# Patient Record
Sex: Female | Born: 1960 | Race: White | Hispanic: No | Marital: Married | Smoking: Former smoker
Health system: Southern US, Community
[De-identification: ages and names within clinical notes are randomized; demographics above are authoritative.]

## PROBLEM LIST (undated history)

## (undated) DIAGNOSIS — C801 Malignant (primary) neoplasm, unspecified: Secondary | ICD-10-CM

## (undated) DIAGNOSIS — R1030 Lower abdominal pain, unspecified: Secondary | ICD-10-CM

## (undated) DIAGNOSIS — Z87442 Personal history of urinary calculi: Secondary | ICD-10-CM

## (undated) DIAGNOSIS — K219 Gastro-esophageal reflux disease without esophagitis: Secondary | ICD-10-CM

## (undated) DIAGNOSIS — F419 Anxiety disorder, unspecified: Secondary | ICD-10-CM

## (undated) DIAGNOSIS — M069 Rheumatoid arthritis, unspecified: Secondary | ICD-10-CM

## (undated) DIAGNOSIS — R399 Unspecified symptoms and signs involving the genitourinary system: Secondary | ICD-10-CM

## (undated) HISTORY — PX: CHOLECYSTECTOMY: SHX55

## (undated) HISTORY — PX: ABDOMINAL HYSTERECTOMY: SHX81

## (undated) HISTORY — DX: Rheumatoid arthritis, unspecified: M06.9

## (undated) HISTORY — PX: REVISION UROSTOMY CUTANEOUS: SUR1282

---

## 2014-05-13 ENCOUNTER — Emergency Department
Admit: 2014-05-13 | Disposition: A | Discharge status: Home / Self Care | Payer: Self-pay | Admitting: Emergency Medicine

## 2014-05-13 LAB — COMPREHENSIVE METABOLIC PANEL
ANION GAP: 9 (ref 7–16)
Albumin: 4.7 g/dL
Alkaline Phosphatase: 100 U/L
BILIRUBIN TOTAL: 0.4 mg/dL
BUN: 13 mg/dL
CALCIUM: 9.7 mg/dL
CHLORIDE: 101 mmol/L
CREATININE: 0.74 mg/dL
Co2: 32 mmol/L
EGFR (Non-African Amer.): >60
Glucose: 129 mg/dL — ABNORMAL HIGH
POTASSIUM: 3.8 mmol/L
SGOT(AST): 17 U/L
SGPT (ALT): 14 U/L
Sodium: 142 mmol/L
Total Protein: 7.8 g/dL

## 2014-05-13 LAB — URINALYSIS, COMPLETE
BILIRUBIN, UR: NEGATIVE
Blood: NEGATIVE
Glucose,UR: NEGATIVE mg/dL (ref 0–75)
Leukocyte Esterase: NEGATIVE
Nitrite: NEGATIVE
Ph: 9 (ref 4.5–8.0)
Protein: 30
Specific Gravity: 1.018 (ref 1.003–1.030)

## 2014-05-13 LAB — CBC
HCT: 46.3 % (ref 35.0–47.0)
HGB: 15.8 g/dL (ref 12.0–16.0)
MCH: 29.9 pg (ref 26.0–34.0)
MCHC: 34 g/dL (ref 32.0–36.0)
MCV: 88 fL (ref 80–100)
PLATELETS: 207 10*3/uL (ref 150–440)
RBC: 5.26 10*6/uL — AB (ref 3.80–5.20)
RDW: 13.8 % (ref 11.5–14.5)
WBC: 6.9 10*3/uL (ref 3.6–11.0)

## 2014-05-13 LAB — LIPASE, BLOOD: Lipase: 32 U/L

## 2015-11-11 DIAGNOSIS — M059 Rheumatoid arthritis with rheumatoid factor, unspecified: Secondary | ICD-10-CM | POA: Insufficient documentation

## 2015-11-11 DIAGNOSIS — Z79899 Other long term (current) drug therapy: Secondary | ICD-10-CM | POA: Insufficient documentation

## 2016-02-17 DIAGNOSIS — M059 Rheumatoid arthritis with rheumatoid factor, unspecified: Secondary | ICD-10-CM | POA: Diagnosis not present

## 2016-02-17 DIAGNOSIS — Z79899 Other long term (current) drug therapy: Secondary | ICD-10-CM | POA: Diagnosis not present

## 2016-03-12 DIAGNOSIS — Z79899 Other long term (current) drug therapy: Secondary | ICD-10-CM | POA: Diagnosis not present

## 2016-03-12 DIAGNOSIS — M059 Rheumatoid arthritis with rheumatoid factor, unspecified: Secondary | ICD-10-CM | POA: Diagnosis not present

## 2016-04-29 DIAGNOSIS — N390 Urinary tract infection, site not specified: Secondary | ICD-10-CM | POA: Diagnosis not present

## 2016-04-29 DIAGNOSIS — N202 Calculus of kidney with calculus of ureter: Secondary | ICD-10-CM | POA: Diagnosis not present

## 2016-06-14 DIAGNOSIS — Z79899 Other long term (current) drug therapy: Secondary | ICD-10-CM | POA: Diagnosis not present

## 2016-06-14 DIAGNOSIS — M059 Rheumatoid arthritis with rheumatoid factor, unspecified: Secondary | ICD-10-CM | POA: Diagnosis not present

## 2016-09-13 DIAGNOSIS — Z79899 Other long term (current) drug therapy: Secondary | ICD-10-CM | POA: Diagnosis not present

## 2016-09-13 DIAGNOSIS — M059 Rheumatoid arthritis with rheumatoid factor, unspecified: Secondary | ICD-10-CM | POA: Diagnosis not present

## 2016-09-27 DIAGNOSIS — R748 Abnormal levels of other serum enzymes: Secondary | ICD-10-CM | POA: Diagnosis not present

## 2016-10-15 DIAGNOSIS — Z79899 Other long term (current) drug therapy: Secondary | ICD-10-CM | POA: Diagnosis not present

## 2016-10-15 DIAGNOSIS — M059 Rheumatoid arthritis with rheumatoid factor, unspecified: Secondary | ICD-10-CM | POA: Diagnosis not present

## 2016-12-28 DIAGNOSIS — M059 Rheumatoid arthritis with rheumatoid factor, unspecified: Secondary | ICD-10-CM | POA: Diagnosis not present

## 2016-12-28 DIAGNOSIS — Z79899 Other long term (current) drug therapy: Secondary | ICD-10-CM | POA: Diagnosis not present

## 2017-01-14 DIAGNOSIS — M059 Rheumatoid arthritis with rheumatoid factor, unspecified: Secondary | ICD-10-CM | POA: Diagnosis not present

## 2017-01-14 DIAGNOSIS — Z79899 Other long term (current) drug therapy: Secondary | ICD-10-CM | POA: Diagnosis not present

## 2017-03-18 DIAGNOSIS — M059 Rheumatoid arthritis with rheumatoid factor, unspecified: Secondary | ICD-10-CM | POA: Diagnosis not present

## 2017-03-18 DIAGNOSIS — Z79899 Other long term (current) drug therapy: Secondary | ICD-10-CM | POA: Diagnosis not present

## 2017-05-15 DIAGNOSIS — Z79899 Other long term (current) drug therapy: Secondary | ICD-10-CM | POA: Diagnosis not present

## 2017-05-15 DIAGNOSIS — M059 Rheumatoid arthritis with rheumatoid factor, unspecified: Secondary | ICD-10-CM | POA: Diagnosis not present

## 2017-06-17 DIAGNOSIS — Z79899 Other long term (current) drug therapy: Secondary | ICD-10-CM | POA: Diagnosis not present

## 2017-06-17 DIAGNOSIS — H04123 Dry eye syndrome of bilateral lacrimal glands: Secondary | ICD-10-CM | POA: Diagnosis not present

## 2017-08-05 DIAGNOSIS — M059 Rheumatoid arthritis with rheumatoid factor, unspecified: Secondary | ICD-10-CM | POA: Diagnosis not present

## 2017-08-05 DIAGNOSIS — Z79899 Other long term (current) drug therapy: Secondary | ICD-10-CM | POA: Diagnosis not present

## 2017-11-05 DIAGNOSIS — M059 Rheumatoid arthritis with rheumatoid factor, unspecified: Secondary | ICD-10-CM | POA: Diagnosis not present

## 2017-11-05 DIAGNOSIS — Z79899 Other long term (current) drug therapy: Secondary | ICD-10-CM | POA: Diagnosis not present

## 2018-11-18 ENCOUNTER — Other Ambulatory Visit: Payer: Self-pay

## 2018-11-18 ENCOUNTER — Ambulatory Visit: Payer: Commercial Managed Care - PPO | Admitting: Urology

## 2018-11-18 ENCOUNTER — Encounter: Payer: Self-pay | Admitting: Urology

## 2018-11-18 VITALS — BP 161/86 | HR 89 | Ht 63.5 in | Wt 158.0 lb

## 2018-11-18 DIAGNOSIS — R3915 Urgency of urination: Secondary | ICD-10-CM | POA: Diagnosis not present

## 2018-11-18 DIAGNOSIS — Z87891 Personal history of nicotine dependence: Secondary | ICD-10-CM

## 2018-11-18 DIAGNOSIS — R3129 Other microscopic hematuria: Secondary | ICD-10-CM

## 2018-11-18 DIAGNOSIS — N390 Urinary tract infection, site not specified: Secondary | ICD-10-CM

## 2018-11-18 LAB — MICROSCOPIC EXAMINATION: WBC, UA: >30 /hpf — AB (ref 0–5)

## 2018-11-18 LAB — URINALYSIS, COMPLETE
Bilirubin, UA: NEGATIVE
Glucose, UA: NEGATIVE
Ketones, UA: NEGATIVE
Nitrite, UA: POSITIVE — AB
Specific Gravity, UA: 1.025 (ref 1.005–1.030)
Urobilinogen, Ur: 0.2 mg/dL (ref 0.2–1.0)
pH, UA: 6 (ref 5.0–7.5)

## 2018-11-18 NOTE — Progress Notes (Signed)
11/18/2018 9:16 AM   Sarah Rush Jun 28, 1960 PM:[id number]  Referring provider: Francetta Found, [address] [place],  [place] [address]  Chief Complaint  Patient presents with  . Hematuria    HPI: 58 year old female referred for further evaluation of microscopic hematuria.  She was seen and evaluated in July (walk in clinic, [place]) at which time she was noted to have microscopic hematuria which greater than 50 red blood cells per high-powered field.  Urine culture was negative.  On this occasion, she was having dysuria urgency and frequency.  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  Her symptoms improved with abx.  She was again seen and evaluated by OB/GYN in 10/09/2018 at which time her urine was highly suspicious for urinary tract infection with positive nitrites, 10-50 white blood cells per high-power field moderate bacteria and red blood cells.  Her urine culture again was negative and she was treated with Macrobid for presumed infection.  At the time, she is having severe burning, urgency, incontinence, pelvic pressure.  She does report that she has similar episode about 3 years ago.  She was told that she may have a kidney stone but no imaging was performed.  At that time, she was having some right flank pain.  It resolved spontaneously.  She was given a strainer and saw some crystals past but no overt stones.  Today, she is symptomatic.  She reports that she improved with her symptoms after taking the Macrobid for about 4 days but then her symptoms returned.  Today she is feeling pressure, bladder spasms, urinary urgency and some terminal dysuria.   No alleviating or exacerbating symptoms.  She has seen intermittent episodes of gross hematuria associated with each of these episodes.  She denies any associated fevers or chills.  No associated flank pain.  She is postmenopausal.  She is not sexually active.  She does have an extensive  smoking history.  She stopped 7 years ago but smoked a pack a day from adolescence until she quit.  At baseline, she has minimal to no urinary symptoms.   PMH: Past Medical History:  Diagnosis Date  . Rheumatoid arthritis [facility])     Surgical History: Past Surgical History:  Procedure Laterality Date  . CESAREAN SECTION    . CHOLECYSTECTOMY      Home Medications:  Allergies as of 11/18/2018   No Known Allergies     Medication List       Accurate as of November 18, 2018  9:16 AM. If you have any questions, ask your nurse or doctor.        folic acid 1 MG tablet Commonly known as: FOLVITE   hydroxychloroquine 200 MG tablet Commonly known as: PLAQUENIL TK 1 T PO  BID   methotrexate 2.5 MG tablet Commonly known as: RHEUMATREX TK 4 TS PO 1 TIME A WK FOR 12 WKS       Allergies: No Known Allergies  Family History: History reviewed. No pertinent family history.  Social History:  reports that she has never smoked. She has never used smokeless tobacco. She reports previous alcohol use. No history on file for drug.  ROS: UROLOGY Frequent Urination?: Yes Hard to postpone urination?: Yes Burning/pain with urination?: Yes Get up at night to urinate?: Yes Leakage of urine?: Yes Urine stream starts and stops?: No Trouble starting stream?: No Do you have to strain to urinate?: No Blood in urine?: Yes Urinary tract infection?: Yes Sexually transmitted disease?: No  Injury to kidneys or bladder?: No Painful intercourse?: No Weak stream?: No Currently pregnant?: No Vaginal bleeding?: No Last menstrual period?: n  Gastrointestinal Nausea?: No Vomiting?: No Indigestion/heartburn?: No Diarrhea?: No Constipation?: No  Constitutional Fever: No Night sweats?: No Weight loss?: No Fatigue?: No  Skin Skin rash/lesions?: No Itching?: No  Eyes Blurred vision?: No Double vision?: No  Ears/Nose/Throat Sore throat?: No Sinus problems?: No   Hematologic/Lymphatic Swollen glands?: No Easy bruising?: No  Cardiovascular Leg swelling?: No Chest pain?: No  Respiratory Cough?: No Shortness of breath?: No  Endocrine Excessive thirst?: No  Musculoskeletal Back pain?: No Joint pain?: No  Neurological Headaches?: No Dizziness?: No  Psychologic Depression?: No Anxiety?: Yes  Physical Exam: BP (!) 161/86   Pulse 89   Ht 5' 3.5" (1.613 m)   Wt 158 lb (71.7 kg)   BMI 27.55 kg/m   Constitutional:  Alert and oriented, No acute distress. HEENT: [place] AT, moist mucus membranes.  Trachea midline, no masses. Cardiovascular: No clubbing, cyanosis, or edema. Respiratory: Normal respiratory effort, no increased work of breathing. Skin: No rashes, bruises or suspicious lesions. Neurologic: Grossly intact, no focal deficits, moving all 4 extremities. Psychiatric: Normal mood and affect.  Laboratory Data: Creatinine 0.8 on 09/2018  Urinalysis Multiple urinalysis/urine cultures from care everywhere reviewed today.  In addition to above, urinalysis today is grossly positive for infection including nitrates, leukocyte esterase, blood, and many bacteria.  See epic for details.  Pertinent Imaging: n/a  Assessment & Plan:    1. Microscopic hematuria Documented gross and microscopic hematuria in the absence of infection back in June as well as an extensive smoking history.  As such, strongly recommended to complete hematuria evaluation including CT urogram and cystoscopy, possibly urine cytology.  Differential diagnosis was discussed.  All questions were answered.  She is agreeable this plan. - Urinalysis, Complete - CT HEMATURIA WORKUP; Future  2. Recurrent urinary tract infection Multiple episodes of symptomatic presumed urinary tract infections although cultures have not resulted-we will also send off Ureaplasma mycoplasma given failure to grow bacteria in the past Based on her symptoms and urinalysis today, she is frankly  infected Given that her symptoms are not currently severe, will hold off on treating until we have urine culture data available She will call us and let us know if her symptoms worsen in the meantime Warning symptoms reviewed In addition to the above, which recommend cranberry tablets as well as probiotics for UTI prevention We will perform pelvic exam at the time of cystoscopy, consider possible topical estrogen cream for recurrent infections  - CULTURE, URINE COMPREHENSIVE  3. Urinary urgency Likely secondary to infection, assess whether symptoms resolved with treatment  4. 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  Hollice Espy, MD  [facility] [address] [place], [place] [address] [phone]

## 2018-11-21 LAB — CULTURE, URINE COMPREHENSIVE

## 2018-11-24 ENCOUNTER — Telehealth: Payer: Self-pay | Admitting: *Deleted

## 2018-11-24 DIAGNOSIS — N390 Urinary tract infection, site not specified: Secondary | ICD-10-CM

## 2018-11-24 LAB — MYCOPLASMA / UREAPLASMA CULTURE
Mycoplasma hominis Culture: NEGATIVE
Ureaplasma urealyticum: NEGATIVE

## 2018-11-24 NOTE — Telephone Encounter (Addendum)
Informed patient-verbalized understanding. Made appointment for cath UA.   ----- Message from Hollice Espy, MD sent at 11/24/2018  7:52 AM EDT ----- Your urine culture grew only mixed flora which is likely contaminant.  I think it is really important that we figure out these are infections versus some other process that is going on.  I would like you to come in sometime this week for a catheterized urine specimen which will eliminate the risk of contamination.  If this culture is also negative, I think we should pursue additional work-up with imaging and cystoscopy.  Hollice Espy, MD

## 2018-11-27 ENCOUNTER — Ambulatory Visit (INDEPENDENT_AMBULATORY_CARE_PROVIDER_SITE_OTHER): Payer: Commercial Managed Care - PPO

## 2018-11-27 ENCOUNTER — Other Ambulatory Visit: Payer: Self-pay

## 2018-11-27 DIAGNOSIS — N390 Urinary tract infection, site not specified: Secondary | ICD-10-CM

## 2018-11-27 LAB — URINALYSIS, COMPLETE
Bilirubin, UA: NEGATIVE
Glucose, UA: NEGATIVE
Ketones, UA: NEGATIVE
Nitrite, UA: POSITIVE — AB
Protein,UA: NEGATIVE
Specific Gravity, UA: 1.02 (ref 1.005–1.030)
Urobilinogen, Ur: 0.2 mg/dL (ref 0.2–1.0)
pH, UA: 5.5 (ref 5.0–7.5)

## 2018-11-27 LAB — MICROSCOPIC EXAMINATION: RBC: >30 /hpf — AB (ref 0–2)

## 2018-11-27 NOTE — Progress Notes (Signed)
In and Out Catheterization  Patient is present today for a I & O catheterization due to follow up after infection. Patient was cleaned and prepped in a sterile fashion with betadine and Lidocaine 2% jelly was instilled into the urethra.  A 14FR cath was inserted no complications were noted , 29ml of urine return was noted, urine was yellow in color. A clean urine sample was collected for UA. Bladder was drained  And catheter was removed with out difficulty.    Preformed by: Fonnie Jarvis, CMA  Follow up/ Additional notes: UA today is grossly positive for infection will culture

## 2018-12-02 LAB — CULTURE, URINE COMPREHENSIVE

## 2018-12-04 ENCOUNTER — Other Ambulatory Visit: Payer: Self-pay

## 2018-12-04 ENCOUNTER — Ambulatory Visit
Admission: RE | Admit: 2018-12-04 | Discharge: 2018-12-04 | Disposition: A | Discharge status: Home / Self Care | Payer: Commercial Managed Care - PPO | Source: Ambulatory Visit | Attending: Urology | Admitting: Urology

## 2018-12-04 DIAGNOSIS — R3129 Other microscopic hematuria: Secondary | ICD-10-CM | POA: Insufficient documentation

## 2018-12-04 LAB — POCT I-STAT CREATININE: Creatinine, Ser: 0.9 mg/dL (ref 0.44–1.00)

## 2018-12-04 MED ORDER — IOHEXOL 300 MG/ML  SOLN
125.0000 mL | Freq: Once | INTRAMUSCULAR | Status: AC | PRN
  Administered 2018-12-04: 125 mL via INTRAVENOUS

## 2018-12-05 ENCOUNTER — Telehealth: Payer: Self-pay | Admitting: Urology

## 2018-12-05 NOTE — Telephone Encounter (Signed)
Call patient discuss CT report.  She is a large bladder tumor ~3 cm, partially calcified.  Rather than wait for her to come to the office for cystoscopy, and like to go ahead and get her booked for the operating room for TURBT.  We discussed the CT scan findings at length.  All of her questions were answered.  She is agreeable to proceed with TURBT, possible left retrograde pyelogram and possible left ureteral stent given the proximity to the left UO.  This may or may not be needed once the tumor is visualized intraoperatively.  In addition to this, I recommended a dose of postoperative gemcitabine to reduce the risk of recurrence.  Risk including bleeding, infection, damage surrounding structures, bladder perforation amongst others were all discussed.  All questions answered.  She is willing to proceed.

## 2018-12-08 ENCOUNTER — Other Ambulatory Visit: Payer: Self-pay | Admitting: Radiology

## 2018-12-08 DIAGNOSIS — D494 Neoplasm of unspecified behavior of bladder: Secondary | ICD-10-CM

## 2018-12-08 MED ORDER — SODIUM CHLORIDE 0.9 % IR SOLN: 2000.0000 mg | Freq: Once | Status: DC

## 2018-12-12 ENCOUNTER — Other Ambulatory Visit: Payer: Self-pay

## 2018-12-12 ENCOUNTER — Other Ambulatory Visit: Payer: Commercial Managed Care - PPO

## 2018-12-12 DIAGNOSIS — N390 Urinary tract infection, site not specified: Secondary | ICD-10-CM

## 2018-12-12 LAB — URINALYSIS, COMPLETE
Bilirubin, UA: NEGATIVE
Glucose, UA: NEGATIVE
Ketones, UA: NEGATIVE
Nitrite, UA: POSITIVE — AB
Specific Gravity, UA: >1.03 — ABNORMAL HIGH (ref 1.005–1.030)
Urobilinogen, Ur: 1 mg/dL (ref 0.2–1.0)
pH, UA: 5.5 (ref 5.0–7.5)

## 2018-12-12 LAB — MICROSCOPIC EXAMINATION: RBC: >30 /hpf — AB (ref 0–2)

## 2018-12-15 ENCOUNTER — Encounter
Admission: RE | Admit: 2018-12-15 | Discharge: 2018-12-15 | Disposition: A | Discharge status: Home / Self Care | Payer: Commercial Managed Care - PPO | Source: Ambulatory Visit | Attending: Urology | Admitting: Urology

## 2018-12-15 ENCOUNTER — Other Ambulatory Visit: Payer: Self-pay

## 2018-12-15 DIAGNOSIS — Z01810 Encounter for preprocedural cardiovascular examination: Secondary | ICD-10-CM | POA: Insufficient documentation

## 2018-12-15 HISTORY — DX: Gastro-esophageal reflux disease without esophagitis: K21.9

## 2018-12-15 HISTORY — DX: Unspecified symptoms and signs involving the genitourinary system: R39.9

## 2018-12-15 HISTORY — DX: Lower abdominal pain, unspecified: R10.30

## 2018-12-15 NOTE — Patient Instructions (Addendum)
Your procedure is scheduled on: 12/22/2018 Mon Report to Same Day Surgery 2nd floor medical mall [facility] Entrance-take elevator on left to 2nd floor.  Check in with surgery information desk.) To find out your arrival time please call [phone] between 1PM - 3PM on 12/19/2018 Fri  Remember: Instructions that are not followed completely may result in serious medical risk, up to and including death, or upon the discretion of your surgeon and anesthesiologist your surgery may need to be rescheduled.    _x___ 1. Do not eat food after midnight the night before your procedure. You may drink clear liquids up to 2 hours before you are scheduled to arrive at the hospital for your procedure.  Do not drink clear liquids within 2 hours of your scheduled arrival to the hospital.  Clear liquids include  --Water or Apple juice without pulp  --Clear carbohydrate beverage such as ClearFast or Gatorade  --Black Coffee or Clear Tea (No milk, no creamers, do not add anything to                  the coffee or Tea Type 1 and type 2 diabetics should only drink water.   ____Ensure clear carbohydrate drink on the way to the hospital for bariatric patients  ____Ensure clear carbohydrate drink 3 hours before surgery.   No gum chewing or hard candies.     __x__ 2. No Alcohol for 24 hours before or after surgery.   __x__3. No Smoking or e-cigarettes for 24 prior to surgery.  Do not use any chewable tobacco products for at least 6 hour prior to surgery   ____  4. Bring all medications with you on the day of surgery if instructed.    __x__ 5. Notify your doctor if there is any change in your medical condition     (cold, fever, infections).    x___6. On the morning of surgery brush your teeth with toothpaste and water.  You may rinse your mouth with mouth wash if you wish.  Do not swallow any toothpaste or mouthwash.   Do not wear jewelry, make-up, hairpins, clips or nail polish.  Do not wear lotions,  powders, or perfumes. You may wear deodorant.  Do not shave 48 hours prior to surgery. Men may shave face and neck.  Do not bring valuables to the hospital.    [facility] is not responsible for any belongings or valuables.               Contacts, dentures or bridgework may not be worn into surgery.  Leave your suitcase in the car. After surgery it may be brought to your room.  For patients admitted to the hospital, discharge time is determined by your                       treatment team.  _  Patients discharged the day of surgery will not be allowed to drive home.  You will need someone to drive you home and stay with you the night of your procedure.    Please read over the following fact sheets that you were given:   [facility] Preparing for Surgery and or MRSA Information   _x___ Take anti-hypertensive listed below, cardiac, seizure, asthma,     anti-reflux and psychiatric medicines. These include:  1. omeprazole (PRILOSEC) 20 MG capsule  2.Follow surgeons instructions regarding plaquenil  3.  4.  5.  6.  ____Fleets enema or Magnesium Citrate as  directed.   ____ Use CHG Soap or sage wipes as directed on instruction sheet   ____ Use inhalers on the day of surgery and bring to hospital day of surgery  ____ Stop Metformin and Janumet 2 days prior to surgery.    ____ Take 1/2 of usual insulin dose the night before surgery and none on the morning     surgery.   _x___ Follow recommendations from Cardiologist, Pulmonologist or PCP regarding          stopping Aspirin, Coumadin, Plavix ,Eliquis, Effient, or Pradaxa, and Pletal.  X____Stop Anti-inflammatories such as Advil, Aleve, Ibuprofen, Motrin, Naproxen, Naprosyn, Goodies powders or aspirin products. OK to take Tylenol and                          Celebrex.   _x___ Stop supplements until after surgery.  But may continue Vitamin D, Vitamin B,       and multivitamin.   ____ Bring C-Pap to the hospital.

## 2018-12-16 ENCOUNTER — Other Ambulatory Visit: Payer: Commercial Managed Care - PPO | Admitting: Urology

## 2018-12-17 LAB — CULTURE, URINE COMPREHENSIVE

## 2018-12-18 ENCOUNTER — Other Ambulatory Visit: Payer: Self-pay

## 2018-12-18 ENCOUNTER — Other Ambulatory Visit
Admission: RE | Admit: 2018-12-18 | Discharge: 2018-12-18 | Disposition: A | Discharge status: Home / Self Care | Payer: Commercial Managed Care - PPO | Source: Ambulatory Visit | Attending: Urology | Admitting: Urology

## 2018-12-18 DIAGNOSIS — Z20828 Contact with and (suspected) exposure to other viral communicable diseases: Secondary | ICD-10-CM | POA: Diagnosis not present

## 2018-12-18 DIAGNOSIS — Z01812 Encounter for preprocedural laboratory examination: Secondary | ICD-10-CM | POA: Insufficient documentation

## 2018-12-18 LAB — SARS CORONAVIRUS 2 (TAT 6-24 HRS): SARS Coronavirus 2: NEGATIVE

## 2018-12-22 ENCOUNTER — Ambulatory Visit: Payer: Commercial Managed Care - PPO | Admitting: Anesthesiology

## 2018-12-22 ENCOUNTER — Ambulatory Visit
Admission: RE | Admit: 2018-12-22 | Discharge: 2018-12-22 | Disposition: A | Discharge status: Home / Self Care | Payer: Commercial Managed Care - PPO | Attending: Urology | Admitting: Urology

## 2018-12-22 ENCOUNTER — Encounter
Admission: RE | Disposition: A | Discharge status: Home / Self Care | Payer: Self-pay | Source: Home / Self Care | Attending: Urology

## 2018-12-22 ENCOUNTER — Encounter: Payer: Self-pay | Admitting: *Deleted

## 2018-12-22 ENCOUNTER — Other Ambulatory Visit: Payer: Self-pay

## 2018-12-22 ENCOUNTER — Ambulatory Visit: Payer: Commercial Managed Care - PPO

## 2018-12-22 DIAGNOSIS — Z87440 Personal history of urinary (tract) infections: Secondary | ICD-10-CM | POA: Insufficient documentation

## 2018-12-22 DIAGNOSIS — R3915 Urgency of urination: Secondary | ICD-10-CM | POA: Diagnosis not present

## 2018-12-22 DIAGNOSIS — M069 Rheumatoid arthritis, unspecified: Secondary | ICD-10-CM | POA: Diagnosis not present

## 2018-12-22 DIAGNOSIS — N3031 Trigonitis with hematuria: Secondary | ICD-10-CM | POA: Diagnosis not present

## 2018-12-22 DIAGNOSIS — D494 Neoplasm of unspecified behavior of bladder: Secondary | ICD-10-CM

## 2018-12-22 DIAGNOSIS — Z87891 Personal history of nicotine dependence: Secondary | ICD-10-CM | POA: Diagnosis not present

## 2018-12-22 DIAGNOSIS — N3289 Other specified disorders of bladder: Secondary | ICD-10-CM | POA: Diagnosis not present

## 2018-12-22 DIAGNOSIS — C679 Malignant neoplasm of bladder, unspecified: Secondary | ICD-10-CM | POA: Insufficient documentation

## 2018-12-22 DIAGNOSIS — D090 Carcinoma in situ of bladder: Secondary | ICD-10-CM | POA: Insufficient documentation

## 2018-12-22 DIAGNOSIS — Z79899 Other long term (current) drug therapy: Secondary | ICD-10-CM | POA: Diagnosis not present

## 2018-12-22 DIAGNOSIS — D490 Neoplasm of unspecified behavior of digestive system: Secondary | ICD-10-CM | POA: Diagnosis not present

## 2018-12-22 HISTORY — PX: CYSTOSCOPY WITH BIOPSY: SHX5122

## 2018-12-22 HISTORY — PX: TRANSURETHRAL RESECTION OF BLADDER TUMOR WITH MITOMYCIN-C: SHX6459

## 2018-12-22 SURGERY — TRANSURETHRAL RESECTION OF BLADDER TUMOR WITH MITOMYCIN-C
Anesthesia: General | Site: Bladder

## 2018-12-22 MED ORDER — ONDANSETRON HCL 4 MG/2ML IJ SOLN
INTRAMUSCULAR | Status: DC | PRN
  Administered 2018-12-22: 4 mg via INTRAVENOUS

## 2018-12-22 MED ORDER — DEXAMETHASONE SODIUM PHOSPHATE 10 MG/ML IJ SOLN
INTRAMUSCULAR | Status: DC | PRN
  Administered 2018-12-22: 10 mg via INTRAVENOUS

## 2018-12-22 MED ORDER — FAMOTIDINE 20 MG PO TABS
20.0000 mg | ORAL_TABLET | Freq: Once | ORAL | Status: AC
  Administered 2018-12-22: 12:00:00 20 mg via ORAL

## 2018-12-22 MED ORDER — CEFAZOLIN SODIUM-DEXTROSE 2-4 GM/100ML-% IV SOLN
INTRAVENOUS | Status: AC
  Filled 2018-12-22: qty 100

## 2018-12-22 MED ORDER — FENTANYL CITRATE (PF) 100 MCG/2ML IJ SOLN
INTRAMUSCULAR | Status: AC
  Filled 2018-12-22: qty 2

## 2018-12-22 MED ORDER — KETOROLAC TROMETHAMINE 30 MG/ML IJ SOLN
INTRAMUSCULAR | Status: DC | PRN
  Administered 2018-12-22: 30 mg via INTRAVENOUS

## 2018-12-22 MED ORDER — DOCUSATE SODIUM 100 MG PO CAPS: 100.0000 mg | ORAL_CAPSULE | Freq: Two times a day (BID) | ORAL | 0 refills | Status: DC

## 2018-12-22 MED ORDER — FENTANYL CITRATE (PF) 100 MCG/2ML IJ SOLN
INTRAMUSCULAR | Status: DC | PRN
  Administered 2018-12-22 (×2): 50 ug via INTRAVENOUS
  Administered 2018-12-22: 25 ug via INTRAVENOUS
  Administered 2018-12-22: 50 ug via INTRAVENOUS
  Administered 2018-12-22: 25 ug via INTRAVENOUS

## 2018-12-22 MED ORDER — SUCCINYLCHOLINE CHLORIDE 20 MG/ML IJ SOLN
INTRAMUSCULAR | Status: DC | PRN
  Administered 2018-12-22: 100 mg via INTRAVENOUS

## 2018-12-22 MED ORDER — PROMETHAZINE HCL 25 MG/ML IJ SOLN: 6.2500 mg | INTRAMUSCULAR | Status: DC | PRN

## 2018-12-22 MED ORDER — FAMOTIDINE 20 MG PO TABS
ORAL_TABLET | ORAL | Status: AC
  Administered 2018-12-22: 12:00:00 20 mg via ORAL
  Filled 2018-12-22: qty 1

## 2018-12-22 MED ORDER — OXYCODONE HCL 5 MG/5ML PO SOLN: 5.0000 mg | Freq: Once | ORAL | Status: DC | PRN

## 2018-12-22 MED ORDER — SUGAMMADEX SODIUM 200 MG/2ML IV SOLN
INTRAVENOUS | Status: AC
  Filled 2018-12-22: qty 2

## 2018-12-22 MED ORDER — OXYCODONE HCL 5 MG PO TABS: 5.0000 mg | ORAL_TABLET | Freq: Once | ORAL | Status: DC | PRN

## 2018-12-22 MED ORDER — FENTANYL CITRATE (PF) 100 MCG/2ML IJ SOLN
25.0000 ug | INTRAMUSCULAR | Status: DC | PRN
  Administered 2018-12-22 (×4): 25 ug via INTRAVENOUS

## 2018-12-22 MED ORDER — PHENYLEPHRINE HCL (PRESSORS) 10 MG/ML IV SOLN
INTRAVENOUS | Status: DC | PRN
  Administered 2018-12-22: 150 ug via INTRAVENOUS
  Administered 2018-12-22: 100 ug via INTRAVENOUS
  Administered 2018-12-22: 150 ug via INTRAVENOUS
  Administered 2018-12-22: 100 ug via INTRAVENOUS

## 2018-12-22 MED ORDER — MIDAZOLAM HCL 2 MG/2ML IJ SOLN
INTRAMUSCULAR | Status: AC
  Filled 2018-12-22: qty 2

## 2018-12-22 MED ORDER — OXYBUTYNIN CHLORIDE 5 MG PO TABS: 5.0000 mg | ORAL_TABLET | Freq: Three times a day (TID) | ORAL | 0 refills | Status: DC | PRN

## 2018-12-22 MED ORDER — SUGAMMADEX SODIUM 200 MG/2ML IV SOLN
INTRAVENOUS | Status: DC | PRN
  Administered 2018-12-22: 142 mg via INTRAVENOUS

## 2018-12-22 MED ORDER — LACTATED RINGERS IV SOLN
INTRAVENOUS | Status: DC
  Administered 2018-12-22: 12:00:00 via INTRAVENOUS

## 2018-12-22 MED ORDER — PROPOFOL 10 MG/ML IV BOLUS
INTRAVENOUS | Status: DC | PRN
  Administered 2018-12-22: 140 mg via INTRAVENOUS

## 2018-12-22 MED ORDER — GLYCOPYRROLATE 0.2 MG/ML IJ SOLN
INTRAMUSCULAR | Status: AC
  Filled 2018-12-22: qty 1

## 2018-12-22 MED ORDER — GEMCITABINE CHEMO FOR BLADDER INSTILLATION 2000 MG
INTRAVENOUS | Status: DC | PRN
  Administered 2018-12-22: 2000 mg via INTRAVESICAL

## 2018-12-22 MED ORDER — ROCURONIUM BROMIDE 100 MG/10ML IV SOLN
INTRAVENOUS | Status: DC | PRN
  Administered 2018-12-22: 15 mg via INTRAVENOUS
  Administered 2018-12-22: 5 mg via INTRAVENOUS

## 2018-12-22 MED ORDER — PROPOFOL 10 MG/ML IV BOLUS
INTRAVENOUS | Status: AC
  Filled 2018-12-22: qty 20

## 2018-12-22 MED ORDER — GLYCOPYRROLATE 0.2 MG/ML IJ SOLN
INTRAMUSCULAR | Status: DC | PRN
  Administered 2018-12-22: .15 mg via INTRAVENOUS

## 2018-12-22 MED ORDER — CEFAZOLIN SODIUM-DEXTROSE 2-4 GM/100ML-% IV SOLN
2.0000 g | INTRAVENOUS | Status: AC
  Administered 2018-12-22: 2 g via INTRAVENOUS

## 2018-12-22 MED ORDER — MIDAZOLAM HCL 2 MG/2ML IJ SOLN
INTRAMUSCULAR | Status: DC | PRN
  Administered 2018-12-22: 2 mg via INTRAVENOUS

## 2018-12-22 MED ORDER — LIDOCAINE HCL (PF) 2 % IJ SOLN
INTRAMUSCULAR | Status: AC
  Filled 2018-12-22: qty 10

## 2018-12-22 MED ORDER — LIDOCAINE HCL (CARDIAC) PF 100 MG/5ML IV SOSY
PREFILLED_SYRINGE | INTRAVENOUS | Status: DC | PRN
  Administered 2018-12-22: 100 mg via INTRAVENOUS

## 2018-12-22 SURGICAL SUPPLY — 37 items
BAG DRAIN CYSTO-URO LG1000N (MISCELLANEOUS) ×3 IMPLANT
BAG URINE DRAIN 2000ML AR STRL (UROLOGICAL SUPPLIES) ×3 IMPLANT
BRUSH SCRUB EZ  4% CHG (MISCELLANEOUS) ×1
BRUSH SCRUB EZ 4% CHG (MISCELLANEOUS) ×2 IMPLANT
CATH FOLEY 2WAY  5CC 16FR (CATHETERS) ×1
CATH URETL 5X70 OPEN END (CATHETERS) ×3 IMPLANT
CATH URTH 16FR FL 2W BLN LF (CATHETERS) ×2 IMPLANT
CONRAY 43 FOR UROLOGY 50M (MISCELLANEOUS) ×2 IMPLANT
COVER WAND RF STERILE (DRAPES) ×2 IMPLANT
CRADLE LAMINECT ARM (MISCELLANEOUS) ×3 IMPLANT
DRAPE UTILITY 15X26 TOWEL STRL (DRAPES) ×3 IMPLANT
DRSG TELFA 4X3 1S NADH ST (GAUZE/BANDAGES/DRESSINGS) ×3 IMPLANT
ELECT LOOP 22F BIPOLAR SML (ELECTROSURGICAL) ×3
ELECT REM PT RETURN 9FT ADLT (ELECTROSURGICAL)
ELECTRODE LOOP 22F BIPOLAR SML (ELECTROSURGICAL) IMPLANT
ELECTRODE REM PT RTRN 9FT ADLT (ELECTROSURGICAL) IMPLANT
GLOVE BIO SURGEON STRL SZ 6.5 (GLOVE) ×3 IMPLANT
GLOVE BIOGEL M 8.0 STRL (GLOVE) ×3 IMPLANT
GOWN STRL REUS W/ TWL LRG LVL3 (GOWN DISPOSABLE) ×4 IMPLANT
GOWN STRL REUS W/ TWL XL LVL3 (GOWN DISPOSABLE) ×2 IMPLANT
GOWN STRL REUS W/TWL LRG LVL3 (GOWN DISPOSABLE) ×2
GOWN STRL REUS W/TWL XL LVL3 (GOWN DISPOSABLE) ×1
GUIDEWIRE STR DUAL SENSOR (WIRE) ×3 IMPLANT
KIT TURNOVER CYSTO (KITS) ×3 IMPLANT
LOOP CUT BIPOLAR 24F LRG (ELECTROSURGICAL) IMPLANT
NDL SAFETY ECLIPSE 18X1.5 (NEEDLE) ×2 IMPLANT
NEEDLE HYPO 18GX1.5 SHARP (NEEDLE)
PACK CYSTO AR (MISCELLANEOUS) ×3 IMPLANT
SET CYSTO W/LG BORE CLAMP LF (SET/KITS/TRAYS/PACK) ×3 IMPLANT
SET IRRIG Y TYPE TUR BLADDER L (SET/KITS/TRAYS/PACK) ×3 IMPLANT
SOL .9 NS 3000ML IRR  AL (IV SOLUTION) ×5
SOL .9 NS 3000ML IRR UROMATIC (IV SOLUTION) ×2 IMPLANT
STENT URET 6FRX24 CONTOUR (STENTS) ×2 IMPLANT
STENT URET 6FRX26 CONTOUR (STENTS) ×2 IMPLANT
SURGILUBE 2OZ TUBE FLIPTOP (MISCELLANEOUS) ×3 IMPLANT
SYRINGE IRR TOOMEY STRL 70CC (SYRINGE) ×3 IMPLANT
WATER STERILE IRR 1000ML POUR (IV SOLUTION) ×3 IMPLANT

## 2018-12-22 NOTE — Discharge Instructions (Signed)
Transurethral Resection of Bladder Tumor (TURBT) or Bladder Biopsy   Definition:  Transurethral Resection of the Bladder Tumor is a surgical procedure used to diagnose and remove tumors within the bladder. TURBT is the most common treatment for early stage bladder cancer.  General instructions:     Your recent bladder surgery requires very little post hospital care but some definite precautions.  Despite the fact that no skin incisions were used, the area around the bladder incisions are raw and covered with scabs to promote healing and prevent bleeding. Certain precautions are needed to insure that the scabs are not disturbed over the next 2-4 weeks while the healing proceeds.  Because the raw surface inside your bladder and the irritating effects of urine you may expect frequency of urination and/or urgency (a stronger desire to urinate) and perhaps even getting up at night more often. This will usually resolve or improve slowly over the healing period. You may see some blood in your urine over the first 6 weeks. Do not be alarmed, even if the urine was clear for a while. Get off your feet and drink lots of fluids until clearing occurs. If you start to pass clots or don't improve call us.  Diet:  You may return to your normal diet immediately. Because of the raw surface of your bladder, alcohol, spicy foods, foods high in acid and drinks with caffeine may cause irritation or frequency and should be used in moderation. To keep your urine flowing freely and avoid constipation, drink plenty of fluids during the day (8-10 glasses). Tip: Avoid cranberry juice because it is very acidic.  Activity:  Your physical activity doesn't need to be restricted. However, if you are very active, you may see some blood in the urine. We suggest that you reduce your activity under the circumstances until the bleeding has stopped.  Bowels:  It is important to keep your bowels regular during the postoperative  period. Straining with bowel movements can cause bleeding. A bowel movement every other day is reasonable. Use a mild laxative if needed, such as milk of magnesia 2-3 tablespoons, or 2 Dulcolax tablets. Call if you continue to have problems. If you had been taking narcotics for pain, before, during or after your surgery, you may be constipated. Take a laxative if necessary.    Medication:  You should resume your pre-surgery medications unless told not to. In addition you may be given an antibiotic to prevent or treat infection. Antibiotics are not always necessary. All medication should be taken as prescribed until the bottles are finished unless you are having an unusual reaction to one of the drugs.   [place], [place] [address] [phone]   AMBULATORY SURGERY  DISCHARGE INSTRUCTIONS   1) The drugs that you were given will stay in your system until tomorrow so for the next 24 hours you should not:  A) Drive an automobile B) Make any legal decisions C) Drink any alcoholic beverage   2) You may resume regular meals tomorrow.  Today it is better to start with liquids and gradually work up to solid foods.  You may eat anything you prefer, but it is better to start with liquids, then soup and crackers, and gradually work up to solid foods.   3) Please notify your doctor immediately if you have any unusual bleeding, trouble breathing, redness and pain at the surgery site, drainage, fever, or pain not relieved by medication.    Please contact your physician with any  problems or Same Day Surgery at [phone], Monday through Friday 6 am to 4 pm, or [facility] number at [phone].

## 2018-12-22 NOTE — Anesthesia Preprocedure Evaluation (Addendum)
Anesthesia Evaluation  Patient identified by MRN, date of birth, ID band Patient awake    Reviewed: Allergy & Precautions, H&P , NPO status , Patient's Chart, lab work & pertinent test results  Airway Mallampati: II  TM Distance: >3 FB Neck ROM: full    Dental  (+) Edentulous Lower, Edentulous Upper   Pulmonary neg COPD, former smoker,           Cardiovascular (-) angina(-) Past MI negative cardio ROS  (-) dysrhythmias      Neuro/Psych negative neurological ROS  negative psych ROS   GI/Hepatic Neg liver ROS, GERD  Controlled,  Endo/Other  negative endocrine ROS  Renal/GU      Musculoskeletal  (+) Arthritis , Rheumatoid disorders,    Abdominal   Peds  Hematology negative hematology ROS (+)   Anesthesia Other Findings Past Medical History: No date: GERD (gastroesophageal reflux disease) No date: Lower abdominal pain No date: Rheumatoid arthritis (HCC) No date: Urinary tract infection symptoms  Past Surgical History: No date: CESAREAN SECTION No date: CHOLECYSTECTOMY     Reproductive/Obstetrics negative OB ROS                            Anesthesia Physical Anesthesia Plan  ASA: II  Anesthesia Plan: General ETT   Post-op Pain Management:    Induction:   PONV Risk Score and Plan: Ondansetron, Dexamethasone, Midazolam and Treatment may vary due to age or medical condition  Airway Management Planned:   Additional Equipment:   Intra-op Plan:   Post-operative Plan:   Informed Consent: I have reviewed the patients History and Physical, chart, labs and discussed the procedure including the risks, benefits and alternatives for the proposed anesthesia with the patient or authorized representative who has indicated his/her understanding and acceptance.     Dental Advisory Given  Plan Discussed with: Anesthesiologist  Anesthesia Plan Comments:        Anesthesia Quick  Evaluation

## 2018-12-22 NOTE — Anesthesia Postprocedure Evaluation (Signed)
Anesthesia Post Note  Patient: LASHENA INCLAN  Procedure(s) Performed: TRANSURETHRAL RESECTION OF BLADDER TUMOR WITH Gemcitabine (N/A Bladder) CYSTOSCOPY WITH BIOPSY (N/A Bladder)  Patient location during evaluation: PACU Anesthesia Type: General Level of consciousness: awake and alert Pain management: pain level controlled Vital Signs Assessment: post-procedure vital signs reviewed and stable Respiratory status: spontaneous breathing, nonlabored ventilation and respiratory function stable Cardiovascular status: blood pressure returned to baseline and stable Postop Assessment: no apparent nausea or vomiting Anesthetic complications: no     Last Vitals:  Vitals:   12/22/18 1500 12/22/18 1505  BP:  (!) 146/55  Pulse: 78 78  Resp: 17 13  Temp:    SpO2: 96% 99%    Last Pain:  Vitals:   12/22/18 1505  TempSrc:   PainSc: [place]

## 2018-12-22 NOTE — Transfer of Care (Signed)
Immediate Anesthesia Transfer of Care Note  Patient: Sarah Rush  Procedure(s) Performed: TRANSURETHRAL RESECTION OF BLADDER TUMOR WITH Gemcitabine (N/A Bladder) CYSTOSCOPY WITH BIOPSY (N/A Bladder)  Patient Location: PACU  Anesthesia Type:General  Level of Consciousness: drowsy  Airway & Oxygen Therapy: Patient Spontanous Breathing and Patient connected to face mask oxygen  Post-op Assessment: Report given to RN and Post -op Vital signs reviewed and stable  Post vital signs: Reviewed and stable  Last Vitals:  Vitals Value Taken Time  BP 115/65 12/22/18 1424  Temp    Pulse 74 12/22/18 1426  Resp 13 12/22/18 1426  SpO2 100 % 12/22/18 1426  Vitals shown include unvalidated device data.  Last Pain:  Vitals:   12/22/18 1149  TempSrc: Temporal  PainSc: 0-No pain         Complications: None

## 2018-12-22 NOTE — Op Note (Addendum)
Date of procedure: 12/22/18  Preoperative diagnosis:  1. Bladder tumor, medium 3 cm  Postoperative diagnosis:  1. Same as above  Procedure: 1. TURBT, medium 2. Bladder biopsy 3. Instillation of intravesical gemcitabine  Surgeon: Hollice Espy, MD  Anesthesia: General  Complications: None  Intraoperative findings: 3 cm broad-based spherical papillary bladder tumor on the right lateral bladder wall.  No involvement of the ureteral orifice.  Papillary carpeting on on posterior bladder wall of the erythematous base concerning for an early tumor versus CIS.  EBL: Minimal  Specimens: Bladder tumor, deep bladder base, posterior bladder wall erythema  Drains: 16 French Foley catheter  Indication: Sarah Rush is a 58 y.o. patient with hematuria with CT urogram concerning for a right lateral bladder wall tumor without obvious involvement of the ureter but in close proximity.   After reviewing the management options for treatment, she elected to proceed with the above surgical procedure(s). We have discussed the potential benefits and risks of the procedure, side effects of the proposed treatment, the likelihood of the patient achieving the goals of the procedure, and any potential problems that might occur during the procedure or recuperation. Informed consent has been obtained.  Description of procedure:  The patient was taken to the operating room and general anesthesia was induced.  The patient was placed in the dorsal lithotomy position, prepped and draped in the usual sterile fashion, and preoperative antibiotics were administered. A preoperative time-out was performed.   A 26 French resectoscope using the blunt obturator was introduced per urethra into the bladder.  The bladder is carefully inspected and noted to have a large approximately 3 cm spherical bladder tumor with a rugated/papillary surface.  It was on a relatively broad base.  Both UOs were able to be visualized and  although the bladder tumor is in close proximity to the right UO, did not seem to involve it and there was clear reflux of urine from both.  Next, using a small bipolar loop, the tumor was resected.  It was relatively vascular with a solid core.  Ultimately, I was able to resect down to the base of the tumor which was actually a large tumor and a smaller satellite tumor in close proximity.  I was able to resect down to the the level which appeared to be consistent with muscularis propria.  I then used cold cup biopsy forceps to take deeper biopsies of the base however due to the vascular nature of the tumor, there was a good amount of cautery used at the base and thus I suspect there will be a lot of cautery artifact of these deep base samples.  The right UO was inspected and noted to have clear yellow urine draining in a bolus-like fashion.  Next, the remainder of the bladder was inspected.  There was an area of erythema on the posterior bladder wall slightly on the right side which had a text raised type appearance.  This is highly concerning for CIS versus early recurrence.  Cold cup biopsy forceps were used to take to biopsies of this area and the remainder of the surface was fulgurated using the bipolar loop.  There is no active residual or viable tumor remaining and hemostasis at this point was excellent.  Finally, the scope was removed and a 16 French Foley catheter was placed.  The balloon was filled with 10 cc of sterile water.    The patient was taken to the PACU in stable condition.  2000 mg of intravesical  mitomycin were instilled to the bladder and allowed to dwell for an hour.  This was well-tolerated.  After an hour, the bladder was drained and the catheter was removed.  Plan: She will follow-up with me in a few weeks.  I will call her with her pathology soon as I receive it to assess for need for further treatment/intervention.  She is agreeable this plan.  Findings were discussed with her  husband as well.  Hollice Espy, M.D.

## 2018-12-22 NOTE — Anesthesia Procedure Notes (Signed)
Procedure Name: Intubation Performed by: Demetrius Charity, CRNA Pre-anesthesia Checklist: Patient identified, Patient being monitored, Timeout performed, Emergency Drugs available and Suction available Patient Re-evaluated:Patient Re-evaluated prior to induction Oxygen Delivery Method: Circle system utilized Preoxygenation: Pre-oxygenation with 100% oxygen Induction Type: IV induction Ventilation: Mask ventilation without difficulty Laryngoscope Size: Mac and 3 Grade View: Grade I Tube type: Oral Tube size: 7.0 mm Number of attempts: 1 Airway Equipment and Method: Stylet Placement Confirmation: ETT inserted through vocal cords under direct vision,  positive ETCO2 and breath sounds checked- equal and bilateral Secured at: 21 cm Tube secured with: Tape Dental Injury: Teeth and Oropharynx as per pre-operative assessment

## 2018-12-22 NOTE — H&P (Signed)
H&P updated today on 12/22/2018 Regular rate and rhythm Clear to auscultation bilaterally  Since the below visit/note, the patient underwent CT urogram indicating a left lateral bladder wall tumor, approximately 3 cm strip partially calcified.  The ureters appear to be unobstructed.  CT was discussed at length by telephone.  She is agreeable to proceed with TURBT, possible left retrograde pyelogram and possible left ureteral stent given the proximity to the left UO.  This may or may not be needed once the tumor is visualized intraoperatively.  In addition to this, I recommended a dose of postoperative gemcitabine to reduce the risk of recurrence.  Risk including bleeding, infection, damage surrounding structures, bladder perforation amongst others were all discussed.  All questions answered.  She is willing to proceed  No other changes  Sarah Rush 13-Aug-1960 PM:[id number]  Referring provider: Francetta Found, [address] [place],  [place] [address]     Chief Complaint  Patient presents with   Hematuria    HPI: 58 year old female referred for further evaluation of microscopic hematuria.  She was seen and evaluated in July (walk in clinic, [place]) at which time she was noted to have microscopic hematuria which greater than 50 red blood cells per high-powered field.  Urine culture was negative.  On this occasion, she was having dysuria urgency and frequency.  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  Her symptoms improved with abx.  She was again seen and evaluated by OB/GYN in 10/09/2018 at which time her urine was highly suspicious for urinary tract infection with positive nitrites, 10-50 white blood cells per high-power field moderate bacteria and red blood cells.  Her urine culture again was negative and she was treated with Macrobid for presumed infection.  At the time, she is having severe burning, urgency, incontinence, pelvic  pressure.  She does report that she has similar episode about 3 years ago.  She was told that she may have a kidney stone but no imaging was performed.  At that time, she was having some right flank pain.  It resolved spontaneously.  She was given a strainer and saw some crystals past but no overt stones.  Today, she is symptomatic.  She reports that she improved with her symptoms after taking the Macrobid for about 4 days but then her symptoms returned.  Today she is feeling pressure, bladder spasms, urinary urgency and some terminal dysuria.   No alleviating or exacerbating symptoms.  She has seen intermittent episodes of gross hematuria associated with each of these episodes.  She denies any associated fevers or chills.  No associated flank pain.  She is postmenopausal.  She is not sexually active.  She does have an extensive smoking history.  She stopped 7 years ago but smoked a pack a day from adolescence until she quit.  At baseline, she has minimal to no urinary symptoms.   PMH:     Past Medical History:  Diagnosis Date   Rheumatoid arthritis ([place])     Surgical History:      Past Surgical History:  Procedure Laterality Date   CESAREAN SECTION     CHOLECYSTECTOMY      Home Medications:  Allergies as of 11/18/2018   No Known Allergies        Medication List       Accurate as of November 18, 2018  9:16 AM. If you have any questions, ask your nurse or doctor.  folic acid 1 MG tablet Commonly known as: FOLVITE   hydroxychloroquine 200 MG tablet Commonly known as: PLAQUENIL TK 1 T PO  BID   methotrexate 2.5 MG tablet Commonly known as: RHEUMATREX TK 4 TS PO 1 TIME A WK FOR 12 WKS       Allergies: No Known Allergies  Family History: History reviewed. No pertinent family history.  Social History:  reports that she has never smoked. She has never used smokeless tobacco. She reports previous alcohol use. No history on  file for drug.  ROS: UROLOGY Frequent Urination?: Yes Hard to postpone urination?: Yes Burning/pain with urination?: Yes Get up at night to urinate?: Yes Leakage of urine?: Yes Urine stream starts and stops?: No Trouble starting stream?: No Do you have to strain to urinate?: No Blood in urine?: Yes Urinary tract infection?: Yes Sexually transmitted disease?: No Injury to kidneys or bladder?: No Painful intercourse?: No Weak stream?: No Currently pregnant?: No Vaginal bleeding?: No Last menstrual period?: n  Gastrointestinal Nausea?: No Vomiting?: No Indigestion/heartburn?: No Diarrhea?: No Constipation?: No  Constitutional Fever: No Night sweats?: No Weight loss?: No Fatigue?: No  Skin Skin rash/lesions?: No Itching?: No  Eyes Blurred vision?: No Double vision?: No  Ears/Nose/Throat Sore throat?: No Sinus problems?: No  Hematologic/Lymphatic Swollen glands?: No Easy bruising?: No  Cardiovascular Leg swelling?: No Chest pain?: No  Respiratory Cough?: No Shortness of breath?: No  Endocrine Excessive thirst?: No  Musculoskeletal Back pain?: No Joint pain?: No  Neurological Headaches?: No Dizziness?: No  Psychologic Depression?: No Anxiety?: Yes  Physical Exam: BP (!) 161/86    Pulse 89    Ht 5' 3.5" (1.613 m)    Wt 158 lb (71.7 kg)    BMI 27.55 kg/m   Constitutional:  Alert and oriented, No acute distress. HEENT: [place] AT, moist mucus membranes.  Trachea midline, no masses. Cardiovascular: No clubbing, cyanosis, or edema. Respiratory: Normal respiratory effort, no increased work of breathing. Skin: No rashes, bruises or suspicious lesions. Neurologic: Grossly intact, no focal deficits, moving all 4 extremities. Psychiatric: Normal mood and affect.  Laboratory Data: Creatinine 0.8 on 09/2018  Urinalysis Multiple urinalysis/urine cultures from care everywhere reviewed today.  In addition to above, urinalysis today is  grossly positive for infection including nitrates, leukocyte esterase, blood, and many bacteria.  See epic for details.  Pertinent Imaging: n/a  Assessment & Plan:    1. Microscopic hematuria Documented gross and microscopic hematuria in the absence of infection back in June as well as an extensive smoking history.  As such, strongly recommended to complete hematuria evaluation including CT urogram and cystoscopy, possibly urine cytology.  Differential diagnosis was discussed.  All questions were answered.  She is agreeable this plan. - Urinalysis, Complete - CT HEMATURIA WORKUP; Future  2. Recurrent urinary tract infection Multiple episodes of symptomatic presumed urinary tract infections although cultures have not resulted-we will also send off Ureaplasma mycoplasma given failure to grow bacteria in the past Based on her symptoms and urinalysis today, she is frankly infected Given that her symptoms are not currently severe, will hold off on treating until we have urine culture data available She will call us and let us know if her symptoms worsen in the meantime Warning symptoms reviewed In addition to the above, which recommend cranberry tablets as well as probiotics for UTI prevention We will perform pelvic exam at the time of cystoscopy, consider possible topical estrogen cream for recurrent infections  - CULTURE, URINE COMPREHENSIVE  3. Urinary  urgency Likely secondary to infection, assess whether symptoms resolved with treatment  4. 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

## 2018-12-22 NOTE — Anesthesia Post-op Follow-up Note (Signed)
Anesthesia QCDR form completed.        

## 2018-12-23 ENCOUNTER — Encounter: Payer: Self-pay | Admitting: Urology

## 2018-12-24 LAB — SURGICAL PATHOLOGY

## 2018-12-30 ENCOUNTER — Telehealth: Payer: Self-pay | Admitting: Urology

## 2018-12-30 NOTE — Telephone Encounter (Signed)
Pt saw results on mychart and wants to speak with someone about them.

## 2018-12-30 NOTE — Telephone Encounter (Signed)
Follow up is scheduled to discuss results and treatment options.    Hollice Espy, MD

## 2018-12-30 NOTE — Telephone Encounter (Signed)
Patient was notified that her appointment next week is to discuss results

## 2019-01-02 ENCOUNTER — Encounter: Payer: Self-pay | Admitting: Urology

## 2019-01-02 ENCOUNTER — Other Ambulatory Visit: Payer: Self-pay

## 2019-01-02 ENCOUNTER — Ambulatory Visit: Payer: Commercial Managed Care - PPO | Admitting: Urology

## 2019-01-02 VITALS — BP 153/76 | HR 91 | Ht 64.0 in | Wt 155.0 lb

## 2019-01-02 DIAGNOSIS — C672 Malignant neoplasm of lateral wall of bladder: Secondary | ICD-10-CM

## 2019-01-02 NOTE — Patient Instructions (Signed)
Patient Education: (BCG) Into the Bladder (Intravesical Chemotherapy)  BCG is a vaccine which is used to prevent tuberculosis (TB).  But it's also a helpful treatment for some early bladder cancers.  When BCG goes directly into the bladder the treatment is described as intravesical.  BCG is a type of immunotherapy.  Immunotherapy stimulates the body's immune system to destroy cancer cells.  How it's given BCG treatment is given to you in an outpatient setting.  It takes a few minutes to administer and you can go home as soon as it's finished.  It might be a good idea to ask someone to bring you, particularly the fist time.  Unlike chemotherapy into the bladder, BCG treatment is never given immediately after surgery to remove bladder tumors.  There needs to be a delay usually of at least two weeks after surgery, before you can have it.  You won't be given treatment with BCG if you are unwell or have an infection in your urine.  You're usually asked to limit the amount you drink before your treatment.  This will help to increase the concentration of BCG in your bladder.  Drinking too much before your treatment may make your bladder feel uncomfortably full.  If you normally take water tablets (diuretics) take them later in the day after your treatment.  Your nurse or doctor will give you more advise about preparing for your treatment.  You will have a small tube (catheter) placed into your bladder.  Your doctor will then put the liquid vaccine directly into your bladder through the catheter and remove the catheter.  You will need to hold your urine for two hours afterwards.  This can be difficult but it's to give the treatment time to work.  You can walk around during this time.  When the treatment is over you can go to the toilet.  After your treatment there are some precautions you'll need to take.  This is because BCG is a live vaccine and other people shouldn't be exposed to it.  For the next six  hours, you'll need to avoid your urine splashing on the toilet seat and getting any urine on your hands.  It might be easer for men to sit down when they're using an ordinary toilet although using a stand up urinal should be alright.  The main this is to avoid splashing urine and spreading the vaccine.  You will also be asked to put 1/2 cup undiluted bleach into the toilet to destroy any live vaccine and leave it for 15 minutes until you flush.  Side Effects Because BCG goes directly into the bladder most of the side effects are linked with the bladder.  They usually go away within one to two days after your treatment.  The most common ones are: -needing to pass urine often -pain when you pass urine -blood in urine -flu-like symptoms (tiredness, general aching and raised temperature)  Theses side effects should settle down within a day or two.  If they don't get better contact your doctor.  Drinking lots of fluids can help flush the drug out of your bladder and reduce some of these effects.  Taking Ibuprofen or Aleve is encouraged unless you have a condition that would make these medications unsafe to take (renal failure, diabetes, gerd)  Rare side effects can include a continuing high temperature (fever), pain in your joints and a cough.  If you have any of these symptoms, or if you feel generally unwell, contact your  doctor.  These symptoms could be a sign of a more serious infection (due to BCG) that needs to be treated immediately.  If this happens you'll be treated with the same drugs (antibiotics) that are used to treat TB.  Contraception Men should use a condom during sex for the first 48 hours after their treatment.  If you are a women who has had BCG treatment then your partner should use a condom.  Using a condom will protect your partner from any vaccine present in your semen or vaginal fluid.  We don't know how BCG may affect a developing fetus so it's not advisable to become pregnant or  father a child while having it.  It is important to use effective contraception during your treatment and for six weeks afterwards.  You can discuss this with your doctor or specialist nurse.         Bacillus Calmette-Guerin Live, BCG intravesical solution What is this medicine? BACILLUS CALMETTE-GUERIN LIVE, BCG (ba SIL us KAL met gay RAYN) is a bacteria solution. This medicine stimulates the immune system to ward off cancer cells. It is used to treat bladder cancer. This medicine may be used for other purposes; ask your health care provider or pharmacist if you have questions. COMMON BRAND NAME(S): Theracys, TICE BCG What should I tell my health care provider before I take this medicine? They need to know if you have any of these conditions:  aneurysm  blood in the urine  bladder biopsy within 2 weeks  fever or infection  immune system problems  leukemia  lymphoma  myasthenia gravis  need organ transplant  prosthetic device like arterial graft, artificial joint, prosthetic heart valve  recent or ongoing radiation therapy  tuberculosis  an unusual or allergic reaction to Bacillus Calmette-Guerin Live, BCG, latex, other medicines, foods, dyes, or preservatives  pregnant or trying to get pregnant  breast-feeding How should I use this medicine? This drug is given as a catheter infusion into the bladder. It is administered in a hospital or clinic by a specially trained health care provider. You will be given directions to follow before the treatment. Follow your health care provider's directions carefully. This medicine contains live bacteria. It is very important to follow these directions closely after treatment to prevent others from coming in contact with your urine. Your health care provider may give you additional directions to follow. Try to hold this medicine in your bladder for 1 to 2 hours. Follow these directions the first time you go to the bathroom and for 6  hours after the first void.  Wash your hands before using the restroom. After voiding, wash your hands and genital area.  Use a toilet and sit when going to the bathroom. This helps to prevent the urine from splashing. Do not use public toilets or void outside.  After each void, add 2 cups of undiluted bleach to the toilet. Close the lid. Wait 15 to 20 minutes and then flush the toilet.  After the first void, drink more fluids to help dilute your urine.  If you have urinary incontinence, wash the clothes you were wearing in the washer immediately. Do not wash other clothes at the same time.  If you are wearing an incontinence pad, pour bleach on the pad and allow it to soak into the pad before throwing it away. Put the pad in a plastic bag and put it in the trash. Talk to your pediatrician regarding the use of this medicine in children.   Special care may be needed. Overdosage: If you think you have taken too much of this medicine contact a poison control center or emergency room at once. NOTE: This medicine is only for you. Do not share this medicine with others. What if I miss a dose? It is important not to miss your dose. Call your doctor or health care professional if you are unable to keep an appointment. What may interact with this medicine?  antibiotics  medicines to suppress your immune system like chemotherapy agents or corticosteroids  medicine to treat tuberculosis This list may not describe all possible interactions. Give your health care provider a list of all the medicines, herbs, non-prescription drugs, or dietary supplements you use. Also tell them if you smoke, drink alcohol, or use illegal drugs. Some items may interact with your medicine. What should I watch for while using this medicine? Visit your health care provider for checks on your progress. This medicine may make you feel generally unwell. Contact your health care provider if your symptoms last more than 2 days or  if they get worse. Call your health care provider right away if you have a severe or unusual symptom. Infection can be spread to others through contact with this medicine. To prevent the spread of infection, follow your health care provider's directions carefully after treatment. Do not become pregnant while taking this medicine. There is a potential for serious side effects to an unborn child. Talk to your health care provider for more information. Do not breast-feed an infant while taking this medicine. If you have sex while on this medicine, use a condom. Ask your health care provider how long you should use a condom. What side effects may I notice from receiving this medicine? Side effects that you should report to your doctor or health care professional as soon as possible:  allergic reactions like skin rash, itching or hives, swelling of the face, lips, or tongue  signs of infection - fever or chills, cough, sore throat, pain or difficulty passing urine  signs of decreased red blood cells - unusually weak or tired, fainting spells, lightheadedness  blood in urine  breathing problems  cough  eye pain, redness  flu-like symptoms  joint pain  bladder-area pain for more than 2 days after treatment  trouble passing urine or change in the amount of urine  vomiting  yellowing of the eyes or skin Side effects that usually do not require medical attention (report to your doctor or health care professional if they continue or are bothersome):  bladder spasm  burning when passing urine within 2 days of treatment  feel need to pass urine often or wake up at night to pass urine  loss of appetite This list may not describe all possible side effects. Call your doctor for medical advice about side effects. You may report side effects to FDA at 1-800-FDA-1088. Where should I keep my medicine? This drug is given in a hospital or clinic and will not be stored at home. NOTE: This sheet  is a summary. It may not cover all possible information. If you have questions about this medicine, talk to your doctor, pharmacist, or health care provider.  2020 Elsevier/Gold Standard (2018-03-21 12:05:29)  

## 2019-01-02 NOTE — Progress Notes (Signed)
01/02/2019 11:14 AM   Sarah Rush 12-20-1960 PM:[id number]  Referring provider: No referring provider defined for this encounter.  Chief Complaint  Patient presents with  . Results    post op    HPI: 58 year old female with right lateral bladder wall tumor status post TURBT returns today to discuss findings.  Postoperatively, improving.  Irritative urinary symptoms.  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  Muscle is present not involved.    PMH: Past Medical History:  Diagnosis Date  . GERD (gastroesophageal reflux disease)   . Lower abdominal pain   . Rheumatoid arthritis ([place])   . Urinary tract infection symptoms     Surgical History: Past Surgical History:  Procedure Laterality Date  . CESAREAN SECTION    . CHOLECYSTECTOMY    . CYSTOSCOPY WITH BIOPSY N/A 12/22/2018   Procedure: CYSTOSCOPY WITH BIOPSY;  Surgeon: Hollice Espy, MD;  Location: [facility] ORS;  Service: Urology;  Laterality: N/A;  . TRANSURETHRAL RESECTION OF BLADDER TUMOR WITH MITOMYCIN-C N/A 12/22/2018   Procedure: TRANSURETHRAL RESECTION OF BLADDER TUMOR WITH Gemcitabine;  Surgeon: Hollice Espy, MD;  Location: [facility] ORS;  Service: Urology;  Laterality: N/A;    Home Medications:  Allergies as of 01/02/2019   No Known Allergies     Medication List       Accurate as of January 02, 2019 11:14 AM. If you have any questions, ask your nurse or doctor.        acetaminophen 325 MG tablet Commonly known as: TYLENOL Take 650 mg by mouth every 6 (six) hours as needed for moderate pain.   acidophilus Caps capsule Take 1 capsule by mouth daily.   docusate sodium 100 MG capsule Commonly known as: COLACE Take 1 capsule (100 mg total) by mouth 2 (two) times daily.   folic acid 1 MG tablet Commonly known as: FOLVITE Take 1 mg by mouth daily.   hydroxychloroquine 200 MG tablet Commonly known as: PLAQUENIL Take 200 mg by mouth 2 (two)  times daily.   methotrexate 2.5 MG tablet Commonly known as: RHEUMATREX Take 10 mg by mouth once a week.   omeprazole 20 MG capsule Commonly known as: PRILOSEC Take 20 mg by mouth daily.   oxybutynin 5 MG tablet Commonly known as: DITROPAN Take 1 tablet (5 mg total) by mouth every 8 (eight) hours as needed for bladder spasms.       Allergies: No Known Allergies  Family History: No family history on file.  Social History:  reports that she quit smoking about 5 years ago. She has never used smokeless tobacco. She reports previous alcohol use. She reports that she does not use drugs.  ROS: UROLOGY Frequent Urination?: No Hard to postpone urination?: No Burning/pain with urination?: Yes Get up at night to urinate?: Yes Leakage of urine?: No Urine stream starts and stops?: No Trouble starting stream?: No Do you have to strain to urinate?: No Blood in urine?: Yes Urinary tract infection?: No Sexually transmitted disease?: No Injury to kidneys or bladder?: No Painful intercourse?: No Weak stream?: No Currently pregnant?: No Vaginal bleeding?: No Last menstrual period?: n  Gastrointestinal Nausea?: No Vomiting?: No Indigestion/heartburn?: No Diarrhea?: No Constipation?: No  Constitutional Fever: No Night sweats?: No Weight loss?: No Fatigue?: No  Skin Skin rash/lesions?: No Itching?: No  Eyes Blurred vision?: No Double vision?: No  Ears/Nose/Throat Sore throat?: No Sinus problems?: No  Hematologic/Lymphatic Swollen glands?: No Easy bruising?: No  Cardiovascular Leg swelling?: No Chest  pain?: No  Respiratory Cough?: No Shortness of breath?: No  Endocrine Excessive thirst?: No  Musculoskeletal Back pain?: No Joint pain?: No  Neurological Headaches?: No Dizziness?: No  Psychologic Depression?: No Anxiety?: No  Physical Exam: BP (!) 153/76   Pulse 91   Ht 5\' 4"  (1.626 m)   Wt 155 lb (70.3 kg)   BMI 26.61 kg/m   Constitutional:   Alert and oriented, No acute distress. HEENT: [place] AT, moist mucus membranes.  Trachea midline, no masses. Cardiovascular: No clubbing, cyanosis, or edema. Respiratory: Normal respiratory effort, no increased work of breathing.  Skin: No rashes, bruises or suspicious lesions. Neurologic: Grossly intact, no focal deficits, moving all 4 extremities. Psychiatric: Normal mood and affect.  Laboratory Data: Lab Results  Component Value Date   WBC 6.9 05/13/2014   HGB 15.8 05/13/2014   HCT 46.3 05/13/2014   MCV 88 05/13/2014   PLT 207 05/13/2014    Lab Results  Component Value Date   CREATININE 0.90 12/04/2018    Assessment & Plan:    1. 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  Risk and benefits discussed.  She is agreeable this plan.  Literature was provided.   Hollice Espy, MD  [facility] [address] [place], [place] [address] [phone]

## 2019-01-05 ENCOUNTER — Telehealth: Payer: Self-pay | Admitting: Urology

## 2019-01-05 NOTE — Telephone Encounter (Signed)
Please let this patient know that I was able to get in touch with her rheumatologist, Dr. Meda Coffee.  She would like her to go ahead and hold her methotrexate and Plaquenil starting now appear to take several weeks for him to get out of her system.    Starting BCG in early January makes sense for this timeline.  She will treat any flares as needed if she does have issues being off of these medications.  Hollice Espy, MD

## 2019-01-05 NOTE — Telephone Encounter (Signed)
Patient informed-verbalized understanding will stop her plaquenil and methotrexate.

## 2019-01-06 ENCOUNTER — Ambulatory Visit: Payer: Commercial Managed Care - PPO | Admitting: Urology

## 2019-02-03 ENCOUNTER — Ambulatory Visit (INDEPENDENT_AMBULATORY_CARE_PROVIDER_SITE_OTHER): Payer: Commercial Managed Care - PPO | Admitting: Physician Assistant

## 2019-02-03 ENCOUNTER — Encounter: Payer: Self-pay | Admitting: Physician Assistant

## 2019-02-03 ENCOUNTER — Other Ambulatory Visit: Payer: Self-pay

## 2019-02-03 VITALS — BP 138/76 | HR 96 | Ht 63.0 in | Wt 155.0 lb

## 2019-02-03 DIAGNOSIS — D494 Neoplasm of unspecified behavior of bladder: Secondary | ICD-10-CM

## 2019-02-03 LAB — URINALYSIS, COMPLETE
Bilirubin, UA: NEGATIVE
Glucose, UA: NEGATIVE
Ketones, UA: NEGATIVE
Nitrite, UA: NEGATIVE
Protein,UA: NEGATIVE
Specific Gravity, UA: >1.03 — ABNORMAL HIGH (ref 1.005–1.030)
Urobilinogen, Ur: 0.2 mg/dL (ref 0.2–1.0)
pH, UA: 5.5 (ref 5.0–7.5)

## 2019-02-03 LAB — MICROSCOPIC EXAMINATION

## 2019-02-03 MED ORDER — BCG LIVE 50 MG IS SUSR
3.2400 mL | Freq: Once | INTRAVESICAL | Status: AC
  Administered 2019-02-03: 81 mg via INTRAVESICAL

## 2019-02-03 NOTE — Patient Instructions (Signed)

## 2019-02-03 NOTE — Progress Notes (Signed)
BCG Bladder Instillation  BCG # 1 of 6  Due to Bladder Cancer patient is present today for a BCG treatment. Patient was cleaned and prepped in a sterile fashion with betadine.  A 14FR catheter was inserted, urine return was noted 62ml, urine was yellow in color.  58ml of reconstituted BCG was instilled into the bladder. The catheter was then removed. Patient tolerated well, no complications were noted  Performed by: Debroah Loop, PA-C and Gaspar Cola, CMA  Follow up/ Additional notes: Kept patient in clinic for 30 minutes to evaluate for hypersensitivity reaction. None noted. Written and verbal instructions for post-instillation aftercare provided. Return in 1 week for BCG #2 of 6.

## 2019-02-10 ENCOUNTER — Ambulatory Visit: Payer: Commercial Managed Care - PPO | Admitting: Physician Assistant

## 2019-02-11 ENCOUNTER — Ambulatory Visit (INDEPENDENT_AMBULATORY_CARE_PROVIDER_SITE_OTHER): Payer: Commercial Managed Care - PPO | Admitting: Urology

## 2019-02-11 ENCOUNTER — Encounter: Payer: Self-pay | Admitting: Urology

## 2019-02-11 ENCOUNTER — Other Ambulatory Visit: Payer: Self-pay

## 2019-02-11 DIAGNOSIS — D494 Neoplasm of unspecified behavior of bladder: Secondary | ICD-10-CM | POA: Diagnosis not present

## 2019-02-11 MED ORDER — BCG LIVE 50 MG IS SUSR
3.2400 mL | Freq: Once | INTRAVESICAL | Status: AC
  Administered 2019-02-11: 81 mg via INTRAVESICAL

## 2019-02-11 NOTE — Progress Notes (Signed)
BCG Bladder Instillation  BCG # 1 of 6  Due to Bladder Cancer patient is present today for a BCG treatment. Patient was cleaned and prepped in a sterile fashion with betadine and lidocaine 2% jelly was instilled into the urethra.  A 14 FR catheter was inserted, urine return was noted 30 ml, urine was yellow in color.  3ml of reconstituted BCG was instilled into the bladder. The catheter was then removed. Patient tolerated well, no complications were noted  Performed by: Zara Council, PA-C and Elberta Leatherwood, CMA  Follow up/ Additional notes: RTC in one week for # 3 of 6

## 2019-02-12 LAB — URINALYSIS, COMPLETE
Bilirubin, UA: NEGATIVE
Glucose, UA: NEGATIVE
Nitrite, UA: NEGATIVE
Protein,UA: NEGATIVE
RBC, UA: NEGATIVE
Specific Gravity, UA: >1.03 — ABNORMAL HIGH (ref 1.005–1.030)
Urobilinogen, Ur: 0.2 mg/dL (ref 0.2–1.0)
pH, UA: 5.5 (ref 5.0–7.5)

## 2019-02-12 LAB — MICROSCOPIC EXAMINATION: RBC, Urine: NONE SEEN /HPF (ref 0–2)

## 2019-02-17 ENCOUNTER — Ambulatory Visit: Payer: Commercial Managed Care - PPO | Admitting: Physician Assistant

## 2019-02-17 ENCOUNTER — Other Ambulatory Visit: Payer: Self-pay

## 2019-02-17 ENCOUNTER — Encounter: Payer: Self-pay | Admitting: Physician Assistant

## 2019-02-17 DIAGNOSIS — D494 Neoplasm of unspecified behavior of bladder: Secondary | ICD-10-CM

## 2019-02-17 LAB — URINALYSIS, COMPLETE
Bilirubin, UA: NEGATIVE
Glucose, UA: NEGATIVE
Ketones, UA: NEGATIVE
Leukocytes,UA: NEGATIVE
Nitrite, UA: NEGATIVE
Protein,UA: NEGATIVE
RBC, UA: NEGATIVE
Specific Gravity, UA: >1.03 — ABNORMAL HIGH (ref 1.005–1.030)
Urobilinogen, Ur: 0.2 mg/dL (ref 0.2–1.0)
pH, UA: 5.5 (ref 5.0–7.5)

## 2019-02-17 LAB — MICROSCOPIC EXAMINATION
Bacteria, UA: NONE SEEN
RBC, Urine: NONE SEEN /hpf (ref 0–2)

## 2019-02-17 MED ORDER — BCG LIVE 50 MG IS SUSR
3.2400 mL | Freq: Once | INTRAVESICAL | Status: AC
  Administered 2019-02-17: 81 mg via INTRAVESICAL

## 2019-02-17 NOTE — Progress Notes (Signed)
BCG Bladder Instillation  BCG # 3 of 6  Due to Bladder Cancer patient is present today for a BCG treatment. Patient was cleaned and prepped in a sterile fashion with betadine. A 14FR catheter was inserted, urine return was noted 23ml, urine was yellow in color.  69ml of reconstituted BCG was instilled into the bladder. The catheter was then removed. Patient tolerated well, no complications were noted  Performed by: Debroah Loop, PA-C and Gaspar Cola, CMA  Follow up/ Additional notes: 1 week for BCG #4 of 6

## 2019-02-17 NOTE — Patient Instructions (Signed)

## 2019-02-24 ENCOUNTER — Encounter: Payer: Self-pay | Admitting: Physician Assistant

## 2019-02-24 ENCOUNTER — Other Ambulatory Visit: Payer: Self-pay

## 2019-02-24 ENCOUNTER — Ambulatory Visit: Payer: Commercial Managed Care - PPO | Admitting: Physician Assistant

## 2019-02-24 VITALS — BP 148/86 | Ht 65.0 in | Wt 155.0 lb

## 2019-02-24 DIAGNOSIS — N3001 Acute cystitis with hematuria: Secondary | ICD-10-CM

## 2019-02-24 DIAGNOSIS — D494 Neoplasm of unspecified behavior of bladder: Secondary | ICD-10-CM

## 2019-02-24 MED ORDER — BCG LIVE 50 MG IS SUSR
3.2400 mL | Freq: Once | INTRAVESICAL | Status: AC
  Administered 2019-02-24: 81 mg via INTRAVESICAL

## 2019-02-24 MED ORDER — SULFAMETHOXAZOLE-TRIMETHOPRIM 800-160 MG PO TABS: 1.0000 | ORAL_TABLET | Freq: Two times a day (BID) | ORAL | 0 refills | Status: AC

## 2019-02-24 NOTE — Progress Notes (Addendum)
Patient presented to clinic today for planned instillation of BCG #4 of 6.  Unfortunately, UA is grossly infected today.  She denies irritative voiding symptoms including dysuria, urgency, and frequency.  We will treat with empiric Bactrim twice daily x5 days and send for culture.  I counseled her that I would contact her if her urine culture indicated a necessary change in therapy.  She expressed understanding.  Microscopic hematuria noted today, uncertain if this is secondary to bladder cancer, BCG treatments, or acute cystitis.  She is due for repeat UA next week at her next scheduled BCG treatment and we can reevaluate this at this time.  Debroah Loop, PA-C  02/24/19 3:21 PM

## 2019-02-25 LAB — URINALYSIS, COMPLETE
Bilirubin, UA: NEGATIVE
Glucose, UA: NEGATIVE
Ketones, UA: NEGATIVE
Nitrite, UA: POSITIVE — AB
Specific Gravity, UA: >1.03 — ABNORMAL HIGH (ref 1.005–1.030)
Urobilinogen, Ur: 0.2 mg/dL (ref 0.2–1.0)
pH, UA: 5 (ref 5.0–7.5)

## 2019-02-25 LAB — MICROSCOPIC EXAMINATION: WBC, UA: >30 /hpf — AB (ref 0–5)

## 2019-02-26 LAB — CULTURE, URINE COMPREHENSIVE

## 2019-03-03 ENCOUNTER — Other Ambulatory Visit: Payer: Self-pay

## 2019-03-03 ENCOUNTER — Ambulatory Visit: Payer: Commercial Managed Care - PPO | Admitting: Physician Assistant

## 2019-03-03 DIAGNOSIS — D494 Neoplasm of unspecified behavior of bladder: Secondary | ICD-10-CM | POA: Diagnosis not present

## 2019-03-03 MED ORDER — BCG LIVE 50 MG IS SUSR
3.2400 mL | Freq: Once | INTRAVESICAL | Status: AC
  Administered 2019-03-03: 81 mg via INTRAVESICAL

## 2019-03-03 NOTE — Progress Notes (Signed)
BCG Bladder Instillation  BCG # 4 of 6  Due to Bladder Cancer patient is present today for a BCG treatment. Patient was cleaned and prepped in a sterile fashion with betadine. A 14FR catheter was inserted, urine return was noted 1ml, urine was yellow in color.  64ml of reconstituted BCG was instilled into the bladder. The catheter was then removed. Patient tolerated well, no complications were noted  Performed by: Debroah Loop, PA-C and Gaspar Cola, CMA  Follow up/ Additional notes: 1 week for BCG #5 of 6

## 2019-03-05 LAB — MICROSCOPIC EXAMINATION
Bacteria, UA: NONE SEEN
RBC, Urine: NONE SEEN /hpf (ref 0–2)

## 2019-03-05 LAB — URINALYSIS, COMPLETE
Bilirubin, UA: NEGATIVE
Glucose, UA: NEGATIVE
Ketones, UA: NEGATIVE
Leukocytes,UA: NEGATIVE
Nitrite, UA: NEGATIVE
Protein,UA: NEGATIVE
RBC, UA: NEGATIVE
Specific Gravity, UA: >1.03 — ABNORMAL HIGH (ref 1.005–1.030)
Urobilinogen, Ur: 0.2 mg/dL (ref 0.2–1.0)
pH, UA: 5 (ref 5.0–7.5)

## 2019-03-10 ENCOUNTER — Ambulatory Visit: Payer: Commercial Managed Care - PPO | Admitting: Physician Assistant

## 2019-03-10 ENCOUNTER — Other Ambulatory Visit: Payer: Self-pay

## 2019-03-10 ENCOUNTER — Encounter: Payer: Self-pay | Admitting: Physician Assistant

## 2019-03-10 VITALS — BP 130/74 | HR 78 | Ht 68.0 in | Wt 155.0 lb

## 2019-03-10 DIAGNOSIS — D494 Neoplasm of unspecified behavior of bladder: Secondary | ICD-10-CM | POA: Diagnosis not present

## 2019-03-10 LAB — URINALYSIS, COMPLETE
Bilirubin, UA: NEGATIVE
Glucose, UA: NEGATIVE
Leukocytes,UA: NEGATIVE
Nitrite, UA: NEGATIVE
Protein,UA: NEGATIVE
RBC, UA: NEGATIVE
Specific Gravity, UA: >1.03 — ABNORMAL HIGH (ref 1.005–1.030)
Urobilinogen, Ur: 0.2 mg/dL (ref 0.2–1.0)
pH, UA: 5 (ref 5.0–7.5)

## 2019-03-10 LAB — MICROSCOPIC EXAMINATION

## 2019-03-10 MED ORDER — BCG LIVE 50 MG IS SUSR
3.2400 mL | Freq: Once | INTRAVESICAL | Status: AC
  Administered 2019-03-10: 81 mg via INTRAVESICAL

## 2019-03-10 NOTE — Progress Notes (Signed)
BCG Bladder Instillation  BCG # 5 of 6  Due to Bladder Cancer patient is present today for a BCG treatment. Patient was cleaned and prepped in a sterile fashion with betadine. A 14FR catheter was inserted, urine return was noted 109ml, urine was yellow in color.  30ml of reconstituted BCG was instilled into the bladder. The catheter was then removed. Patient tolerated well, no complications were noted  Performed by: Debroah Loop, PA-C and Gaspar Cola, CMA  Follow up/ Additional notes: 1 week for BCG #5 of 6

## 2019-03-17 ENCOUNTER — Other Ambulatory Visit: Payer: Self-pay

## 2019-03-17 ENCOUNTER — Ambulatory Visit (INDEPENDENT_AMBULATORY_CARE_PROVIDER_SITE_OTHER): Payer: Commercial Managed Care - PPO | Admitting: Physician Assistant

## 2019-03-17 DIAGNOSIS — D494 Neoplasm of unspecified behavior of bladder: Secondary | ICD-10-CM | POA: Diagnosis not present

## 2019-03-17 MED ORDER — BCG LIVE 50 MG IS SUSR
3.2400 mL | Freq: Once | INTRAVESICAL | Status: AC
  Administered 2019-03-17: 81 mg via INTRAVESICAL

## 2019-03-17 NOTE — Patient Instructions (Signed)

## 2019-03-17 NOTE — Progress Notes (Signed)
BCG Bladder Instillation  BCG # 6 of 6  Due to Bladder Cancer patient is present today for a BCG treatment. Patient was cleaned and prepped in a sterile fashion with betadine. A 14FR catheter was inserted, urine return was noted 4ml, urine was yellow in color.  72ml of reconstituted BCG was instilled into the bladder. The catheter was then removed. Patient tolerated well, no complications were noted  Performed by: Fonnie Jarvis, CMA and Debroah Loop, PA-C   Follow up/ Additional notes: 3 months for cysto with Dr. Erlene Quan.

## 2019-03-18 LAB — URINALYSIS, COMPLETE
Bilirubin, UA: NEGATIVE
Glucose, UA: NEGATIVE
Nitrite, UA: NEGATIVE
Protein,UA: NEGATIVE
RBC, UA: NEGATIVE
Specific Gravity, UA: >1.03 — ABNORMAL HIGH (ref 1.005–1.030)
Urobilinogen, Ur: 0.2 mg/dL (ref 0.2–1.0)
pH, UA: 5 (ref 5.0–7.5)

## 2019-03-18 LAB — MICROSCOPIC EXAMINATION: RBC, Urine: NONE SEEN /hpf (ref 0–2)

## 2019-06-15 NOTE — Progress Notes (Signed)
06/16/19   CC:  Chief Complaint  Patient presents with  . Cysto    HPI: Sarah Rush is a 59 y.o. F w/ hx of bladder cancer s/p TURBT w/ gemitabine on 12/22/18 presents today for cysto s/p BCG.   BCG completed on 03/17/19. Tolerated well.   Her last upper tract imaging was in 11/2018.   Today's Vitals   06/16/19 1352  BP: 138/83  Pulse: 91   There is no height or weight on file to calculate BMI. NED. A&Ox3.   No respiratory distress   Abd soft, NT, ND Normal external genitalia with patent urethral meatus  Cystoscopy Procedure Note  Patient identification was confirmed, informed consent was obtained, and patient was prepped using Betadine solution.  Lidocaine jelly was administered per urethral meatus.    Procedure: - Flexible cystoscope introduced, without any difficulty.   - Thorough search of the bladder revealed:    normal urethral meatus    normal urothelium    no stones    no ulcers     5 tumors < 1 cm each: 2 at dome and 3 at anterior bladder neck (appreciated on retroflexion)    no urethral polyps    no trabeculation  - Ureteral orifices were normal in position and appearance.  Post-Procedure: - Patient tolerated the procedure well  Laboratory data:  SURGICAL PATHOLOGY  CASE: [id number]  PATIENT: Sarah Rush  Surgical Pathology Report      Specimen Submitted:  A. Erythema, posterior wall  B. Deep base  C. Bladder tumor   Clinical History: Bladder tumor     DIAGNOSIS:  A. URINARY BLADDER, POSTERIOR WALL ERYTHEMA; TRANSURETHRAL BIOPSY:  - UROTHELIAL CARCINOMA IN SITU, AND BACKGROUND FOLLICULAR CYSTITIS.  - NO EVIDENCE OF INVASIVE CARCINOMA   B. URINARY BLADDER, DEEP BASE; TRANSURETHRAL BIOPSY:  - CAUTERIZED FRAGMENT OF UROTHELIAL TISSUE, SUSPICIOUS FOR HIGH GRADE  UROTHELIAL CARCINOMA.  - MUSCULARIS PROPRIA PRESENT AND UNINVOLVED.   C. 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  - MUSCULARIS PROPRIA PRESENT AND UNINVOLVED.   Comment:  Definitive lamina propria invasion is not identified. However, it should  be noted that evaluation is limited by cautery artifact, and focal  invasion cannot be entirely excluded.   The findings were communicated to Dr. Erlene Quan at 12:05 pm on 12/24/2018  via [facility] secure chat.   GROSS DESCRIPTION:  A. Labeled: Posterior wall erythema  Received: Formalin  Tissue fragment(s): 2  Size: 0.3 cm (each)  Description: Received are irregular fragments of tan soft tissue.  Filtered into a mesh bag.  Entirely submitted in cassette 1.   B. Labeled: Deep base  Received: Formalin  Tissue fragment(s): 3  Size: 0.3-0.4 cm  Description: Received are irregular fragments of tan soft tissue.  Filtered into a mesh bag.  Entirely submitted in cassette 1.   C. Labeled: Bladder tumor  Received: Formalin  Tissue fragment(s): Multiple  Size: Aggregate, 14.5 x 2.0 x 0.5 cm  Description: Received are irregular fragments of tan-pink soft tissue  admixed with very minimal, hemorrhagic material. Filtered into mesh  bags.  Entirely submitted in cassettes 1-7.     Final Diagnosis performed by Allena Napoleon, MD.  Electronically signed  12/24/2018 12:17:25PM  The electronic signature indicates that the named Attending Pathologist  has evaluated the specimen  Technical component performed at [place], [address], [place],  [place] [address] Lab: [phone] Dir: Rush Farmer, MD, MMM  Professional component performed at [facility], [facility]  [facility], [address], [place], [place] [address] Lab: [phone]  Dir: Dellia Nims. Reuel Derby, MD   Assessment/ Plan:  1. Hx of bladder cancer  S/p TURBT w/ gemitabine on 12/22/18 BCG completed on 03/17/19.  5 tumors noted on cysto Discussed TURBT, B RTG with intravesical chemo w/ risk and benefits  Will need repeat BCG x 6 given failure Pt understood and will proceed  w/ plan  Pre-op urine/culture sent    I, Lucas Mallow, am acting as a scribe for Dr. Hollice Espy,  I have reviewed the above documentation for accuracy and completeness, and I agree with the above.   Hollice Espy, MD  c

## 2019-06-15 NOTE — H&P (View-Only) (Signed)
06/16/19   CC:  Chief Complaint  Patient presents with  . Cysto    HPI: Sarah Rush is a 59 y.o. F w/ hx of bladder cancer s/p TURBT w/ gemitabine on 12/22/18 presents today for cysto s/p BCG.   BCG completed on 03/17/19. Tolerated well.   Her last upper tract imaging was in 11/2018.   Today's Vitals   06/16/19 1352  BP: 138/83  Pulse: 91   There is no height or weight on file to calculate BMI. NED. A&Ox3.   No respiratory distress   Abd soft, NT, ND Normal external genitalia with patent urethral meatus  Cystoscopy Procedure Note  Patient identification was confirmed, informed consent was obtained, and patient was prepped using Betadine solution.  Lidocaine jelly was administered per urethral meatus.    Procedure: - Flexible cystoscope introduced, without any difficulty.   - Thorough search of the bladder revealed:    normal urethral meatus    normal urothelium    no stones    no ulcers     5 tumors < 1 cm each: 2 at dome and 3 at anterior bladder neck (appreciated on retroflexion)    no urethral polyps    no trabeculation  - Ureteral orifices were normal in position and appearance.  Post-Procedure: - Patient tolerated the procedure well  Laboratory data:  SURGICAL PATHOLOGY  CASE: [id number]  PATIENT: Sarah Rush  Surgical Pathology Report      Specimen Submitted:  A. Erythema, posterior wall  B. Deep base  C. Bladder tumor   Clinical History: Bladder tumor     DIAGNOSIS:  A. URINARY BLADDER, POSTERIOR WALL ERYTHEMA; TRANSURETHRAL BIOPSY:  - UROTHELIAL CARCINOMA IN SITU, AND BACKGROUND FOLLICULAR CYSTITIS.  - NO EVIDENCE OF INVASIVE CARCINOMA   B. URINARY BLADDER, DEEP BASE; TRANSURETHRAL BIOPSY:  - CAUTERIZED FRAGMENT OF UROTHELIAL TISSUE, SUSPICIOUS FOR HIGH GRADE  UROTHELIAL CARCINOMA.  - MUSCULARIS PROPRIA PRESENT AND UNINVOLVED.   C. 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  - MUSCULARIS PROPRIA PRESENT AND UNINVOLVED.   Comment:  Definitive lamina propria invasion is not identified. However, it should  be noted that evaluation is limited by cautery artifact, and focal  invasion cannot be entirely excluded.   The findings were communicated to Dr. Erlene Quan at 12:05 pm on 12/24/2018  via [facility] secure chat.   GROSS DESCRIPTION:  A. Labeled: Posterior wall erythema  Received: Formalin  Tissue fragment(s): 2  Size: 0.3 cm (each)  Description: Received are irregular fragments of tan soft tissue.  Filtered into a mesh bag.  Entirely submitted in cassette 1.   B. Labeled: Deep base  Received: Formalin  Tissue fragment(s): 3  Size: 0.3-0.4 cm  Description: Received are irregular fragments of tan soft tissue.  Filtered into a mesh bag.  Entirely submitted in cassette 1.   C. Labeled: Bladder tumor  Received: Formalin  Tissue fragment(s): Multiple  Size: Aggregate, 14.5 x 2.0 x 0.5 cm  Description: Received are irregular fragments of tan-pink soft tissue  admixed with very minimal, hemorrhagic material. Filtered into mesh  bags.  Entirely submitted in cassettes 1-7.     Final Diagnosis performed by Allena Napoleon, MD.  Electronically signed  12/24/2018 12:17:25PM  The electronic signature indicates that the named Attending Pathologist  has evaluated the specimen  Technical component performed at [place], [address] [place],  [place] [address] Lab: [phone] Dir: Rush Farmer, MD, MMM  Professional component performed at [facility], [facility]  [facility], [place], [place], [place] [address] Lab: [phone]  Dir: Dellia Nims. Reuel Derby, MD   Assessment/ Plan:  1. Hx of bladder cancer  S/p TURBT w/ gemitabine on 12/22/18 BCG completed on 03/17/19.  5 tumors noted on cysto Discussed TURBT, B RTG with intravesical chemo w/ risk and benefits  Will need repeat BCG x 6 given failure Pt understood and will proceed  w/ plan  Pre-op urine/culture sent    I, Lucas Mallow, am acting as a scribe for Dr. Hollice Espy,  I have reviewed the above documentation for accuracy and completeness, and I agree with the above.   Hollice Espy, MD  c

## 2019-06-16 ENCOUNTER — Encounter: Payer: Self-pay | Admitting: Urology

## 2019-06-16 ENCOUNTER — Other Ambulatory Visit: Payer: Self-pay

## 2019-06-16 ENCOUNTER — Ambulatory Visit: Payer: Commercial Managed Care - PPO | Admitting: Urology

## 2019-06-16 VITALS — BP 138/83 | HR 91

## 2019-06-16 DIAGNOSIS — D494 Neoplasm of unspecified behavior of bladder: Secondary | ICD-10-CM | POA: Diagnosis not present

## 2019-06-17 ENCOUNTER — Telehealth: Payer: Self-pay

## 2019-06-17 ENCOUNTER — Other Ambulatory Visit: Payer: Self-pay | Admitting: Radiology

## 2019-06-17 DIAGNOSIS — D494 Neoplasm of unspecified behavior of bladder: Secondary | ICD-10-CM

## 2019-06-17 LAB — URINALYSIS, COMPLETE
Bilirubin, UA: NEGATIVE
Glucose, UA: NEGATIVE
Ketones, UA: NEGATIVE
Nitrite, UA: NEGATIVE
Specific Gravity, UA: >1.03 — ABNORMAL HIGH (ref 1.005–1.030)
Urobilinogen, Ur: 0.2 mg/dL (ref 0.2–1.0)
pH, UA: 6.5 (ref 5.0–7.5)

## 2019-06-17 LAB — MICROSCOPIC EXAMINATION

## 2019-06-17 MED ORDER — GEMCITABINE CHEMO FOR BLADDER INSTILLATION 2000 MG: 2000.0000 mg | Freq: Once | INTRAVENOUS | Status: DC

## 2019-06-17 NOTE — Telephone Encounter (Signed)
-----   Message from [place], RN sent at 06/17/2019 12:12 PM EDT ----- Regarding: BCG This patient is scheduled for TURBT on 6/3 and Dr Erlene Quan wants her to get BCG x 6 beginning 6 weeks post op. Can you schedule that for her? Thanks!

## 2019-06-19 LAB — CULTURE, URINE COMPREHENSIVE

## 2019-06-22 ENCOUNTER — Other Ambulatory Visit: Payer: Commercial Managed Care - PPO

## 2019-06-22 ENCOUNTER — Other Ambulatory Visit: Payer: Self-pay | Admitting: Family Medicine

## 2019-06-22 ENCOUNTER — Telehealth: Payer: Self-pay | Admitting: Family Medicine

## 2019-06-22 ENCOUNTER — Other Ambulatory Visit: Payer: Self-pay

## 2019-06-22 DIAGNOSIS — D494 Neoplasm of unspecified behavior of bladder: Secondary | ICD-10-CM

## 2019-06-22 LAB — URINALYSIS, COMPLETE
Bilirubin, UA: NEGATIVE
Glucose, UA: NEGATIVE
Ketones, UA: NEGATIVE
Nitrite, UA: NEGATIVE
RBC, UA: NEGATIVE
Specific Gravity, UA: >1.03 — ABNORMAL HIGH (ref 1.005–1.030)
Urobilinogen, Ur: 0.2 mg/dL (ref 0.2–1.0)
pH, UA: 5.5 (ref 5.0–7.5)

## 2019-06-22 LAB — MICROSCOPIC EXAMINATION: RBC, Urine: NONE SEEN /hpf (ref 0–2)

## 2019-06-22 NOTE — Telephone Encounter (Signed)
Patient called stating she is having right sided flank pain. She just had a negative UCX on 06/16/19. She is scheduled for surgery on 07/02/19 and wants to be sure she does not have and infection since she now has symptoms. A lab appointment has been scheduled.

## 2019-06-25 ENCOUNTER — Other Ambulatory Visit: Payer: Self-pay

## 2019-06-25 ENCOUNTER — Encounter
Admission: RE | Admit: 2019-06-25 | Discharge: 2019-06-25 | Disposition: A | Discharge status: Home / Self Care | Payer: Commercial Managed Care - PPO | Source: Ambulatory Visit | Attending: Urology | Admitting: Urology

## 2019-06-25 LAB — CULTURE, URINE COMPREHENSIVE

## 2019-06-25 NOTE — Patient Instructions (Addendum)
COVID TESTING Date: Tuesday, June 1 Testing site:  [facility] [address] Thru Hours:  [id number] am - 1:00 pm Once you are tested, you are asked to stay quarantined (avoiding public places) until after your surgery.  Your procedure is scheduled on: Thursday, June 3 Report to Day Surgery on the 2nd floor of the [facility]. To find out your arrival time, please call [phone] between 1PM - 3PM on: Wednesday, June 2  REMEMBER: Instructions that are not followed completely may result in serious medical risk, up to and including death; or upon the discretion of your surgeon and anesthesiologist your surgery may need to be rescheduled.  Do not eat food after midnight the night before surgery.  No gum chewing, lozengers or hard candies.  You may however, drink CLEAR liquids up to 2 hours before you are scheduled to arrive for your surgery. Do not drink anything within 2 hours of your scheduled arrival time.  Clear liquids include: - water  - apple juice without pulp - gatorade (not RED) - black coffee or tea (Do NOT add milk or creamers to the coffee or tea) Do NOT drink anything that is not on this list.  TAKE THESE MEDICATIONS THE MORNING OF SURGERY WITH A SIP OF WATER:  1. Omeprazole - (take one the night before and one on the morning of surgery - helps to prevent nausea after surgery.)  1 week prior to surgery: Stop Anti-inflammatories (NSAIDS) such as Advil, Aleve, Ibuprofen, Motrin, Naproxen, Naprosyn and Aspirin based products such as Excedrin, Goodys Powder, BC Powder. (May take Tylenol or Acetaminophen if needed.)  Stop ANY OVER THE COUNTER supplements until after surgery.  No Alcohol for 24 hours before or after surgery.  On the morning of surgery brush your teeth with toothpaste and water, you may rinse your mouth with mouthwash if you wish. Do not swallow any toothpaste or mouthwash.  Do not wear jewelry, make-up, hairpins, clips or nail  polish.  Do not wear lotions, powders, or perfumes.   Do not shave 48 hours prior to surgery.   Contact lenses, hearing aids and dentures may not be worn into surgery.  Do not bring valuables to the hospital. [facility] is not responsible for any missing/lost belongings or valuables.   Notify your doctor if there is any change in your medical condition (cold, fever, infection).  Wear comfortable clothing (specific to your surgery type) to the hospital.  Plan for stool softeners for home use; pain medications have a tendency to cause constipation. You can also help prevent constipation by eating foods high in fiber such as fruits and vegetables and drinking plenty of fluids as your diet allows.  After surgery, you can help prevent lung complications by doing breathing exercises.  Take deep breaths and cough every 1-2 hours. Your doctor may order a device called an Incentive Spirometer to help you take deep breaths.  If you are being discharged the day of surgery, you will not be allowed to drive home. You will need a responsible adult (18 years or older) to drive you home and stay with you that night.   If you are taking public transportation, you will need to have a responsible adult (18 years or older) with you. Please confirm with your physician that it is acceptable to use public transportation.   Please call the [place] [facility]. at [phone] if you have any questions about these instructions.  Visitation Policy:  Patients undergoing  a surgery or procedure may have one family member or support person with them as long as that person is not COVID-19 positive or experiencing its symptoms.  That person may remain in the waiting area during the procedure.

## 2019-06-30 ENCOUNTER — Other Ambulatory Visit
Admission: RE | Admit: 2019-06-30 | Discharge: 2019-06-30 | Disposition: A | Discharge status: Home / Self Care | Payer: Commercial Managed Care - PPO | Source: Ambulatory Visit | Attending: Urology | Admitting: Urology

## 2019-06-30 ENCOUNTER — Other Ambulatory Visit: Payer: Self-pay

## 2019-06-30 DIAGNOSIS — Z20822 Contact with and (suspected) exposure to covid-19: Secondary | ICD-10-CM | POA: Insufficient documentation

## 2019-06-30 DIAGNOSIS — Z01812 Encounter for preprocedural laboratory examination: Secondary | ICD-10-CM | POA: Insufficient documentation

## 2019-06-30 LAB — SARS CORONAVIRUS 2 (TAT 6-24 HRS): SARS Coronavirus 2: NEGATIVE

## 2019-07-02 ENCOUNTER — Ambulatory Visit
Admission: RE | Admit: 2019-07-02 | Discharge: 2019-07-02 | Disposition: A | Discharge status: Home / Self Care | Payer: Commercial Managed Care - PPO | Attending: Urology | Admitting: Urology

## 2019-07-02 ENCOUNTER — Other Ambulatory Visit: Payer: Self-pay

## 2019-07-02 ENCOUNTER — Encounter
Admission: RE | Disposition: A | Discharge status: Home / Self Care | Payer: Self-pay | Source: Home / Self Care | Attending: Urology

## 2019-07-02 ENCOUNTER — Ambulatory Visit: Payer: Commercial Managed Care - PPO | Admitting: Anesthesiology

## 2019-07-02 ENCOUNTER — Ambulatory Visit: Payer: Commercial Managed Care - PPO

## 2019-07-02 ENCOUNTER — Encounter: Payer: Self-pay | Admitting: Urology

## 2019-07-02 DIAGNOSIS — C673 Malignant neoplasm of anterior wall of bladder: Secondary | ICD-10-CM | POA: Insufficient documentation

## 2019-07-02 DIAGNOSIS — K219 Gastro-esophageal reflux disease without esophagitis: Secondary | ICD-10-CM | POA: Insufficient documentation

## 2019-07-02 DIAGNOSIS — D494 Neoplasm of unspecified behavior of bladder: Secondary | ICD-10-CM

## 2019-07-02 DIAGNOSIS — M069 Rheumatoid arthritis, unspecified: Secondary | ICD-10-CM | POA: Diagnosis not present

## 2019-07-02 DIAGNOSIS — Z8551 Personal history of malignant neoplasm of bladder: Secondary | ICD-10-CM

## 2019-07-02 DIAGNOSIS — C675 Malignant neoplasm of bladder neck: Secondary | ICD-10-CM | POA: Diagnosis not present

## 2019-07-02 DIAGNOSIS — Z87891 Personal history of nicotine dependence: Secondary | ICD-10-CM | POA: Diagnosis not present

## 2019-07-02 HISTORY — PX: CYSTOSCOPY W/ RETROGRADES: SHX1426

## 2019-07-02 HISTORY — PX: TRANSURETHRAL RESECTION OF BLADDER TUMOR WITH MITOMYCIN-C: SHX6459

## 2019-07-02 SURGERY — TRANSURETHRAL RESECTION OF BLADDER TUMOR WITH MITOMYCIN-C
Anesthesia: General | Site: Bladder

## 2019-07-02 MED ORDER — PHENYLEPHRINE HCL (PRESSORS) 10 MG/ML IV SOLN
INTRAVENOUS | Status: AC
  Filled 2019-07-02: qty 1

## 2019-07-02 MED ORDER — CEFAZOLIN SODIUM-DEXTROSE 2-4 GM/100ML-% IV SOLN
INTRAVENOUS | Status: AC
  Filled 2019-07-02: qty 100

## 2019-07-02 MED ORDER — DEXAMETHASONE SODIUM PHOSPHATE 10 MG/ML IJ SOLN
INTRAMUSCULAR | Status: DC | PRN
  Administered 2019-07-02: 5 mg via INTRAVENOUS

## 2019-07-02 MED ORDER — GEMCITABINE CHEMO FOR BLADDER INSTILLATION 2000 MG
INTRAVENOUS | Status: DC | PRN
  Administered 2019-07-02: 2000 mg via INTRAVESICAL

## 2019-07-02 MED ORDER — GLYCOPYRROLATE 0.2 MG/ML IJ SOLN
INTRAMUSCULAR | Status: AC
  Filled 2019-07-02: qty 1

## 2019-07-02 MED ORDER — FENTANYL CITRATE (PF) 100 MCG/2ML IJ SOLN
INTRAMUSCULAR | Status: AC
  Filled 2019-07-02: qty 2

## 2019-07-02 MED ORDER — ONDANSETRON HCL 4 MG/2ML IJ SOLN: 4.0000 mg | Freq: Once | INTRAMUSCULAR | Status: DC | PRN

## 2019-07-02 MED ORDER — PROPOFOL 10 MG/ML IV BOLUS
INTRAVENOUS | Status: DC | PRN
  Administered 2019-07-02: 120 mg via INTRAVENOUS

## 2019-07-02 MED ORDER — CHLORHEXIDINE GLUCONATE 0.12 % MT SOLN
15.0000 mL | Freq: Once | OROMUCOSAL | Status: AC
  Administered 2019-07-02: 15 mL via OROMUCOSAL

## 2019-07-02 MED ORDER — GLYCOPYRROLATE 0.2 MG/ML IJ SOLN
INTRAMUSCULAR | Status: DC | PRN
  Administered 2019-07-02: .2 mg via INTRAVENOUS

## 2019-07-02 MED ORDER — LACTATED RINGERS IV SOLN
INTRAVENOUS | Status: DC
  Administered 2019-07-02: 50 mL/h via INTRAVENOUS

## 2019-07-02 MED ORDER — MIDAZOLAM HCL 2 MG/2ML IJ SOLN
INTRAMUSCULAR | Status: DC | PRN
  Administered 2019-07-02: 2 mg via INTRAVENOUS

## 2019-07-02 MED ORDER — ONDANSETRON HCL 4 MG/2ML IJ SOLN
INTRAMUSCULAR | Status: DC | PRN
  Administered 2019-07-02: 4 mg via INTRAVENOUS

## 2019-07-02 MED ORDER — SEVOFLURANE IN SOLN
RESPIRATORY_TRACT | Status: AC
  Filled 2019-07-02: qty 250

## 2019-07-02 MED ORDER — PHENYLEPHRINE HCL (PRESSORS) 10 MG/ML IV SOLN
INTRAVENOUS | Status: DC | PRN
  Administered 2019-07-02 (×5): 100 ug via INTRAVENOUS

## 2019-07-02 MED ORDER — FENTANYL CITRATE (PF) 100 MCG/2ML IJ SOLN
INTRAMUSCULAR | Status: DC | PRN
  Administered 2019-07-02 (×2): 50 ug via INTRAVENOUS

## 2019-07-02 MED ORDER — EPHEDRINE 5 MG/ML INJ
INTRAVENOUS | Status: AC
  Filled 2019-07-02: qty 10

## 2019-07-02 MED ORDER — ONDANSETRON HCL 4 MG/2ML IJ SOLN
INTRAMUSCULAR | Status: AC
  Filled 2019-07-02: qty 2

## 2019-07-02 MED ORDER — FENTANYL CITRATE (PF) 100 MCG/2ML IJ SOLN: 25.0000 ug | INTRAMUSCULAR | Status: DC | PRN

## 2019-07-02 MED ORDER — EPHEDRINE SULFATE 50 MG/ML IJ SOLN
INTRAMUSCULAR | Status: DC | PRN
  Administered 2019-07-02 (×2): 10 mg via INTRAVENOUS

## 2019-07-02 MED ORDER — DEXAMETHASONE SODIUM PHOSPHATE 10 MG/ML IJ SOLN
INTRAMUSCULAR | Status: AC
  Filled 2019-07-02: qty 1

## 2019-07-02 MED ORDER — CEFAZOLIN SODIUM-DEXTROSE 2-4 GM/100ML-% IV SOLN
2.0000 g | INTRAVENOUS | Status: AC
  Administered 2019-07-02: 2 g via INTRAVENOUS

## 2019-07-02 MED ORDER — PROPOFOL 10 MG/ML IV BOLUS
INTRAVENOUS | Status: AC
  Filled 2019-07-02: qty 20

## 2019-07-02 MED ORDER — LIDOCAINE HCL (CARDIAC) PF 100 MG/5ML IV SOSY
PREFILLED_SYRINGE | INTRAVENOUS | Status: DC | PRN
  Administered 2019-07-02: 30 mg via INTRAVENOUS

## 2019-07-02 MED ORDER — ORAL CARE MOUTH RINSE: 15.0000 mL | Freq: Once | OROMUCOSAL | Status: AC

## 2019-07-02 MED ORDER — MIDAZOLAM HCL 2 MG/2ML IJ SOLN
INTRAMUSCULAR | Status: AC
  Filled 2019-07-02: qty 2

## 2019-07-02 SURGICAL SUPPLY — 31 items
BAG DRAIN CYSTO-URO LG1000N (MISCELLANEOUS) ×3 IMPLANT
BAG URINE DRAIN 2000ML AR STRL (UROLOGICAL SUPPLIES) ×3 IMPLANT
BRUSH SCRUB EZ  4% CHG (MISCELLANEOUS) ×1
BRUSH SCRUB EZ 1% IODOPHOR (MISCELLANEOUS) ×3 IMPLANT
BRUSH SCRUB EZ 4% CHG (MISCELLANEOUS) ×2 IMPLANT
CATH FOLEY 2WAY  5CC 16FR (CATHETERS) ×1
CATH URETL 5X70 OPEN END (CATHETERS) ×3 IMPLANT
CATH URTH 16FR FL 2W BLN LF (CATHETERS) ×2 IMPLANT
CRADLE LAMINECT ARM (MISCELLANEOUS) ×3 IMPLANT
DRAPE UTILITY 15X26 TOWEL STRL (DRAPES) ×3 IMPLANT
DRSG TELFA 4X3 1S NADH ST (GAUZE/BANDAGES/DRESSINGS) ×3 IMPLANT
ELECT LOOP 22F BIPOLAR SML (ELECTROSURGICAL) ×3
ELECT REM PT RETURN 9FT ADLT (ELECTROSURGICAL)
ELECTRODE LOOP 22F BIPOLAR SML (ELECTROSURGICAL) IMPLANT
ELECTRODE REM PT RTRN 9FT ADLT (ELECTROSURGICAL) IMPLANT
GLOVE BIO SURGEON STRL SZ 6.5 (GLOVE) ×3 IMPLANT
GOWN STRL REUS W/ TWL LRG LVL3 (GOWN DISPOSABLE) ×4 IMPLANT
GOWN STRL REUS W/TWL LRG LVL3 (GOWN DISPOSABLE) ×2
GUIDEWIRE STR DUAL SENSOR (WIRE) ×3 IMPLANT
KIT TURNOVER CYSTO (KITS) ×3 IMPLANT
LOOP CUT BIPOLAR 24F LRG (ELECTROSURGICAL) IMPLANT
NDL SAFETY ECLIPSE 18X1.5 (NEEDLE) ×2 IMPLANT
NEEDLE HYPO 18GX1.5 SHARP (NEEDLE) ×1
PACK CYSTO AR (MISCELLANEOUS) ×3 IMPLANT
SET CYSTO W/LG BORE CLAMP LF (SET/KITS/TRAYS/PACK) ×3 IMPLANT
SET IRRIG Y TYPE TUR BLADDER L (SET/KITS/TRAYS/PACK) ×3 IMPLANT
SOL .9 NS 3000ML IRR  AL (IV SOLUTION) ×3
SOL .9 NS 3000ML IRR UROMATIC (IV SOLUTION) ×2 IMPLANT
SURGILUBE 2OZ TUBE FLIPTOP (MISCELLANEOUS) ×3 IMPLANT
SYRINGE IRR TOOMEY STRL 70CC (SYRINGE) ×3 IMPLANT
WATER STERILE IRR 1000ML POUR (IV SOLUTION) ×3 IMPLANT

## 2019-07-02 NOTE — Transfer of Care (Signed)
Immediate Anesthesia Transfer of Care Note  Patient: Sarah Rush  Procedure(s) Performed: TRANSURETHRAL RESECTION OF BLADDER TUMOR WITH Gemcitabine (N/A Bladder) CYSTOSCOPY WITH RETROGRADE PYELOGRAM (Bilateral )  Patient Location: PACU  Anesthesia Type:General  Level of Consciousness: awake  Airway & Oxygen Therapy: Patient Spontanous Breathing and Patient connected to nasal cannula oxygen  Post-op Assessment: Report given to RN  Post vital signs: stable  Last Vitals:  Vitals Value Taken Time  BP 128/59 07/02/19 1201  Temp 36.4 C 07/02/19 1200  Pulse 92 07/02/19 1201  Resp 14 07/02/19 1201  SpO2 100 % 07/02/19 1201  Vitals shown include unvalidated device data.  Last Pain:  Vitals:   07/02/19 1000  TempSrc: Tympanic  PainSc: 0-No pain         Complications: No apparent anesthesia complications

## 2019-07-02 NOTE — Anesthesia Procedure Notes (Signed)
Procedure Name: LMA Insertion Date/Time: 07/02/2019 11:08 AM Performed by: Lerry Liner, CRNA Pre-anesthesia Checklist: Patient identified, Emergency Drugs available, Suction available, Patient being monitored and Timeout performed Patient Re-evaluated:Patient Re-evaluated prior to induction Oxygen Delivery Method: Circle system utilized Preoxygenation: Pre-oxygenation with 100% oxygen Induction Type: IV induction Ventilation: Mask ventilation without difficulty LMA: LMA inserted LMA Size: 4.0 Number of attempts: 1 Tube secured with: Tape Dental Injury: Teeth and Oropharynx as per pre-operative assessment

## 2019-07-02 NOTE — Progress Notes (Signed)
Foley removed at [facility]

## 2019-07-02 NOTE — Op Note (Signed)
Date of procedure: 07/02/19  Preoperative diagnosis:  1. History of bladder cancer 2. Bladder tumor x multiple  Postoperative diagnosis:  1. Same as above  Procedure: 1. TURBT, medium 2. Bilateral retrograde pyelogram 3. Instillation of intravesical gemcitabine  Surgeon: Hollice Espy, MD  Anesthesia: General  Complications: None  Intraoperative findings: 5 spherical papillary tumors, 2 on anterior bladder wall near dome with 3 additional round papillary tumors on the anterior and right bladder neck.  Each tumor was approximately 5 to 8 mm in diameter. Normal bilateral retrogrades.  EBL: Minimal  Specimens: Bladder tumor  Drains: 16 French Foley catheter  Indication: Sarah Rush is a 59 y.o. patient with personal history of T1 TCC status post induction BCG found to have recurrent disease on her most recent cystoscopy.  After reviewing the management options for treatment, she elected to proceed with the above surgical procedure(s). We have discussed the potential benefits and risks of the procedure, side effects of the proposed treatment, the likelihood of the patient achieving the goals of the procedure, and any potential problems that might occur during the procedure or recuperation. Informed consent has been obtained.  Description of procedure:  The patient was taken to the operating room and general anesthesia was induced.  The patient was placed in the dorsal lithotomy position, prepped and draped in the usual sterile fashion, and preoperative antibiotics were administered. A preoperative time-out was performed.   A 21 French cystoscope was advanced per urethra into the bladder.  The tumors at the bladder neck were immediately identified upon entering the scope as well as in the anterior bladder wall.  No additional tumors other than the aforementioned were appreciated.  These were round and papillary in nature with almost a spherical-like appearance.  They were all  relatively similar in size measuring up to ~ 8 mm.  Using a 5 [place] open-ended ureteral catheter, each of the UOs were cannulated and gentle retrograde pyelograms were performed on each side.  This revealed no hydroureteronephrosis bilaterally.  There are no filling defects appreciated.  This point time, cold cup biopsy forceps were used to try to piece wise resect particularly the tumors on the anterior bladder wall.  Immediately recognized that this was good to be insufficient given the significant size which was much bigger than the cold cup biopsy forcep with a relatively broad base.  I then exchanged the regular cystoscope for a 26 French resectoscope with continuous irrigation and saline as the medium.  I used a small bipolar loop to take down each of the tumors to the level of what appeared to be the muscularis propria.  The chest was then irrigated out of the bladder and passed off the field as bladder tumor.  Careful and adequate hemostasis then achieved.  There was no concern for active bleeding or perforation.  Finally, 28 French Foley catheter was placed sterilely.  The balloon was then inflated with 10 cc of sterile water.  The patient was then cleaned and dried, repositioned in the supine position, reversed from anesthesia, and taken to the PACU in stable condition.  2000 mg of intravesical gemcitabine was instilled into the bladder.  This was allowed to dwell in the PACU for 1 hour.  This was well-tolerated.  After an hour, the chemotherapeutic agent was drained and the Foley catheter was removed.  Plan: I will call her with her pathology results.  As per previous conversation, she would likely need reinduction BCG which we will go ahead and arrange.  If there is evidence of recurrent T1 disease, she may require reresection.  Findings were discussed with her husband.  Hollice Espy, M.D.

## 2019-07-02 NOTE — Discharge Instructions (Addendum)
Transurethral Resection of Bladder Tumor (TURBT) or Bladder Biopsy ° ° °Definition: ° Transurethral Resection of the Bladder Tumor is a surgical procedure used to diagnose and remove tumors within the bladder. TURBT is the most common treatment for early stage bladder cancer. ° °General instructions: °   ° Your recent bladder surgery requires very little post hospital care but some definite precautions. ° °Despite the fact that no skin incisions were used, the area around the bladder incisions are raw and covered with scabs to promote healing and prevent bleeding. Certain precautions are needed to insure that the scabs are not disturbed over the next 2-4 weeks while the healing proceeds. ° °Because the raw surface inside your bladder and the irritating effects of urine you may expect frequency of urination and/or urgency (a stronger desire to urinate) and perhaps even getting up at night more often. This will usually resolve or improve slowly over the healing period. You may see some blood in your urine over the first 6 weeks. Do not be alarmed, even if the urine was clear for a while. Get off your feet and drink lots of fluids until clearing occurs. If you start to pass clots or don't improve call us. ° °Diet: ° °You may return to your normal diet immediately. Because of the raw surface of your bladder, alcohol, spicy foods, foods high in acid and drinks with caffeine may cause irritation or frequency and should be used in moderation. To keep your urine flowing freely and avoid constipation, drink plenty of fluids during the day (8-10 glasses). Tip: Avoid cranberry juice because it is very acidic. ° °Activity: ° °Your physical activity doesn't need to be restricted. However, if you are very active, you may see some blood in the urine. We suggest that you reduce your activity under the circumstances until the bleeding has stopped. ° °Bowels: ° °It is important to keep your bowels regular during the postoperative  period. Straining with bowel movements can cause bleeding. A bowel movement every other day is reasonable. Use a mild laxative if needed, such as milk of magnesia 2-3 tablespoons, or 2 Dulcolax tablets. Call if you continue to have problems. If you had been taking narcotics for pain, before, during or after your surgery, you may be constipated. Take a laxative if necessary. ° ° ° °Medication: ° °You should resume your pre-surgery medications unless told not to. In addition you may be given an antibiotic to prevent or treat infection. Antibiotics are not always necessary. All medication should be taken as prescribed until the bottles are finished unless you are having an unusual reaction to one of the drugs. ° ° °[facility] °, [place] [address] °[phone] ° ° ° °AMBULATORY SURGERY  °DISCHARGE INSTRUCTIONS ° ° °1) The drugs that you were given will stay in your system until tomorrow so for the next 24 hours you should not: ° °A) Drive an automobile °B) Make any legal decisions °C) Drink any alcoholic beverage ° ° °2) You may resume regular meals tomorrow.  Today it is better to start with liquids and gradually work up to solid foods. ° °You may eat anything you prefer, but it is better to start with liquids, then soup and crackers, and gradually work up to solid foods. ° ° °3) Please notify your doctor immediately if you have any unusual bleeding, trouble breathing, redness and pain at the surgery site, drainage, fever, or pain not relieved by medication. ° ° ° °4) Additional Instructions: ° ° ° ° ° ° ° °  Please contact your physician with any problems or Same Day Surgery at [phone], Monday through Friday 6 am to 4 pm, or [place] [facility] [place] Main number at [phone]. ° °

## 2019-07-02 NOTE — Anesthesia Preprocedure Evaluation (Signed)
Anesthesia Evaluation  Patient identified by MRN, date of birth, ID band Patient awake    Reviewed: Allergy & Precautions, NPO status , Patient's Chart, lab work & pertinent test results  History of Anesthesia Complications Negative for: history of anesthetic complications  Airway Mallampati: II  TM Distance: >3 FB Neck ROM: Full    Dental  (+) Edentulous Upper, Edentulous Lower   Pulmonary neg sleep apnea, neg COPD, former smoker,    breath sounds clear to auscultation- rhonchi (-) wheezing      Cardiovascular (-) hypertension(-) CAD, (-) Past MI, (-) Cardiac Stents and (-) CABG  Rhythm:Regular Rate:Normal - Systolic murmurs and - Diastolic murmurs    Neuro/Psych neg Seizures negative neurological ROS  negative psych ROS   GI/Hepatic Neg liver ROS, GERD  ,  Endo/Other  negative endocrine ROSneg diabetes  Renal/GU negative Renal ROS     Musculoskeletal  (+) Arthritis ,   Abdominal (+) - obese,   Peds  Hematology negative hematology ROS (+)   Anesthesia Other Findings Past Medical History: No date: GERD (gastroesophageal reflux disease) No date: Lower abdominal pain No date: Rheumatoid arthritis (HCC) No date: Urinary tract infection symptoms   Reproductive/Obstetrics                             Anesthesia Physical Anesthesia Plan  ASA: II  Anesthesia Plan: General   Post-op Pain Management:    Induction: Intravenous  PONV Risk Score and Plan: 2 and Ondansetron and Dexamethasone  Airway Management Planned: LMA  Additional Equipment:   Intra-op Plan:   Post-operative Plan:   Informed Consent: I have reviewed the patients History and Physical, chart, labs and discussed the procedure including the risks, benefits and alternatives for the proposed anesthesia with the patient or authorized representative who has indicated his/her understanding and acceptance.     Dental  advisory given  Plan Discussed with: CRNA and Anesthesiologist  Anesthesia Plan Comments:         Anesthesia Quick Evaluation

## 2019-07-02 NOTE — Interval H&P Note (Signed)
History and Physical Interval Note:  07/02/2019 10:53 AM  Sarah Rush  has presented today for surgery, with the diagnosis of bladder cancer.  The various methods of treatment have been discussed with the patient and family. After consideration of risks, benefits and other options for treatment, the patient has consented to  Procedure(s): TRANSURETHRAL RESECTION OF BLADDER TUMOR WITH Gemcitabine (N/A) CYSTOSCOPY WITH RETROGRADE PYELOGRAM (Bilateral) as a surgical intervention.  The patient's history has been reviewed, patient examined, no change in status, stable for surgery.  I have reviewed the patient's chart and labs.  Questions were answered to the patient's satisfaction.    RRR CTAB   Hollice Espy

## 2019-07-02 NOTE — OR Nursing (Signed)
Per Dr. Erlene Quan, secure chat, patient does not nd to void prior to discharge; also, there are no scripts to be written.

## 2019-07-02 NOTE — Progress Notes (Signed)
Patient unclamped

## 2019-07-03 LAB — SURGICAL PATHOLOGY

## 2019-07-03 NOTE — Anesthesia Postprocedure Evaluation (Signed)
Anesthesia Post Note  Patient: Sarah Rush  Procedure(s) Performed: TRANSURETHRAL RESECTION OF BLADDER TUMOR WITH Gemcitabine (N/A Bladder) CYSTOSCOPY WITH RETROGRADE PYELOGRAM (Bilateral )  Patient location during evaluation: PACU Anesthesia Type: General Level of consciousness: awake and alert and oriented Pain management: pain level controlled Vital Signs Assessment: post-procedure vital signs reviewed and stable Respiratory status: spontaneous breathing, nonlabored ventilation and respiratory function stable Cardiovascular status: blood pressure returned to baseline and stable Postop Assessment: no signs of nausea or vomiting Anesthetic complications: no     Last Vitals:  Vitals:   07/02/19 1315 07/02/19 1326  BP: (!) 145/71 (!) 143/88  Pulse: 86 81  Resp:  16  Temp:  36.8 C  SpO2: 94% 96%    Last Pain:  Vitals:   07/02/19 1326  TempSrc:   PainSc: 0-No pain                 Caitlynn Ju

## 2019-07-07 ENCOUNTER — Telehealth: Payer: Self-pay | Admitting: Urology

## 2019-07-07 NOTE — Telephone Encounter (Signed)
Call patient to discuss surgical pathology.  Consistent with high-grade TA TCC.  As per previous conversation, given the very prompt recurrence of her tumors, I have highly recommended another induction course of BCG.  She is agreeable this plan.  We will start this in about a month from now.  I would like to plan to scope her bladder about 2 months after she completes this next course.  She is agreeable this plan.  Hollice Espy, MD

## 2019-08-13 NOTE — Telephone Encounter (Signed)
Discussed BCG instruction with patient and scheduled apts, patient verbalized understanding

## 2019-08-21 ENCOUNTER — Other Ambulatory Visit: Payer: Self-pay

## 2019-08-21 ENCOUNTER — Ambulatory Visit: Payer: Commercial Managed Care - PPO | Admitting: Physician Assistant

## 2019-08-21 DIAGNOSIS — N3001 Acute cystitis with hematuria: Secondary | ICD-10-CM

## 2019-08-21 DIAGNOSIS — D494 Neoplasm of unspecified behavior of bladder: Secondary | ICD-10-CM

## 2019-08-21 LAB — URINALYSIS, COMPLETE
Glucose, UA: NEGATIVE
Ketones, UA: NEGATIVE
Leukocytes,UA: NEGATIVE
Nitrite, UA: NEGATIVE
RBC, UA: NEGATIVE
Specific Gravity, UA: >1.03 — ABNORMAL HIGH (ref 1.005–1.030)
Urobilinogen, Ur: 0.2 mg/dL (ref 0.2–1.0)
pH, UA: 5 (ref 5.0–7.5)

## 2019-08-21 LAB — MICROSCOPIC EXAMINATION

## 2019-08-21 MED ORDER — BCG LIVE 50 MG IS SUSR
3.2400 mL | Freq: Once | INTRAVESICAL | Status: AC
  Administered 2019-08-21: 81 mg via INTRAVESICAL

## 2019-08-21 NOTE — Addendum Note (Signed)
Addended by: Mickle Plumb on: 08/21/2019 03:27 PM   Modules accepted: Level of Service

## 2019-08-21 NOTE — Addendum Note (Signed)
Addended by: Verlene Mayer A on: 08/21/2019 03:25 PM   Modules accepted: Orders

## 2019-08-21 NOTE — Progress Notes (Signed)
BCG Bladder Instillation  BCG # 1 of 6  Due to Bladder Cancer patient is present today for a BCG treatment. Patient was cleaned and prepped in a sterile fashion with betadine. A 14FR catheter was inserted, urine return was noted 90ml, urine was yellow in color.  64ml of reconstituted BCG was instilled into the bladder. The catheter was then removed. Patient tolerated well, no complications were noted  Performed by: Debroah Loop, PA-C   Follow up/ Additional notes: Patient reported recall of prior post-instillation instructions including holding urine for 2 hours, quarter turns, and bleaching the toilet.

## 2019-08-28 ENCOUNTER — Ambulatory Visit (INDEPENDENT_AMBULATORY_CARE_PROVIDER_SITE_OTHER): Payer: Commercial Managed Care - PPO | Admitting: Physician Assistant

## 2019-08-28 ENCOUNTER — Other Ambulatory Visit: Payer: Self-pay

## 2019-08-28 DIAGNOSIS — D494 Neoplasm of unspecified behavior of bladder: Secondary | ICD-10-CM

## 2019-08-28 MED ORDER — SULFAMETHOXAZOLE-TRIMETHOPRIM 800-160 MG PO TABS: 1.0000 | ORAL_TABLET | Freq: Two times a day (BID) | ORAL | 0 refills | Status: AC

## 2019-08-28 NOTE — Progress Notes (Signed)
Patient presented for BCG instillation today, however UA notable for pyuria, microscopic hematuria and bacteriuria.  We will start her on empiric Bactrim DS twice daily x5 days and send for culture for further evaluation.  Patient expressed understanding, will continue scheduled series next week.

## 2019-08-31 ENCOUNTER — Telehealth: Payer: Self-pay | Admitting: Physician Assistant

## 2019-08-31 LAB — MICROSCOPIC EXAMINATION

## 2019-08-31 LAB — CULTURE, URINE COMPREHENSIVE

## 2019-08-31 LAB — URINALYSIS, COMPLETE
Bilirubin, UA: NEGATIVE
Glucose, UA: NEGATIVE
Ketones, UA: NEGATIVE
Nitrite, UA: NEGATIVE
Specific Gravity, UA: >1.03 — ABNORMAL HIGH (ref 1.005–1.030)
Urobilinogen, Ur: 0.2 mg/dL (ref 0.2–1.0)
pH, UA: 5.5 (ref 5.0–7.5)

## 2019-08-31 NOTE — Telephone Encounter (Signed)
Please contact the patient and inform her that her urine culture has come back negative.  She may stop antibiotics.  We will see her later this week for her next BCG installation.

## 2019-08-31 NOTE — Telephone Encounter (Signed)
Notified patient as advised, patient verbalized understanding.  

## 2019-09-04 ENCOUNTER — Other Ambulatory Visit: Payer: Self-pay

## 2019-09-04 ENCOUNTER — Ambulatory Visit (INDEPENDENT_AMBULATORY_CARE_PROVIDER_SITE_OTHER): Payer: Commercial Managed Care - PPO | Admitting: Family Medicine

## 2019-09-04 DIAGNOSIS — D494 Neoplasm of unspecified behavior of bladder: Secondary | ICD-10-CM | POA: Diagnosis not present

## 2019-09-04 LAB — URINALYSIS, COMPLETE
Bilirubin, UA: NEGATIVE
Glucose, UA: NEGATIVE
Ketones, UA: NEGATIVE
Nitrite, UA: NEGATIVE
Specific Gravity, UA: >1.03 — ABNORMAL HIGH (ref 1.005–1.030)
Urobilinogen, Ur: 0.2 mg/dL (ref 0.2–1.0)
pH, UA: 5 (ref 5.0–7.5)

## 2019-09-04 LAB — MICROSCOPIC EXAMINATION: Bacteria, UA: NONE SEEN

## 2019-09-04 MED ORDER — BCG LIVE 50 MG IS SUSR
3.2400 mL | Freq: Once | INTRAVESICAL | Status: AC
Start: 2019-09-04 — End: 2019-09-04
  Administered 2019-09-04: 81 mg via INTRAVESICAL

## 2019-09-04 NOTE — Progress Notes (Signed)
BCG Bladder Instillation  BCG # 2  Due to Bladder Cancer patient is present today for a BCG treatment. Patient was cleaned and prepped in a sterile fashion with betadine. A 14FR catheter was inserted, urine return was noted 71ml, urine was yellow in color.  60ml of reconstituted BCG was instilled into the bladder. The catheter was then removed. Patient tolerated well, no complications were noted  Preformed by: Belva Bertin, CMA  Follow up/ Additional notes: 1 week

## 2019-09-11 ENCOUNTER — Other Ambulatory Visit: Payer: Self-pay

## 2019-09-11 ENCOUNTER — Ambulatory Visit (INDEPENDENT_AMBULATORY_CARE_PROVIDER_SITE_OTHER): Payer: Commercial Managed Care - PPO | Admitting: *Deleted

## 2019-09-11 DIAGNOSIS — D494 Neoplasm of unspecified behavior of bladder: Secondary | ICD-10-CM

## 2019-09-11 LAB — URINALYSIS, COMPLETE
Bilirubin, UA: NEGATIVE
Glucose, UA: NEGATIVE
Ketones, UA: NEGATIVE
Nitrite, UA: NEGATIVE
Protein,UA: NEGATIVE
Specific Gravity, UA: >1.03 — ABNORMAL HIGH (ref 1.005–1.030)
Urobilinogen, Ur: 0.2 mg/dL (ref 0.2–1.0)
pH, UA: 5 (ref 5.0–7.5)

## 2019-09-11 LAB — MICROSCOPIC EXAMINATION

## 2019-09-11 MED ORDER — BCG LIVE 50 MG IS SUSR
3.2400 mL | Freq: Once | INTRAVESICAL | Status: AC
  Administered 2019-09-11: 81 mg via INTRAVESICAL

## 2019-09-11 NOTE — Progress Notes (Signed)
BCG Bladder Instillation  BCG # 3  Due to Bladder Cancer patient is present today for a BCG treatment. Patient was cleaned and prepped in a sterile fashion with betadine. A 14FR catheter was inserted, urine return was noted 51ml, urine was yellow in color.  84ml of reconstituted BCG was instilled into the bladder. The catheter was then removed. Patient tolerated well, no complications were noted  Performed by: Verlene Mayer, CMA and Kerman Passey, CMA  Follow up/ Additional notes: as scheduled

## 2019-09-18 ENCOUNTER — Ambulatory Visit: Payer: Commercial Managed Care - PPO | Admitting: Physician Assistant

## 2019-09-25 ENCOUNTER — Encounter: Payer: Self-pay | Admitting: Physician Assistant

## 2019-09-25 ENCOUNTER — Ambulatory Visit (INDEPENDENT_AMBULATORY_CARE_PROVIDER_SITE_OTHER): Payer: Commercial Managed Care - PPO | Admitting: Physician Assistant

## 2019-09-25 ENCOUNTER — Other Ambulatory Visit: Payer: Self-pay

## 2019-09-25 VITALS — BP 128/78 | HR 73 | Ht 68.0 in | Wt 152.0 lb

## 2019-09-25 DIAGNOSIS — D494 Neoplasm of unspecified behavior of bladder: Secondary | ICD-10-CM | POA: Diagnosis not present

## 2019-09-25 LAB — URINALYSIS, COMPLETE
Bilirubin, UA: NEGATIVE
Glucose, UA: NEGATIVE
Nitrite, UA: NEGATIVE
Specific Gravity, UA: >1.03 — ABNORMAL HIGH (ref 1.005–1.030)
Urobilinogen, Ur: 0.2 mg/dL (ref 0.2–1.0)
pH, UA: 5.5 (ref 5.0–7.5)

## 2019-09-25 LAB — MICROSCOPIC EXAMINATION

## 2019-09-25 MED ORDER — BCG LIVE 50 MG IS SUSR
3.2400 mL | Freq: Once | INTRAVESICAL | Status: AC
  Administered 2019-09-25: 81 mg via INTRAVESICAL

## 2019-09-25 NOTE — Progress Notes (Signed)
BCG Bladder Instillation  BCG # 4  Due to Bladder Cancer patient is present today for a BCG treatment. Patient was cleaned and prepped in a sterile fashion with betadine. A 14FR catheter was inserted, urine return was noted 5ml, urine was YELLOW in color.  26ml of reconstituted BCG was instilled into the bladder. The catheter was then removed. Patient tolerated well, no complications were noted  Preformed by: Edwin Dada, CMA, Fonnie Jarvis, CMA  Follow up/ Additional notes: 1 week

## 2019-10-02 ENCOUNTER — Ambulatory Visit (INDEPENDENT_AMBULATORY_CARE_PROVIDER_SITE_OTHER): Payer: Commercial Managed Care - PPO | Admitting: Physician Assistant

## 2019-10-02 ENCOUNTER — Other Ambulatory Visit: Payer: Self-pay

## 2019-10-02 DIAGNOSIS — D494 Neoplasm of unspecified behavior of bladder: Secondary | ICD-10-CM | POA: Diagnosis not present

## 2019-10-02 LAB — MICROSCOPIC EXAMINATION: Bacteria, UA: NONE SEEN

## 2019-10-02 LAB — URINALYSIS, COMPLETE
Bilirubin, UA: NEGATIVE
Glucose, UA: NEGATIVE
Ketones, UA: NEGATIVE
Leukocytes,UA: NEGATIVE
Nitrite, UA: NEGATIVE
Protein,UA: NEGATIVE
RBC, UA: NEGATIVE
Specific Gravity, UA: >1.03 — ABNORMAL HIGH (ref 1.005–1.030)
Urobilinogen, Ur: 0.2 mg/dL (ref 0.2–1.0)
pH, UA: 5 (ref 5.0–7.5)

## 2019-10-02 MED ORDER — BCG LIVE 50 MG IS SUSR
3.2400 mL | Freq: Once | INTRAVESICAL | Status: AC
  Administered 2019-10-02: 81 mg via INTRAVESICAL

## 2019-10-02 NOTE — Progress Notes (Signed)
BCG Bladder Instillation  BCG # 5 of 6  Due to Bladder Cancer patient is present today for a BCG treatment. Patient was cleaned and prepped in a sterile fashion with betadine. A 14FR catheter was inserted, urine return was noted 60ml, urine was yellow in color.  88ml of reconstituted BCG was instilled into the bladder. The catheter was then removed. Patient tolerated well, no complications were noted  Preformed by: Kerman Passey, RMA  Follow up/ Additional notes: 1 week

## 2019-10-09 ENCOUNTER — Ambulatory Visit (INDEPENDENT_AMBULATORY_CARE_PROVIDER_SITE_OTHER): Payer: Commercial Managed Care - PPO | Admitting: Physician Assistant

## 2019-10-09 ENCOUNTER — Other Ambulatory Visit: Payer: Self-pay

## 2019-10-09 DIAGNOSIS — D494 Neoplasm of unspecified behavior of bladder: Secondary | ICD-10-CM | POA: Diagnosis not present

## 2019-10-09 LAB — URINALYSIS, COMPLETE
Bilirubin, UA: NEGATIVE
Glucose, UA: NEGATIVE
Nitrite, UA: NEGATIVE
Specific Gravity, UA: >1.03 — ABNORMAL HIGH (ref 1.005–1.030)
Urobilinogen, Ur: 0.2 mg/dL (ref 0.2–1.0)
pH, UA: 5 (ref 5.0–7.5)

## 2019-10-09 LAB — MICROSCOPIC EXAMINATION

## 2019-10-09 MED ORDER — BCG LIVE 50 MG IS SUSR
3.2400 mL | Freq: Once | INTRAVESICAL | Status: AC
  Administered 2019-10-09: 81 mg via INTRAVESICAL

## 2019-10-09 NOTE — Progress Notes (Signed)
BCG Bladder Instillation  BCG # 6 of 6  Due to Bladder Cancer patient is present today for a BCG treatment. Patient was cleaned and prepped in a sterile fashion with betadine. A 14FR catheter was inserted, urine return was noted 41ml, urine was yellow in color.  34ml of reconstituted BCG was instilled into the bladder. The catheter was then removed. Patient tolerated well, no complications were noted  Performed by: Debroah Loop, PA-C and DeShannon Tamala Julian, CMA  Follow up/ Additional notes: 2 month cysto (scheduled)

## 2019-12-09 ENCOUNTER — Ambulatory Visit: Payer: Commercial Managed Care - PPO | Admitting: Urology

## 2019-12-09 ENCOUNTER — Encounter: Payer: Self-pay | Admitting: Urology

## 2019-12-09 ENCOUNTER — Other Ambulatory Visit: Payer: Self-pay

## 2019-12-09 VITALS — BP 137/89 | HR 81

## 2019-12-09 DIAGNOSIS — D494 Neoplasm of unspecified behavior of bladder: Secondary | ICD-10-CM | POA: Diagnosis not present

## 2019-12-09 DIAGNOSIS — C671 Malignant neoplasm of dome of bladder: Secondary | ICD-10-CM

## 2019-12-09 NOTE — Progress Notes (Signed)
   12/09/19  CC:  Chief Complaint  Patient presents with  . Cysto    HPI: Sarah Rush is a 59 y.o. F w/ hx of bladder cancer who returns today for cystoscopy surveillance cystoscopy after repeat induction of BCG completed in 09/2019.  She initially presented with microscopic hematuria.  She underwent CT urogram which was concerning for bladder cancer.  She was taken to the operating room in 11/2018 for TURBT with a large tumor at the dome of the bladder.  She received intravesical gemcitabine postoperatively.  3 cm tumor at the dome of the bladder was consistent with high-grade noninvasive urothelial carcinoma with CIS.  Muscularis propria was present and uninvolved.  She underwent induction course of BCG x6 which was well-tolerated.  She is found to have recurrence on follow-up cystoscopy.  She returned to the operating room on 06/2019 for multiple recurrent tumors.  Surgical pathology again was consistent with high-grade TA TCC.  Most recently, she completed a reinduction course of BCG x6.  This was completed in 09/2019.  Her last upper tract imaging was in 11/2018.   Unfortunately, since last visit, she developed urinary symptoms over the past month and is concerned that her tumors were recurred.  Blood pressure 137/89, pulse 81. NED. A&Ox3.   No respiratory distress   Abd soft, NT, ND Normal external genitalia with patent urethral meatus  Cystoscopy Procedure Note  Patient identification was confirmed, informed consent was obtained, and patient was prepped using Betadine solution.  Lidocaine jelly was administered per urethral meatus.    Procedure: - Flexible cystoscope introduced, without any difficulty.   - Thorough search of the bladder revealed:    normal urethral meatus    Spherical well-circumscribed tumor at the dome of the bladder, approximately 2 cm in diameter.  There is adjacent erythema with some low-grade carpeting adjacent to this.    no stones    no  ulcers     no tumors    no urethral polyps    no trabeculation  - Ureteral orifices were normal in position and appearance.  Post-Procedure: - Patient tolerated the procedure well  Assessment/ Plan:   1. 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  I recommended returning to the operating room for TURBT, bilateral retrograde and intravesical gemcitabine for resection of pathologic staging.  Risk and benefits including risk of bleeding, infection, demonstrating structures, bladder irritation amongst others were discussed.  Preoperative UA/urine culture.  Given that she is failed BCG x2 with multiple recurrences, will consider some other sort of intravesical chemotherapy postoperatively for refractory high-grade recurrence.  May also consider repeat upper tract imaging.   Hollice Espy, MD

## 2019-12-09 NOTE — H&P (View-Only) (Signed)
   12/09/19  CC:  Chief Complaint  Patient presents with  . Cysto    HPI: Sarah Rush is a 59 y.o. F w/ hx of bladder cancer who returns today for cystoscopy surveillance cystoscopy after repeat induction of BCG completed in 09/2019.  She initially presented with microscopic hematuria.  She underwent CT urogram which was concerning for bladder cancer.  She was taken to the operating room in 11/2018 for TURBT with a large tumor at the dome of the bladder.  She received intravesical gemcitabine postoperatively.  3 cm tumor at the dome of the bladder was consistent with high-grade noninvasive urothelial carcinoma with CIS.  Muscularis propria was present and uninvolved.  She underwent induction course of BCG x6 which was well-tolerated.  She is found to have recurrence on follow-up cystoscopy.  She returned to the operating room on 06/2019 for multiple recurrent tumors.  Surgical pathology again was consistent with high-grade TA TCC.  Most recently, she completed a reinduction course of BCG x6.  This was completed in 09/2019.  Her last upper tract imaging was in 11/2018.   Unfortunately, since last visit, she developed urinary symptoms over the past month and is concerned that her tumors were recurred.  Blood pressure 137/89, pulse 81. NED. A&Ox3.   No respiratory distress   Abd soft, NT, ND Normal external genitalia with patent urethral meatus  Cystoscopy Procedure Note  Patient identification was confirmed, informed consent was obtained, and patient was prepped using Betadine solution.  Lidocaine jelly was administered per urethral meatus.    Procedure: - Flexible cystoscope introduced, without any difficulty.   - Thorough search of the bladder revealed:    normal urethral meatus    Spherical well-circumscribed tumor at the dome of the bladder, approximately 2 cm in diameter.  There is adjacent erythema with some low-grade carpeting adjacent to this.    no stones    no  ulcers     no tumors    no urethral polyps    no trabeculation  - Ureteral orifices were normal in position and appearance.  Post-Procedure: - Patient tolerated the procedure well  Assessment/ Plan:   1. 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  I recommended returning to the operating room for TURBT, bilateral retrograde and intravesical gemcitabine for resection of pathologic staging.  Risk and benefits including risk of bleeding, infection, demonstrating structures, bladder irritation amongst others were discussed.  Preoperative UA/urine culture.  Given that she is failed BCG x2 with multiple recurrences, will consider some other sort of intravesical chemotherapy postoperatively for refractory high-grade recurrence.  May also consider repeat upper tract imaging.   Hollice Espy, MD

## 2019-12-10 ENCOUNTER — Other Ambulatory Visit: Payer: Self-pay | Admitting: Radiology

## 2019-12-10 DIAGNOSIS — C671 Malignant neoplasm of dome of bladder: Secondary | ICD-10-CM

## 2019-12-10 DIAGNOSIS — D494 Neoplasm of unspecified behavior of bladder: Secondary | ICD-10-CM

## 2019-12-10 LAB — URINALYSIS, COMPLETE
Bilirubin, UA: NEGATIVE
Glucose, UA: NEGATIVE
Ketones, UA: NEGATIVE
Leukocytes,UA: NEGATIVE
Nitrite, UA: NEGATIVE
RBC, UA: NEGATIVE
Specific Gravity, UA: >1.03 — ABNORMAL HIGH (ref 1.005–1.030)
Urobilinogen, Ur: 0.2 mg/dL (ref 0.2–1.0)
pH, UA: 5.5 (ref 5.0–7.5)

## 2019-12-10 LAB — MICROSCOPIC EXAMINATION

## 2019-12-10 MED ORDER — GEMCITABINE CHEMO FOR BLADDER INSTILLATION 2000 MG: 2000.0000 mg | Freq: Once | INTRAVENOUS | Status: DC

## 2019-12-12 LAB — CULTURE, URINE COMPREHENSIVE

## 2019-12-14 ENCOUNTER — Other Ambulatory Visit
Admission: RE | Admit: 2019-12-14 | Discharge: 2019-12-14 | Disposition: A | Discharge status: Home / Self Care | Payer: Commercial Managed Care - PPO | Source: Ambulatory Visit | Attending: Urology | Admitting: Urology

## 2019-12-14 ENCOUNTER — Other Ambulatory Visit: Payer: Self-pay

## 2019-12-14 HISTORY — DX: Anxiety disorder, unspecified: F41.9

## 2019-12-14 NOTE — Patient Instructions (Signed)
Your procedure is scheduled on: Monday December 21, 2019. Report to Day Surgery inside [facility] 2nd floor. To find out your arrival time please call [phone] between 1PM - 3PM on Friday December 18, 2019.  Remember: Instructions that are not followed completely may result in serious medical risk,  up to and including death, or upon the discretion of your surgeon and anesthesiologist your  surgery may need to be rescheduled.     _X__ 1. Do not eat food after midnight the night before your procedure.                 No chewing gum or hard candies. You may drink clear liquids up to 2 hours                 before you are scheduled to arrive for your surgery- DO not drink clear                 liquids within 2 hours of the start of your surgery.                 Clear Liquids include:  water, apple juice without pulp, clear Gatorade, G2 or                  Gatorade Zero (avoid Red/Purple/Blue), Black Coffee or Tea (Do not add                 anything to coffee or tea).  __X__2.  On the morning of surgery brush your teeth with toothpaste and water, you                may rinse your mouth with mouthwash if you wish.  Do not swallow any toothpaste of mouthwash.     _X__ 3.  No Alcohol for 24 hours before or after surgery.   _X__ 4.  Do Not Smoke or use e-cigarettes For 24 Hours Prior to Your Surgery.                 Do not use any chewable tobacco products for at least 6 hours prior to                 Surgery.  _X__  5.  Do not use any recreational drugs (marijuana, cocaine, heroin, ecstasy, MDMA or other)                For at least one week prior to your surgery.  Combination of these drugs with anesthesia                May have life threatening results.  __X__ 6.  Notify your doctor if there is any change in your medical condition      (cold, fever, infections).     Do not wear jewelry, make-up, hairpins, clips or nail polish. Do not wear lotions, powders,  or perfumes. You may wear deodorant. Do not shave 48 hours prior to surgery. Men may shave face and neck. Do not bring valuables to the hospital.    [facility] is not responsible for any belongings or valuables.  Contacts, dentures or bridgework may not be worn into surgery. Leave your suitcase in the car. After surgery it may be brought to your room. For patients admitted to the hospital, discharge time is determined by your treatment team.   Patients discharged the day of surgery will not be allowed to drive home.   Make arrangements for someone to be  with you for the first 24 hours of your Same Day Discharge.   __X__ Take these medicines the morning of surgery with A SIP OF WATER:    1. omeprazole (PRILOSEC) 20 MG  ____ Fleet Enema (as directed)   ____ Use CHG Soap (or wipes) as directed  ____ Use Benzoyl Peroxide Gel as instructed  ____ Use inhalers on the day of surgery  ____ Stop metformin 2 days prior to surgery    ____ Take 1/2 of usual insulin dose the night before surgery. No insulin the morning          of surgery.   ____ Stop Coumadin/Plavix/aspirin   __X__ Stop Anti-inflammatories such as ibuprofen (ADVIL), Aleve, naproxen, aspirin and or BC powders.    __X__ Stop supplements until after surgery.    __X__ Do not start any herbal supplements before your procedure.    If you have any questions regarding your pre-procedure instructions,  Please call Pre-admit Testing at [phone].

## 2019-12-17 ENCOUNTER — Other Ambulatory Visit: Payer: Self-pay

## 2019-12-17 ENCOUNTER — Other Ambulatory Visit
Admission: RE | Admit: 2019-12-17 | Discharge: 2019-12-17 | Disposition: A | Discharge status: Home / Self Care | Payer: Commercial Managed Care - PPO | Source: Ambulatory Visit | Attending: Urology | Admitting: Urology

## 2019-12-17 DIAGNOSIS — Z01812 Encounter for preprocedural laboratory examination: Secondary | ICD-10-CM | POA: Diagnosis present

## 2019-12-17 DIAGNOSIS — Z20822 Contact with and (suspected) exposure to covid-19: Secondary | ICD-10-CM | POA: Diagnosis not present

## 2019-12-18 LAB — SARS CORONAVIRUS 2 (TAT 6-24 HRS): SARS Coronavirus 2: NEGATIVE

## 2019-12-20 MED ORDER — CEFAZOLIN SODIUM-DEXTROSE 2-4 GM/100ML-% IV SOLN
2.0000 g | INTRAVENOUS | Status: AC
  Administered 2019-12-21: 2 g via INTRAVENOUS

## 2019-12-20 MED ORDER — LACTATED RINGERS IV SOLN: INTRAVENOUS | Status: DC

## 2019-12-20 MED ORDER — ORAL CARE MOUTH RINSE: 15.0000 mL | Freq: Once | OROMUCOSAL | Status: AC

## 2019-12-20 MED ORDER — CHLORHEXIDINE GLUCONATE 0.12 % MT SOLN: 15.0000 mL | Freq: Once | OROMUCOSAL | Status: AC

## 2019-12-21 ENCOUNTER — Encounter
Admission: RE | Disposition: A | Discharge status: Home / Self Care | Payer: Self-pay | Source: Home / Self Care | Attending: Urology

## 2019-12-21 ENCOUNTER — Ambulatory Visit: Payer: Commercial Managed Care - PPO | Admitting: Certified Registered"

## 2019-12-21 ENCOUNTER — Encounter: Payer: Self-pay | Admitting: Urology

## 2019-12-21 ENCOUNTER — Ambulatory Visit: Payer: Commercial Managed Care - PPO

## 2019-12-21 ENCOUNTER — Ambulatory Visit
Admission: RE | Admit: 2019-12-21 | Discharge: 2019-12-21 | Disposition: A | Discharge status: Home / Self Care | Payer: Commercial Managed Care - PPO | Attending: Urology | Admitting: Urology

## 2019-12-21 ENCOUNTER — Other Ambulatory Visit: Payer: Self-pay

## 2019-12-21 DIAGNOSIS — C675 Malignant neoplasm of bladder neck: Secondary | ICD-10-CM | POA: Insufficient documentation

## 2019-12-21 DIAGNOSIS — D494 Neoplasm of unspecified behavior of bladder: Secondary | ICD-10-CM

## 2019-12-21 DIAGNOSIS — N1370 Vesicoureteral-reflux, unspecified: Secondary | ICD-10-CM | POA: Insufficient documentation

## 2019-12-21 DIAGNOSIS — C671 Malignant neoplasm of dome of bladder: Secondary | ICD-10-CM | POA: Insufficient documentation

## 2019-12-21 DIAGNOSIS — Z87891 Personal history of nicotine dependence: Secondary | ICD-10-CM | POA: Insufficient documentation

## 2019-12-21 DIAGNOSIS — Z8551 Personal history of malignant neoplasm of bladder: Secondary | ICD-10-CM | POA: Insufficient documentation

## 2019-12-21 HISTORY — PX: TRANSURETHRAL RESECTION OF BLADDER TUMOR WITH MITOMYCIN-C: SHX6459

## 2019-12-21 HISTORY — PX: CYSTOSCOPY W/ RETROGRADES: SHX1426

## 2019-12-21 SURGERY — TRANSURETHRAL RESECTION OF BLADDER TUMOR WITH MITOMYCIN-C
Anesthesia: General

## 2019-12-21 MED ORDER — FENTANYL CITRATE (PF) 100 MCG/2ML IJ SOLN: 25.0000 ug | INTRAMUSCULAR | Status: DC | PRN

## 2019-12-21 MED ORDER — EPHEDRINE SULFATE 50 MG/ML IJ SOLN
INTRAMUSCULAR | Status: DC | PRN
  Administered 2019-12-21 (×2): 10 mg via INTRAVENOUS

## 2019-12-21 MED ORDER — FENTANYL CITRATE (PF) 100 MCG/2ML IJ SOLN
INTRAMUSCULAR | Status: DC | PRN
  Administered 2019-12-21: 25 ug via INTRAVENOUS
  Administered 2019-12-21 (×2): 50 ug via INTRAVENOUS
  Administered 2019-12-21: 25 ug via INTRAVENOUS
  Administered 2019-12-21: 50 ug via INTRAVENOUS

## 2019-12-21 MED ORDER — CHLORHEXIDINE GLUCONATE 0.12 % MT SOLN
OROMUCOSAL | Status: AC
  Administered 2019-12-21: 15 mL via OROMUCOSAL
  Filled 2019-12-21: qty 15

## 2019-12-21 MED ORDER — FENTANYL CITRATE (PF) 100 MCG/2ML IJ SOLN
INTRAMUSCULAR | Status: AC
  Filled 2019-12-21: qty 2

## 2019-12-21 MED ORDER — DEXAMETHASONE SODIUM PHOSPHATE 10 MG/ML IJ SOLN
INTRAMUSCULAR | Status: AC
  Filled 2019-12-21: qty 1

## 2019-12-21 MED ORDER — PROMETHAZINE HCL 25 MG/ML IJ SOLN: 6.2500 mg | INTRAMUSCULAR | Status: DC | PRN

## 2019-12-21 MED ORDER — OXYCODONE HCL 5 MG/5ML PO SOLN: 5.0000 mg | Freq: Once | ORAL | Status: DC | PRN

## 2019-12-21 MED ORDER — EPHEDRINE 5 MG/ML INJ
INTRAVENOUS | Status: AC
  Filled 2019-12-21: qty 10

## 2019-12-21 MED ORDER — OXYCODONE HCL 5 MG PO TABS: 5.0000 mg | ORAL_TABLET | Freq: Once | ORAL | Status: DC | PRN

## 2019-12-21 MED ORDER — ONDANSETRON HCL 4 MG/2ML IJ SOLN
INTRAMUSCULAR | Status: AC
  Filled 2019-12-21: qty 2

## 2019-12-21 MED ORDER — PHENYLEPHRINE HCL (PRESSORS) 10 MG/ML IV SOLN
INTRAVENOUS | Status: DC | PRN
  Administered 2019-12-21: 100 ug via INTRAVENOUS

## 2019-12-21 MED ORDER — ONDANSETRON HCL 4 MG/2ML IJ SOLN
INTRAMUSCULAR | Status: DC | PRN
  Administered 2019-12-21: 4 mg via INTRAVENOUS

## 2019-12-21 MED ORDER — GLYCOPYRROLATE 0.2 MG/ML IJ SOLN
INTRAMUSCULAR | Status: DC | PRN
  Administered 2019-12-21: .2 mg via INTRAVENOUS

## 2019-12-21 MED ORDER — GLYCOPYRROLATE 0.2 MG/ML IJ SOLN
INTRAMUSCULAR | Status: AC
  Filled 2019-12-21: qty 1

## 2019-12-21 MED ORDER — DEXMEDETOMIDINE (PRECEDEX) IN NS 20 MCG/5ML (4 MCG/ML) IV SYRINGE
PREFILLED_SYRINGE | INTRAVENOUS | Status: DC | PRN
  Administered 2019-12-21: 8 ug via INTRAVENOUS
  Administered 2019-12-21: 12 ug via INTRAVENOUS

## 2019-12-21 MED ORDER — LIDOCAINE HCL (CARDIAC) PF 100 MG/5ML IV SOSY
PREFILLED_SYRINGE | INTRAVENOUS | Status: DC | PRN
  Administered 2019-12-21: 60 mg via INTRAVENOUS

## 2019-12-21 MED ORDER — GEMCITABINE CHEMO FOR BLADDER INSTILLATION 2000 MG
INTRAVENOUS | Status: DC | PRN
  Administered 2019-12-21: 2000 mg via INTRAVESICAL

## 2019-12-21 MED ORDER — MEPERIDINE HCL 50 MG/ML IJ SOLN: 6.2500 mg | INTRAMUSCULAR | Status: DC | PRN

## 2019-12-21 MED ORDER — IOHEXOL 180 MG/ML  SOLN
INTRAMUSCULAR | Status: DC | PRN
  Administered 2019-12-21 (×2): 40 mL

## 2019-12-21 MED ORDER — LIDOCAINE HCL (PF) 2 % IJ SOLN
INTRAMUSCULAR | Status: AC
  Filled 2019-12-21: qty 5

## 2019-12-21 MED ORDER — CEFAZOLIN SODIUM-DEXTROSE 2-4 GM/100ML-% IV SOLN
INTRAVENOUS | Status: AC
  Filled 2019-12-21: qty 100

## 2019-12-21 MED ORDER — MIDAZOLAM HCL 2 MG/2ML IJ SOLN
INTRAMUSCULAR | Status: AC
  Filled 2019-12-21: qty 2

## 2019-12-21 MED ORDER — DEXAMETHASONE SODIUM PHOSPHATE 10 MG/ML IJ SOLN
INTRAMUSCULAR | Status: DC | PRN
  Administered 2019-12-21: 5 mg via INTRAVENOUS

## 2019-12-21 MED ORDER — PROPOFOL 10 MG/ML IV BOLUS
INTRAVENOUS | Status: DC | PRN
  Administered 2019-12-21: 150 mg via INTRAVENOUS
  Administered 2019-12-21: 50 mg via INTRAVENOUS

## 2019-12-21 MED ORDER — PROPOFOL 10 MG/ML IV BOLUS
INTRAVENOUS | Status: AC
  Filled 2019-12-21: qty 20

## 2019-12-21 MED ORDER — DEXMEDETOMIDINE (PRECEDEX) IN NS 20 MCG/5ML (4 MCG/ML) IV SYRINGE
PREFILLED_SYRINGE | INTRAVENOUS | Status: AC
  Filled 2019-12-21: qty 5

## 2019-12-21 MED ORDER — MIDAZOLAM HCL 2 MG/2ML IJ SOLN
INTRAMUSCULAR | Status: DC | PRN
  Administered 2019-12-21: 2 mg via INTRAVENOUS

## 2019-12-21 SURGICAL SUPPLY — 34 items
BAG DRAIN CYSTO-URO LG1000N (MISCELLANEOUS) ×3 IMPLANT
BAG DRN RND TRDRP ANRFLXCHMBR (UROLOGICAL SUPPLIES) ×2
BAG URINE DRAIN 2000ML AR STRL (UROLOGICAL SUPPLIES) ×3 IMPLANT
BRUSH SCRUB EZ 1% IODOPHOR (MISCELLANEOUS) ×3 IMPLANT
CATH FOLEY 2WAY  5CC 16FR (CATHETERS) ×1
CATH FOLEY 2WAY 5CC 16FR (CATHETERS) ×2
CATH URETL 5X70 OPEN END (CATHETERS) ×3 IMPLANT
CATH URTH 16FR FL 2W BLN LF (CATHETERS) ×2 IMPLANT
DRAPE UTILITY 15X26 TOWEL STRL (DRAPES) IMPLANT
DRSG TELFA 4X3 1S NADH ST (GAUZE/BANDAGES/DRESSINGS) ×3 IMPLANT
ELECT LOOP 22F BIPOLAR SML (ELECTROSURGICAL) ×3
ELECT REM PT RETURN 9FT ADLT (ELECTROSURGICAL) ×3
ELECTRODE LOOP 22F BIPOLAR SML (ELECTROSURGICAL) ×2 IMPLANT
ELECTRODE REM PT RTRN 9FT ADLT (ELECTROSURGICAL) ×2 IMPLANT
GLOVE BIO SURGEON STRL SZ 6.5 (GLOVE) ×3 IMPLANT
GOWN STRL REUS W/ TWL LRG LVL3 (GOWN DISPOSABLE) ×4 IMPLANT
GOWN STRL REUS W/TWL LRG LVL3 (GOWN DISPOSABLE) ×6
GUIDEWIRE STR DUAL SENSOR (WIRE) ×3 IMPLANT
IV NS IRRIG 3000ML ARTHROMATIC (IV SOLUTION) ×6 IMPLANT
KIT TURNOVER CYSTO (KITS) ×3 IMPLANT
LOOP CUT BIPOLAR 24F LRG (ELECTROSURGICAL) IMPLANT
NDL SAFETY ECLIPSE 18X1.5 (NEEDLE) ×2 IMPLANT
NEEDLE HYPO 18GX1.5 SHARP (NEEDLE) ×3
PACK CYSTO AR (MISCELLANEOUS) ×3 IMPLANT
SET CYSTO W/LG BORE CLAMP LF (SET/KITS/TRAYS/PACK) ×3 IMPLANT
SET IRRIG Y TYPE TUR BLADDER L (SET/KITS/TRAYS/PACK) ×3 IMPLANT
SOL .9 NS 3000ML IRR  AL (IV SOLUTION)
SOL .9 NS 3000ML IRR AL (IV SOLUTION)
SOL .9 NS 3000ML IRR UROMATIC (IV SOLUTION) IMPLANT
SURGILUBE 2OZ TUBE FLIPTOP (MISCELLANEOUS) ×3 IMPLANT
SYR TOOMEY IRRIG 70ML (MISCELLANEOUS) ×3
SYRINGE TOOMEY IRRIG 70ML (MISCELLANEOUS) ×2 IMPLANT
WATER STERILE IRR 1000ML POUR (IV SOLUTION) ×3 IMPLANT
WATER STERILE IRR 3000ML UROMA (IV SOLUTION) ×6 IMPLANT

## 2019-12-21 NOTE — Progress Notes (Signed)
Foley unclamped, drained and d/c per order.  Pt tolerated well.  VSS, NAD, denies pain

## 2019-12-21 NOTE — Transfer of Care (Signed)
Immediate Anesthesia Transfer of Care Note  Patient: Sarah Rush  Procedure(s) Performed: TRANSURETHRAL RESECTION OF BLADDER TUMOR WITH Gemcitabine (N/A ) CYSTOSCOPY WITH RETROGRADE PYELOGRAM (Bilateral )  Patient Location: PACU  Anesthesia Type:General  Level of Consciousness: awake  Airway & Oxygen Therapy: Patient Spontanous Breathing and Patient connected to face mask oxygen  Post-op Assessment: Report given to RN  Post vital signs: stable  Last Vitals:  Vitals Value Taken Time  BP 128/80 12/21/19 1556  Temp    Pulse 89 12/21/19 1558  Resp 14 12/21/19 1558  SpO2 100 % 12/21/19 1558  Vitals shown include unvalidated device data.  Last Pain:  Vitals:   12/21/19 1556  TempSrc:   PainSc: (P) 0-No pain         Complications: No complications documented.

## 2019-12-21 NOTE — Anesthesia Postprocedure Evaluation (Signed)
Anesthesia Post Note  Patient: Sarah Rush  Procedure(s) Performed: TRANSURETHRAL RESECTION OF BLADDER TUMOR WITH Gemcitabine (N/A ) CYSTOSCOPY WITH RETROGRADE PYELOGRAM (Bilateral )  Patient location during evaluation: PACU Anesthesia Type: General Level of consciousness: awake and alert Pain management: pain level controlled Vital Signs Assessment: post-procedure vital signs reviewed and stable Respiratory status: spontaneous breathing, nonlabored ventilation, respiratory function stable and patient connected to nasal cannula oxygen Cardiovascular status: blood pressure returned to baseline and stable Postop Assessment: no apparent nausea or vomiting Anesthetic complications: no   No complications documented.   Last Vitals:  Vitals:   12/21/19 1613 12/21/19 1617  BP: (!) 144/66   Pulse: 87 79  Resp: 10 16  Temp:    SpO2: 98% 98%    Last Pain:  Vitals:   12/21/19 1617  TempSrc:   PainSc: 0-No pain                 Arita Miss

## 2019-12-21 NOTE — Interval H&P Note (Signed)
History and Physical Interval Note:  12/21/2019 2:21 PM  Sarah Rush  has presented today for surgery, with the diagnosis of bladder cancer,  bladder tumor.  The various methods of treatment have been discussed with the patient and family. After consideration of risks, benefits and other options for treatment, the patient has consented to  Procedure(s): TRANSURETHRAL RESECTION OF BLADDER TUMOR WITH Gemcitabine (N/A) CYSTOSCOPY WITH RETROGRADE PYELOGRAM (Bilateral) as a surgical intervention.  The patient's history has been reviewed, patient examined, no change in status, stable for surgery.  I have reviewed the patient's chart and labs.  Questions were answered to the patient's satisfaction.    RRR CTAB   Hollice Espy

## 2019-12-21 NOTE — Anesthesia Procedure Notes (Signed)
Procedure Name: LMA Insertion Date/Time: 12/21/2019 2:50 PM Performed by: Lerry Liner, CRNA Pre-anesthesia Checklist: Patient identified, Emergency Drugs available, Suction available and Patient being monitored Patient Re-evaluated:Patient Re-evaluated prior to induction Oxygen Delivery Method: Circle system utilized Preoxygenation: Pre-oxygenation with 100% oxygen Induction Type: IV induction Ventilation: Mask ventilation without difficulty LMA: LMA inserted LMA Size: 3.5 Number of attempts: 1 Tube secured with: Tape Dental Injury: Teeth and Oropharynx as per pre-operative assessment

## 2019-12-21 NOTE — Anesthesia Preprocedure Evaluation (Signed)
Anesthesia Evaluation  Patient identified by MRN, date of birth, ID band Patient awake    Reviewed: Allergy & Precautions, NPO status , Patient's Chart, lab work & pertinent test results  History of Anesthesia Complications Negative for: history of anesthetic complications  Airway Mallampati: II  TM Distance: >3 FB Neck ROM: Full    Dental  (+) Edentulous Upper, Edentulous Lower   Pulmonary neg sleep apnea, neg COPD, former smoker,    breath sounds clear to auscultation- rhonchi (-) wheezing      Cardiovascular Exercise Tolerance: Good (-) hypertension(-) CAD, (-) Past MI, (-) Cardiac Stents and (-) CABG  Rhythm:Regular Rate:Normal - Systolic murmurs and - Diastolic murmurs    Neuro/Psych neg Seizures Anxiety negative neurological ROS     GI/Hepatic Neg liver ROS, GERD  ,  Endo/Other  negative endocrine ROS  Renal/GU negative Renal ROS     Musculoskeletal  (+) Arthritis ,   Abdominal (+) - obese,   Peds  Hematology negative hematology ROS (+)   Anesthesia Other Findings Past Medical History: No date: Anxiety No date: GERD (gastroesophageal reflux disease) No date: Lower abdominal pain No date: Rheumatoid arthritis (HCC) No date: Urinary tract infection symptoms   Reproductive/Obstetrics                             Anesthesia Physical Anesthesia Plan  ASA: II  Anesthesia Plan: General   Post-op Pain Management:    Induction: Intravenous  PONV Risk Score and Plan: 2 and Dexamethasone and Ondansetron  Airway Management Planned: LMA  Additional Equipment:   Intra-op Plan:   Post-operative Plan:   Informed Consent: I have reviewed the patients History and Physical, chart, labs and discussed the procedure including the risks, benefits and alternatives for the proposed anesthesia with the patient or authorized representative who has indicated his/her understanding and  acceptance.     Dental advisory given  Plan Discussed with: CRNA and Anesthesiologist  Anesthesia Plan Comments:         Anesthesia Quick Evaluation

## 2019-12-21 NOTE — Op Note (Signed)
Date of procedure: 12/21/19  Preoperative diagnosis:  1. History of bladder cancer 2. Bladder tumor, dome 3. Bladder tumor, bladder neck  Postoperative diagnosis:  1. Same as above 2. Right vesicoureteral reflux  Procedure:  1.  TURBT, medium 2.  Bilateral retrograde pyelogram 3.  Cystogram 4.  Instillation of intravesical chemotherapy   Surgeon: Hollice Espy, MD  Anesthesia: General  Complications: None  Intraoperative findings: Large 2 cm spherical tumor at dome of bladder along with small satellite lesion.  Approximate 1.5 cm bladder neck tumor possibly involving the proximal urethra from the 3:00 to 9 o'clock position.  Capacious right UO.  EBL: Minimal  Specimens: Bladder tumor dome, bladder tumor dome satellite lesion, bladder neck tumor  Drains: 16 French Foley catheter  Indication: Sarah Rush is a 59 y.o. 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  After reviewing the management options for treatment, he elected to proceed with the above surgical procedure(s). We have discussed the potential benefits and risks of the procedure, side effects of the proposed treatment, the likelihood of the patient achieving the goals of the procedure, and any potential problems that might occur during the procedure or recuperation. Informed consent has been obtained.  Description of procedure:  The patient was taken to the operating room and general anesthesia was induced.  The patient was placed in the dorsal lithotomy position, prepped and draped in the usual sterile fashion, and preoperative antibiotics were administered. A preoperative time-out was performed.   A 21 French scope was advanced per urethra into the bladder.  The bladder was carefully inspected.  The right UO had a capacious appearance.  There is a tumor which was the most obvious at the dome of the  bladder which is completely spherical on a relatively narrow stalk.  This measured about 2 cm.  Adjacent to this, there was a satellite lesion which is more broad-based measuring less than 1 cm just to the left of the dominant spherical tumor.  There is also tumor at the bladder neck encroaching upon the proximal ureter.  This extended from the 3:00 to 6:00 positions around the bladder neck and measured approximately 1.5 cm.  This was also on a relatively broad base and was papillary in nature.  At this point time, attention was turned to the right UO.  Gentle retrograde pyelogram was performed.  This revealed no hydroureteronephrosis or filling defects on this side.  The same exact procedure was performed on the left.  There is no hydroureteronephrosis or filling defects on this side.  At this point in time, the use cold cup biopsy forceps to try to piece wise resect the largest most vehicle tumor at the dome.  Eventually I was successful in doing so and was able to remove the bulk of the tumor en bloc and pulled it out directly through the urethra which was barely able to accommodate this tumor.  Notably, it had been piece wise resected somewhat in order to pull the most dominant tumor out.  The adjacent satellite lesion was also enucleated and a piece wise fashion using cold cup biopsy forceps.  And then tried to resect the tumor at the bladder neck using the same technique but was not successful to the somewhat tricky location.  As such, I elected to bring in a bipolar.  I went ahead and achieved electrocautery hemostasis using the bipolar loop and saline as  the medium at the dome.  Notably, this is relatively deep dissection a small amount of fat was seen as well as muscularis propria fibers.  The bipolar loop was then used to TUR resect the bladder neck tumor which extended towards the proximal urethra which was encroaching but likely not involving this structure.  In order to ensure that the tumor had  been completely resected, I did use a 70 degree lens which allowed me to achieve excellent visualization ensure that all tumor had been fully resected.  All bladder chips were then evacuated from the bladder.  Finally, 1 last vision inspection of the bladder revealed no additional tumors.  Because the resection of the dome was relatively deep, I did elect to go ahead and perform a cystogram.  The bladder was filled with approximately 300 cc of partially diluted contrast solution.  X-ray images revealed the contrast material retained within the bladder.  There was vesicoureteral reflux all the way up to the renal pelvis on the right side.  Once the bladder was drained, postdrainage films indicated no additional contrast and no concern for perforation.  As such, 62 French Foley catheter was inserted the patient was cleaned and dried, repositioned in the supine position, reversed myesthesia and taken to the PACU in stable condition.  In the PACU, 2000 mg of intravesical gemcitabine was instilled into the bladder.  Was allowed to dwell for 1 hour and then drained.  Plan: I will have the patient follow-up with me next week.  Given her multiple recurrent tumors which are high-grade in nature and status post BCG induction x2, she will likely benefit from alternative treatments which she can discuss further once we have received her pathology.  Hollice Espy, M.D.

## 2019-12-21 NOTE — Discharge Instructions (Signed)
Transurethral Resection of Bladder Tumor (TURBT) or Bladder Biopsy ° ° °Definition: ° Transurethral Resection of the Bladder Tumor is a surgical procedure used to diagnose and remove tumors within the bladder. TURBT is the most common treatment for early stage bladder cancer. ° °General instructions: °   ° Your recent bladder surgery requires very little post hospital care but some definite precautions. ° °Despite the fact that no skin incisions were used, the area around the bladder incisions are raw and covered with scabs to promote healing and prevent bleeding. Certain precautions are needed to insure that the scabs are not disturbed over the next 2-4 weeks while the healing proceeds. ° °Because the raw surface inside your bladder and the irritating effects of urine you may expect frequency of urination and/or urgency (a stronger desire to urinate) and perhaps even getting up at night more often. This will usually resolve or improve slowly over the healing period. You may see some blood in your urine over the first 6 weeks. Do not be alarmed, even if the urine was clear for a while. Get off your feet and drink lots of fluids until clearing occurs. If you start to pass clots or don't improve call us. ° °Diet: ° °You may return to your normal diet immediately. Because of the raw surface of your bladder, alcohol, spicy foods, foods high in acid and drinks with caffeine may cause irritation or frequency and should be used in moderation. To keep your urine flowing freely and avoid constipation, drink plenty of fluids during the day (8-10 glasses). Tip: Avoid cranberry juice because it is very acidic. ° °Activity: ° °Your physical activity doesn't need to be restricted. However, if you are very active, you may see some blood in the urine. We suggest that you reduce your activity under the circumstances until the bleeding has stopped. ° °Bowels: ° °It is important to keep your bowels regular during the postoperative  period. Straining with bowel movements can cause bleeding. A bowel movement every other day is reasonable. Use a mild laxative if needed, such as milk of magnesia 2-3 tablespoons, or 2 Dulcolax tablets. Call if you continue to have problems. If you had been taking narcotics for pain, before, during or after your surgery, you may be constipated. Take a laxative if necessary. ° ° ° °Medication: ° °You should resume your pre-surgery medications unless told not to. In addition you may be given an antibiotic to prevent or treat infection. Antibiotics are not always necessary. All medication should be taken as prescribed until the bottles are finished unless you are having an unusual reaction to one of the drugs. ° ° °[facility] °[place], [place] [address] °[phone] ° ° ° °AMBULATORY SURGERY  °DISCHARGE INSTRUCTIONS ° ° °1) The drugs that you were given will stay in your system until tomorrow so for the next 24 hours you should not: ° °A) Drive an automobile °B) Make any legal decisions °C) Drink any alcoholic beverage ° ° °2) You may resume regular meals tomorrow.  Today it is better to start with liquids and gradually work up to solid foods. ° °You may eat anything you prefer, but it is better to start with liquids, then soup and crackers, and gradually work up to solid foods. ° ° °3) Please notify your doctor immediately if you have any unusual bleeding, trouble breathing, redness and pain at the surgery site, drainage, fever, or pain not relieved by medication. ° ° ° °4) Additional Instructions: ° ° ° ° ° ° ° °  Please contact your physician with any problems or Same Day Surgery at [phone], Monday through Friday 6 am to 4 pm, or [place] number at [phone]. ° °

## 2019-12-22 ENCOUNTER — Encounter: Payer: Self-pay | Admitting: Urology

## 2019-12-23 LAB — SURGICAL PATHOLOGY

## 2019-12-30 ENCOUNTER — Ambulatory Visit (INDEPENDENT_AMBULATORY_CARE_PROVIDER_SITE_OTHER): Payer: Commercial Managed Care - PPO | Admitting: Urology

## 2019-12-30 ENCOUNTER — Encounter: Payer: Self-pay | Admitting: Urology

## 2019-12-30 ENCOUNTER — Other Ambulatory Visit: Payer: Commercial Managed Care - PPO | Admitting: Urology

## 2019-12-30 ENCOUNTER — Other Ambulatory Visit: Payer: Self-pay

## 2019-12-30 VITALS — BP 157/84 | HR 85 | Ht 64.0 in | Wt 148.0 lb

## 2019-12-30 DIAGNOSIS — C671 Malignant neoplasm of dome of bladder: Secondary | ICD-10-CM | POA: Diagnosis not present

## 2019-12-30 DIAGNOSIS — R300 Dysuria: Secondary | ICD-10-CM

## 2019-12-30 NOTE — Progress Notes (Signed)
12/30/2019 4:11 PM   Darrold Span 11/26/1960 [id number]  Referring provider: Danae Orleans, MD [place],  [place] [id number]  Chief Complaint  Patient presents with  . Results    pathology report    HPI: 59 y.o.F w/ hx of bladder cancer who returns today for cystoscopy surveillance cystoscopy after repeat induction of BCG completed in 09/2019.  She initially presented with microscopic hematuria.  She underwent CT urogram which was concerning for bladder cancer.  She was taken to the operating room in 11/2018 for TURBT with a large tumor at the dome of the bladder.  She received intravesical gemcitabine postoperatively.  3 cm tumor at the dome of the bladder was consistent with high-grade noninvasive urothelial carcinoma with CIS.  Muscularis propria was present and uninvolved.  She underwent induction course of BCG x6 which was well-tolerated.  She is found to have recurrence on follow-up cystoscopy.  She returned to the operating room on 06/2019 for multiple recurrent tumors.  Surgical pathology again was consistent with high-grade TA TCC.  Most recently, she completed a reinduction course of BCG x6.  This was completed in 09/2019.  She had a recurrence and return to the operating room on 12/21/2019.  This showed multiple tumors, 2 cm spherical tumor at the dome with a small sided lesion as well as a 1.5 cm tumor at the bladder neck possibly involving the right urethra.  Surgical pathology c/w with high-grade TA TCC and there is concern for possible laminal propria invasion and focally at the bladder neck tumor. pT1a.  Her last upper tract imaging cross sectional imaging was in 11/2018.  She does have urinary symptoms including urgency frequency and some dysuria.  UA today is pending.    PMH: Past Medical History:  Diagnosis Date  . Anxiety   . GERD (gastroesophageal reflux disease)   . Lower abdominal pain   . Rheumatoid  arthritis ([place])   . Urinary tract infection symptoms     Surgical History: Past Surgical History:  Procedure Laterality Date  . [place]  . CHOLECYSTECTOMY    . CYSTOSCOPY W/ RETROGRADES Bilateral 07/02/2019   Procedure: CYSTOSCOPY WITH RETROGRADE PYELOGRAM;  Surgeon: Hollice Espy, MD;  Location: [facility];  Service: Urology;  Laterality: Bilateral;  . CYSTOSCOPY W/ RETROGRADES Bilateral 12/21/2019   Procedure: CYSTOSCOPY WITH RETROGRADE PYELOGRAM;  Surgeon: Hollice Espy, MD;  Location: [facility];  Service: Urology;  Laterality: Bilateral;  . CYSTOSCOPY WITH BIOPSY N/A 12/22/2018   Procedure: CYSTOSCOPY WITH BIOPSY;  Surgeon: Hollice Espy, MD;  Location: [facility];  Service: Urology;  Laterality: N/A;  . TRANSURETHRAL RESECTION OF BLADDER TUMOR WITH MITOMYCIN-C N/A 12/22/2018   Procedure: TRANSURETHRAL RESECTION OF BLADDER TUMOR WITH Gemcitabine;  Surgeon: Hollice Espy, MD;  Location: [facility];  Service: Urology;  Laterality: N/A;  . TRANSURETHRAL RESECTION OF BLADDER TUMOR WITH MITOMYCIN-C N/A 07/02/2019   Procedure: TRANSURETHRAL RESECTION OF BLADDER TUMOR WITH Gemcitabine;  Surgeon: Hollice Espy, MD;  Location: [facility];  Service: Urology;  Laterality: N/A;  . TRANSURETHRAL RESECTION OF BLADDER TUMOR WITH MITOMYCIN-C N/A 12/21/2019   Procedure: TRANSURETHRAL RESECTION OF BLADDER TUMOR WITH Gemcitabine;  Surgeon: Hollice Espy, MD;  Location: [facility];  Service: Urology;  Laterality: N/A;    Home Medications:  Allergies as of 12/30/2019   No Known Allergies     Medication List       Accurate as of December 30, 2019  4:11 PM. If you have any  questions, ask your nurse or doctor.        acetaminophen 500 MG tablet Commonly known as: TYLENOL Take 1,000 mg by mouth every 6 (six) hours as needed (for pain.).   acidophilus Caps capsule Take 1 capsule by mouth daily.   hydroxychloroquine 200 MG tablet Commonly known as: PLAQUENIL Take 200 mg by mouth 2  (two) times daily.   ibuprofen 200 MG tablet Commonly known as: ADVIL Take 400 mg by mouth every 8 (eight) hours as needed (for pain).   omeprazole 20 MG capsule Commonly known as: PRILOSEC Take 20 mg by mouth daily as needed (heartburn/indigestion.).   PROBIOTIC PO Take 1 capsule by mouth daily.   sertraline 50 MG tablet Commonly known as: ZOLOFT Take 100 mg by mouth at bedtime.       Allergies: No Known Allergies  Family History: No family history on file.  Social History:  reports that she quit smoking about 6 years ago. She has never used smokeless tobacco. She reports current alcohol use. She reports that she does not use drugs.   Physical Exam: BP (!) 157/84   Pulse 85   Ht 5\' 4"  (1.626 m)   Wt 148 lb (67.1 kg)   BMI 25.40 kg/m   Constitutional:  Alert and oriented, No acute distress. HEENT: [place] AT, moist mucus membranes.  Trachea midline, no masses. Cardiovascular: No clubbing, cyanosis, or edema. Respiratory: Normal respiratory effort, no increased work of breathing. GI: Abdomen is soft, nontender, nondistended, no abdominal masses GU: No CVA tenderness Lymph: No cervical or inguinal lymphadenopathy. Skin: No rashes, bruises or suspicious lesions. Neurologic: Grossly intact, no focal deficits, moving all 4 extremities. Psychiatric: Normal mood and affect.  Laboratory Data: Lab Results  Component Value Date   WBC 6.9 05/13/2014   HGB 15.8 05/13/2014   HCT 46.3 05/13/2014   MCV 88 05/13/2014   PLT 207 05/13/2014    Lab Results  Component Value Date   CREATININE 0.90 12/04/2018    Urinalysis Results for orders placed or performed in visit on 12/30/19  CULTURE, URINE COMPREHENSIVE   Specimen: Urine   UR  Result Value Ref Range   Urine Culture, Comprehensive Final report (A)    Organism ID, Bacteria Klebsiella pneumoniae (A)    ANTIMICROBIAL SUSCEPTIBILITY Comment   Microscopic Examination   Urine  Result Value Ref Range   WBC, UA >30 (A) 0 -  5 /hpf   RBC >30 (A) 0 - 2 /hpf   Epithelial Cells (non renal) 0-10 0 - 10 /hpf   Renal Epithel, UA 0-10 (A) None seen /hpf   Bacteria, UA Many (A) None seen/Few  Urinalysis, Complete  Result Value Ref Range   Specific Gravity, UA >1.030 (H) 1.005 - 1.030   pH, UA 5.5 5.0 - 7.5   Color, UA Yellow Yellow   Appearance Ur Cloudy (A) Clear   Leukocytes,UA 1+ (A) Negative   Protein,UA 2+ (A) Negative/Trace   Glucose, UA Negative Negative   Ketones, UA Negative Negative   RBC, UA 2+ (A) Negative   Bilirubin, UA Negative Negative   Urobilinogen, Ur 0.2 0.2 - 1.0 mg/dL   Nitrite, UA Positive (A) Negative   Microscopic Examination See below:      Assessment & Plan:    1. Cancer of dome of urinary bladder ([place]) Lengthy discussion today about surgical pathology, recurrent probable pT1 HgTCC  Overall, the nature of her very frequent recurrences both in volume as well as concerning pathology despite 2 induction  course of BCG is very concerning.  Based on his most recent pathology, I recommended returning to the operating room for repeat TUR at around 3 weeks postop to ensure that there is no upstaging.  Would recommend consideration of repeat upper tract cross-sectional imaging.  We did frank and honest discussion today regarding my concern for the aggressiveness of her disease.  She is relatively young and healthy and bladder cancer will likely continue to become a recurrent and possibly progressive issue for her.  I have urged her to consider cystectomy as one option.  We briefly discussed the procedure as well as diversion techniques, based on the proximity to her urethra and possible bladder neck invasion, she may not be a candidate for neobladder.  I have urged her to follow-up with my colleague, Dr. Phebe Colla in [place] to continue this conversation.  If she does not like to pursue this, given that she has failed to induction course of BCG with very prompt recurrences, will plan to  try an induction course of intravesical gemcitabine x6 weeks.  We discussed this will be done over at the cancer center.  We discussed possible side effects including irritative voiding symptoms, infection need for Foley catheter for the procedure, amongst others.   We will plan for repeat cystoscopy in no less than 3 months after her last TUR.  All questions answered. - Urinalysis, Complete - CULTURE, URINE COMPREHENSIVE - Ambulatory referral to Urology  2. Dysuria UA somewhat suspicious for infection although may be postop  We will wait for urine culture to guide treatment    Hollice Espy, MD  [employer] [address], [place], [place] [address] [phone]

## 2019-12-31 ENCOUNTER — Telehealth: Payer: Self-pay

## 2019-12-31 ENCOUNTER — Other Ambulatory Visit: Payer: Self-pay | Admitting: Radiology

## 2019-12-31 DIAGNOSIS — C671 Malignant neoplasm of dome of bladder: Secondary | ICD-10-CM

## 2019-12-31 MED ORDER — GEMCITABINE CHEMO FOR BLADDER INSTILLATION 2000 MG: 2000.0000 mg | Freq: Once | INTRAVENOUS | Status: DC

## 2019-12-31 NOTE — Telephone Encounter (Signed)
Patient was notified that urine was sent for culture and we would call with culture results. Patient verbalized understanding

## 2020-01-01 ENCOUNTER — Telehealth: Payer: Self-pay | Admitting: *Deleted

## 2020-01-01 LAB — URINALYSIS, COMPLETE
Bilirubin, UA: NEGATIVE
Glucose, UA: NEGATIVE
Ketones, UA: NEGATIVE
Nitrite, UA: POSITIVE — AB
Specific Gravity, UA: >1.03 — ABNORMAL HIGH (ref 1.005–1.030)
Urobilinogen, Ur: 0.2 mg/dL (ref 0.2–1.0)
pH, UA: 5.5 (ref 5.0–7.5)

## 2020-01-01 LAB — MICROSCOPIC EXAMINATION
RBC, Urine: >30 /hpf — AB (ref 0–2)
WBC, UA: >30 /hpf — AB (ref 0–5)

## 2020-01-01 MED ORDER — SULFAMETHOXAZOLE-TRIMETHOPRIM 800-160 MG PO TABS: 1.0000 | ORAL_TABLET | Freq: Two times a day (BID) | ORAL | 0 refills | Status: DC

## 2020-01-01 NOTE — Telephone Encounter (Addendum)
Patient aware, Bactrim sent to [facility] as requested. Voiced understanding.   ----- Message from Hollice Espy, MD sent at 01/01/2020  2:12 PM EST ----- Please go ahead and treat with Bactrim DS b.i.d. for seven days unless she's allergic. Pre-limb is growing gram-negative rods but I don't wanna go through the weekend without being treated.

## 2020-01-03 LAB — CULTURE, URINE COMPREHENSIVE

## 2020-01-06 ENCOUNTER — Other Ambulatory Visit: Payer: Commercial Managed Care - PPO

## 2020-01-07 ENCOUNTER — Other Ambulatory Visit
Admission: RE | Admit: 2020-01-07 | Discharge: 2020-01-07 | Disposition: A | Discharge status: Home / Self Care | Payer: Commercial Managed Care - PPO | Source: Ambulatory Visit | Attending: Urology | Admitting: Urology

## 2020-01-07 DIAGNOSIS — Z20822 Contact with and (suspected) exposure to covid-19: Secondary | ICD-10-CM | POA: Diagnosis not present

## 2020-01-07 DIAGNOSIS — Z01812 Encounter for preprocedural laboratory examination: Secondary | ICD-10-CM | POA: Insufficient documentation

## 2020-01-07 LAB — SARS CORONAVIRUS 2 (TAT 6-24 HRS): SARS Coronavirus 2: NEGATIVE

## 2020-01-08 ENCOUNTER — Encounter: Payer: Self-pay | Admitting: Urgent Care

## 2020-01-10 ENCOUNTER — Encounter: Payer: Self-pay | Admitting: Emergency Medicine

## 2020-01-10 ENCOUNTER — Emergency Department: Payer: Commercial Managed Care - PPO

## 2020-01-10 ENCOUNTER — Other Ambulatory Visit: Payer: Self-pay

## 2020-01-10 ENCOUNTER — Emergency Department
Admission: EM | Admit: 2020-01-10 | Discharge: 2020-01-11 | Disposition: A | Discharge status: Home / Self Care | Payer: Commercial Managed Care - PPO | Attending: Emergency Medicine | Admitting: Emergency Medicine

## 2020-01-10 DIAGNOSIS — Z87891 Personal history of nicotine dependence: Secondary | ICD-10-CM | POA: Diagnosis not present

## 2020-01-10 DIAGNOSIS — R109 Unspecified abdominal pain: Secondary | ICD-10-CM | POA: Diagnosis present

## 2020-01-10 DIAGNOSIS — K529 Noninfective gastroenteritis and colitis, unspecified: Secondary | ICD-10-CM | POA: Insufficient documentation

## 2020-01-10 DIAGNOSIS — Z20822 Contact with and (suspected) exposure to covid-19: Secondary | ICD-10-CM | POA: Diagnosis not present

## 2020-01-10 DIAGNOSIS — R000 Tachycardia, unspecified: Secondary | ICD-10-CM | POA: Insufficient documentation

## 2020-01-10 DIAGNOSIS — Z79899 Other long term (current) drug therapy: Secondary | ICD-10-CM | POA: Insufficient documentation

## 2020-01-10 DIAGNOSIS — A0811 Acute gastroenteropathy due to Norwalk agent: Secondary | ICD-10-CM | POA: Insufficient documentation

## 2020-01-10 DIAGNOSIS — E860 Dehydration: Secondary | ICD-10-CM | POA: Diagnosis not present

## 2020-01-10 DIAGNOSIS — Z8551 Personal history of malignant neoplasm of bladder: Secondary | ICD-10-CM | POA: Diagnosis not present

## 2020-01-10 HISTORY — DX: Malignant (primary) neoplasm, unspecified: C80.1

## 2020-01-10 LAB — URINALYSIS, COMPLETE (UACMP) WITH MICROSCOPIC
Bacteria, UA: NONE SEEN
Bilirubin Urine: NEGATIVE
Glucose, UA: NEGATIVE mg/dL
Ketones, ur: NEGATIVE mg/dL
Nitrite: NEGATIVE
Protein, ur: NEGATIVE mg/dL
Specific Gravity, Urine: >1.046 — ABNORMAL HIGH (ref 1.005–1.030)
pH: 5 (ref 5.0–8.0)

## 2020-01-10 LAB — COMPREHENSIVE METABOLIC PANEL
ALT: 24 U/L (ref 0–44)
AST: 27 U/L (ref 15–41)
Albumin: 4.6 g/dL (ref 3.5–5.0)
Alkaline Phosphatase: 92 U/L (ref 38–126)
Anion gap: 14 (ref 5–15)
BUN: 17 mg/dL (ref 6–20)
CO2: 23 mmol/L (ref 22–32)
Calcium: 9.7 mg/dL (ref 8.9–10.3)
Chloride: 101 mmol/L (ref 98–111)
Creatinine, Ser: 1.02 mg/dL — ABNORMAL HIGH (ref 0.44–1.00)
GFR, Estimated: >60 mL/min (ref >60)
Glucose, Bld: 255 mg/dL — ABNORMAL HIGH (ref 70–99)
Potassium: 3.9 mmol/L (ref 3.5–5.1)
Sodium: 138 mmol/L (ref 135–145)
Total Bilirubin: 0.7 mg/dL (ref 0.3–1.2)
Total Protein: 7.6 g/dL (ref 6.5–8.1)

## 2020-01-10 LAB — RESP PANEL BY RT-PCR (FLU A&B, COVID) ARPGX2
Influenza A by PCR: NEGATIVE
Influenza B by PCR: NEGATIVE
SARS Coronavirus 2 by RT PCR: NEGATIVE

## 2020-01-10 LAB — CBC
HCT: 45.3 % (ref 36.0–46.0)
Hemoglobin: 15.2 g/dL — ABNORMAL HIGH (ref 12.0–15.0)
MCH: 29.4 pg (ref 26.0–34.0)
MCHC: 33.6 g/dL (ref 30.0–36.0)
MCV: 87.6 fL (ref 80.0–100.0)
Platelets: 353 10*3/uL (ref 150–400)
RBC: 5.17 MIL/uL — ABNORMAL HIGH (ref 3.87–5.11)
RDW: 14.6 % (ref 11.5–15.5)
WBC: 12.3 10*3/uL — ABNORMAL HIGH (ref 4.0–10.5)
nRBC: 0 % (ref 0.0–0.2)

## 2020-01-10 LAB — GASTROINTESTINAL PANEL BY PCR, STOOL (REPLACES STOOL CULTURE)

## 2020-01-10 LAB — MAGNESIUM: Magnesium: 1.9 mg/dL (ref 1.7–2.4)

## 2020-01-10 LAB — LACTIC ACID, PLASMA
Lactic Acid, Venous: 2.3 mmol/L (ref 0.5–1.9)
Lactic Acid, Venous: 2.1 mmol/L (ref 0.5–1.9)

## 2020-01-10 LAB — C DIFFICILE QUICK SCREEN W PCR REFLEX
C Diff antigen: NEGATIVE
C Diff interpretation: NOT DETECTED
C Diff toxin: NEGATIVE

## 2020-01-10 LAB — LIPASE, BLOOD: Lipase: 27 U/L (ref 11–51)

## 2020-01-10 MED ORDER — LACTATED RINGERS IV BOLUS
1000.0000 mL | Freq: Once | INTRAVENOUS | Status: AC
  Administered 2020-01-10: 1000 mL via INTRAVENOUS

## 2020-01-10 MED ORDER — IOHEXOL 300 MG/ML  SOLN
100.0000 mL | Freq: Once | INTRAMUSCULAR | Status: AC | PRN
  Administered 2020-01-10: 100 mL via INTRAVENOUS

## 2020-01-10 MED ORDER — ONDANSETRON 4 MG PO TBDP
4.0000 mg | ORAL_TABLET | Freq: Once | ORAL | Status: AC | PRN
  Administered 2020-01-10: 4 mg via ORAL
  Filled 2020-01-10: qty 1

## 2020-01-10 NOTE — ED Provider Notes (Signed)
[facility] Emergency Department Provider Note  ____________________________________________   Event Date/Time   First MD Initiated Contact with Patient 01/10/20 2101     (approximate)  I have reviewed the triage vital signs and the nursing notes.   HISTORY  Chief Complaint Abdominal Pain and Diarrhea   HPI Sarah Rush is a 59 y.o. female with a past medical history of GERD, anxiety, RA currently on Plaquenil and bladder cancer S/P BCG x6 with recurrence of bladder tumors on cystoscopy who presents for assessment of sudden onset of nonbloody nonbilious nausea, vomiting, diarrhea and crampy abdominal pain that began around 1 PM today.  Patient states she had some sausage and gravy earlier today but there are no other sick people she has been around that she is aware.  She denies any fevers, chills, headache, earache, cough, shortness of breath, chest pain, back pain, urinary symptoms, rash or extremity pain.  Denies any recent falls or injuries.  No clearly pitting aggravating factors.  No prior similar episodes.         Past Medical History:  Diagnosis Date  . Anxiety   . Cancer ([place])    bladder  . GERD (gastroesophageal reflux disease)   . Lower abdominal pain   . Rheumatoid arthritis ([place])   . Urinary tract infection symptoms     Patient Active Problem List   Diagnosis Date Noted  . Rheumatoid arthritis, seropositive ([place]) 11/11/2015  . Encounter for long-term (current) use of high-risk medication 11/11/2015    Past Surgical History:  Procedure Laterality Date  . [place]  . CHOLECYSTECTOMY    . CYSTOSCOPY W/ RETROGRADES Bilateral 07/02/2019   Procedure: CYSTOSCOPY WITH RETROGRADE PYELOGRAM;  Surgeon: Hollice Espy, MD;  Location: [facility] ORS;  Service: Urology;  Laterality: Bilateral;  . CYSTOSCOPY W/ RETROGRADES Bilateral 12/21/2019   Procedure: CYSTOSCOPY WITH RETROGRADE PYELOGRAM;  Surgeon: Hollice Espy, MD;   Location: [facility] ORS;  Service: Urology;  Laterality: Bilateral;  . CYSTOSCOPY WITH BIOPSY N/A 12/22/2018   Procedure: CYSTOSCOPY WITH BIOPSY;  Surgeon: Hollice Espy, MD;  Location: [facility] ORS;  Service: Urology;  Laterality: N/A;  . TRANSURETHRAL RESECTION OF BLADDER TUMOR WITH MITOMYCIN-C N/A 12/22/2018   Procedure: TRANSURETHRAL RESECTION OF BLADDER TUMOR WITH Gemcitabine;  Surgeon: Hollice Espy, MD;  Location: [facility] ORS;  Service: Urology;  Laterality: N/A;  . TRANSURETHRAL RESECTION OF BLADDER TUMOR WITH MITOMYCIN-C N/A 07/02/2019   Procedure: TRANSURETHRAL RESECTION OF BLADDER TUMOR WITH Gemcitabine;  Surgeon: Hollice Espy, MD;  Location: [facility] ORS;  Service: Urology;  Laterality: N/A;  . TRANSURETHRAL RESECTION OF BLADDER TUMOR WITH MITOMYCIN-C N/A 12/21/2019   Procedure: TRANSURETHRAL RESECTION OF BLADDER TUMOR WITH Gemcitabine;  Surgeon: Hollice Espy, MD;  Location: [facility] ORS;  Service: Urology;  Laterality: N/A;    Prior to Admission medications   Medication Sig Start Date End Date Taking? Authorizing Provider  acetaminophen (TYLENOL) 500 MG tablet Take 1,000 mg by mouth every 6 (six) hours as needed (for pain.).    [provider]  acidophilus (RISAQUAD) CAPS capsule Take 1 capsule by mouth daily.    [provider]  hydroxychloroquine (PLAQUENIL) 200 MG tablet Take 200 mg by mouth 2 (two) times daily. 05/08/19   [provider]  ibuprofen (ADVIL) 200 MG tablet Take 400 mg by mouth every 8 (eight) hours as needed (for pain).    [provider]  omeprazole (PRILOSEC) 20 MG capsule Take 20 mg by mouth daily as needed (heartburn/indigestion.).    [provider]  Probiotic Product (PROBIOTIC PO) Take 1 capsule by mouth daily.    [provider]  sertraline (ZOLOFT) 50 MG tablet Take 100 mg by mouth at bedtime.  05/23/19   [provider]  sulfamethoxazole-trimethoprim (BACTRIM DS) 800-160 MG tablet Take 1 tablet by mouth 2  (two) times daily. 01/01/20   Hollice Espy, MD    Allergies Patient has no known allergies.  No family history on file.  Social History Social History   Tobacco Use  . Smoking status: Former Smoker    Quit date: 12/14/2013    Years since quitting: 6.0  . Smokeless tobacco: Never Used  Vaping Use  . Vaping Use: Never used  Substance Use Topics  . Alcohol use: Yes    Comment: occassional beer  . Drug use: Never    Review of Systems  Review of Systems  Constitutional: Negative for chills and fever.  HENT: Negative for sore throat.   Eyes: Negative for pain.  Respiratory: Negative for cough and stridor.   Cardiovascular: Negative for chest pain.  Gastrointestinal: Positive for abdominal pain, diarrhea, nausea and vomiting.  Skin: Negative for rash.  Neurological: Negative for seizures, loss of consciousness and headaches.  Psychiatric/Behavioral: Negative for suicidal ideas.  All other systems reviewed and are negative.     ____________________________________________   PHYSICAL EXAM:  VITAL SIGNS: ED Triage Vitals [01/10/20 1928]  Enc Vitals Group     BP 132/74     Pulse Rate (!) 130     Resp 20     Temp 98.1 F (36.7 C)     Temp Source Oral     SpO2 97 %     Weight 148 lb (67.1 kg)     Height 5\' 4"  (1.626 m)     Head Circumference      Peak Flow      Pain Score 8     Pain Loc      Pain Edu?      Excl. in [place]?    Vitals:   01/10/20 1928 01/10/20 2130  BP: 132/74 135/77  Pulse: (!) 130 (!) 111  Resp: 20 12  Temp: 98.1 F (36.7 C)   SpO2: 97% 98%   Physical Exam Vitals and nursing note reviewed.  Constitutional:      General: She is not in acute distress.    Appearance: She is well-developed and well-nourished.  HENT:     Head: Normocephalic and atraumatic.     Right Ear: External ear normal.     Left Ear: External ear normal.     Nose: Nose normal.     Mouth/Throat:     Mouth: Mucous membranes are dry.  Eyes:     Conjunctiva/sclera:  Conjunctivae normal.  Cardiovascular:     Rate and Rhythm: Regular rhythm. Tachycardia present.     Heart sounds: No murmur heard.   Pulmonary:     Effort: Pulmonary effort is normal. No respiratory distress.     Breath sounds: Normal breath sounds.  Abdominal:     Palpations: Abdomen is soft.     Tenderness: There is no abdominal tenderness. There is no right CVA tenderness or left CVA tenderness.  Musculoskeletal:        General: No edema.     Cervical back: Neck supple.  Skin:    General: Skin is warm and dry.     Capillary Refill: Capillary refill takes more than 3 seconds.  Neurological:     Mental Status:  She is alert and oriented to person, place, and time.  Psychiatric:        Mood and Affect: Mood and affect and mood normal.      ____________________________________________   LABS (all labs ordered are listed, but only abnormal results are displayed)  Labs Reviewed  GASTROINTESTINAL PANEL BY PCR, STOOL (REPLACES STOOL CULTURE) - Abnormal; Notable for the following components:      Result Value   Norovirus GI/GII DETECTED (*)    All other components within normal limits  COMPREHENSIVE METABOLIC PANEL - Abnormal; Notable for the following components:   Glucose, Bld 255 (*)    Creatinine, Ser 1.02 (*)    All other components within normal limits  CBC - Abnormal; Notable for the following components:   WBC 12.3 (*)    RBC 5.17 (*)    Hemoglobin 15.2 (*)    All other components within normal limits  URINALYSIS, COMPLETE (UACMP) WITH MICROSCOPIC - Abnormal; Notable for the following components:   Color, Urine YELLOW (*)    APPearance HAZY (*)    Specific Gravity, Urine >1.046 (*)    Hgb urine dipstick SMALL (*)    Leukocytes,Ua TRACE (*)    All other components within normal limits  LACTIC ACID, PLASMA - Abnormal; Notable for the following components:   Lactic Acid, Venous 2.3 (*)    All other components within normal limits  C DIFFICILE QUICK SCREEN W PCR  REFLEX  RESP PANEL BY RT-PCR (FLU A&B, COVID) ARPGX2  CULTURE, BLOOD (ROUTINE X 2)  CULTURE, BLOOD (ROUTINE X 2)  URINE CULTURE  LIPASE, BLOOD  MAGNESIUM  LACTIC ACID, PLASMA  HEMOGLOBIN A1C   ____________________________________________  ____________________________________________  RADIOLOGY  ED MD interpretation: No evidence of pyelonephritis, diverticulitis, sinusitis acute cholecystitis, pancreatitis or other acute intra-abdominal pathology.  Official radiology report(s): CT ABDOMEN PELVIS W CONTRAST  Result Date: 01/10/2020 CLINICAL DATA:  Abdominal pain, vomiting EXAM: CT ABDOMEN AND PELVIS WITH CONTRAST TECHNIQUE: Multidetector CT imaging of the abdomen and pelvis was performed using the standard protocol following bolus administration of intravenous contrast. CONTRAST:  169mL OMNIPAQUE IOHEXOL 300 MG/ML  SOLN COMPARISON:  12/04/2018 FINDINGS: Lower chest: Lung bases are clear. No effusions. Heart is normal size. Hepatobiliary: Scattered hypodensities throughout the liver compatible with cysts. Prior cholecystectomy. Pancreas: No focal abnormality or ductal dilatation. Spleen: No focal abnormality.  Normal size. Adrenals/Urinary Tract: No adrenal abnormality. No focal renal abnormality. No stones or hydronephrosis. Urinary bladder is unremarkable. Stomach/Bowel: Stomach, large and small bowel grossly unremarkable. Normal appendix. Vascular/Lymphatic: Aortic atherosclerosis. No evidence of aneurysm or adenopathy. Reproductive: Uterus and adnexa unremarkable.  No mass. Other: No free fluid or free air. Musculoskeletal: No acute bony abnormality. IMPRESSION: No acute findings in the abdomen or pelvis. Aortic atherosclerosis. Electronically Signed   By: Rolm Baptise M.D.   On: 01/10/2020 21:58    ____________________________________________   PROCEDURES  Procedure(s) performed (including Critical Care):  .1-3 Lead EKG Interpretation Performed by: Lucrezia Starch,  MD Authorized by: Lucrezia Starch, MD     Interpretation: normal     ECG rate assessment: tachycardic     Rhythm: sinus tachycardia     Ectopy: none       ____________________________________________   INITIAL IMPRESSION / ASSESSMENT AND PLAN / ED COURSE       Patient presents with [place] to history exam for assessment of sudden onset of abdominal pain associate with nonbloody nonbilious vomiting and nonbloody diarrhea.  On arrival patient is tachycardic and  borderline tachypneic with otherwise stable vital signs on room air.  Her abdomen is soft nontender throughout and she has no CVA tenderness but does appear dehydrated with decreased cap refill and dry mucous membranes.  Differential) not limited to acute infectious gastroenteritis, diverticulitis, significant metabolic derangement, appendicitis, cystitis, pyelonephritis, and pancreatitis.  Patient is status post cholecystectomy have low suspicion for cholecystitis at this time.  CT obtained shows no evidence of appendicitis, diverticulitis, or other acute intra-abdominal process including evidence of pyelonephritis or other infectious foci. foci.  Lipase of 27 is not consistent with pancreatitis.  CMP is a glucose of 255 with no other significant electrolyte metabolic derangements.  No evidence of acidosis suggestive of DKA.  Patient is not a history of diabetes and unclear if this is related to stress response to acute infectious process versus undiagnosed diabetes.  A1c sent.  CBC with leukocytosis with WBC count of 12.3 otherwise unremarkable.  C. difficile is negative.  GI pathogen panel is positive for neurovirus.  Initial lactic acid is slightly elevated 2.3 which I suspect is more accurate due to dehydration and the low suspicion for bacteremia at this time.  Patient given Zofran and 2 L of IV fluids.  Her heart rate did improve to 111.  Care of patient signed over to oncoming provider Dr. Brenton Grills at approximately 2300.  Plan  is to follow-up UA and Covid as well as reassess patient.  If patient's heart rate normalizes and she is tolerating p.o. she will likely be safe for discharge with plan for close outpatient follow-up.  ____________________________________________   FINAL CLINICAL IMPRESSION(S) / ED DIAGNOSES  Final diagnoses:  Norovirus  Gastroenteritis  Dehydration    Medications  lactated ringers bolus 1,000 mL (has no administration in time range)  ondansetron (ZOFRAN-ODT) disintegrating tablet 4 mg (4 mg Oral Given 01/10/20 1932)  lactated ringers bolus 1,000 mL (1,000 mLs Intravenous New Bag/Given 01/10/20 2126)  iohexol (OMNIPAQUE) 300 MG/ML solution 100 mL (100 mLs Intravenous Contrast Given 01/10/20 2145)     ED Discharge Orders    None       Note:  This document was prepared using Dragon voice recognition software and may include unintentional dictation errors.   Lucrezia Starch, MD 01/10/20 [phone]

## 2020-01-10 NOTE — ED Triage Notes (Signed)
Patient states that about 13:00 today she started vomiting, diarrhea and generalized abdominal pain.

## 2020-01-10 NOTE — ED Notes (Signed)
Lab called with result of Noravirus detected in GI Panel. MD notified.

## 2020-01-11 ENCOUNTER — Ambulatory Visit: Admission: RE | Admit: 2020-01-11 | Payer: Commercial Managed Care - PPO | Source: Home / Self Care | Admitting: Urology

## 2020-01-11 ENCOUNTER — Other Ambulatory Visit: Payer: Self-pay | Admitting: Radiology

## 2020-01-11 LAB — HEMOGLOBIN A1C
Hgb A1c MFr Bld: 5.2 % (ref 4.8–5.6)
Mean Plasma Glucose: 102.54 mg/dL

## 2020-01-11 SURGERY — TRANSURETHRAL RESECTION OF BLADDER TUMOR WITH MITOMYCIN-C
Anesthesia: General

## 2020-01-11 MED ORDER — DICYCLOMINE HCL 10 MG PO CAPS: 10.0000 mg | ORAL_CAPSULE | Freq: Four times a day (QID) | ORAL | 0 refills | Status: DC | PRN

## 2020-01-11 MED ORDER — DICYCLOMINE HCL 10 MG PO CAPS
10.0000 mg | ORAL_CAPSULE | ORAL | Status: AC
  Administered 2020-01-11: 10 mg via ORAL
  Filled 2020-01-11: qty 1

## 2020-01-11 MED ORDER — ONDANSETRON 4 MG PO TBDP
8.0000 mg | ORAL_TABLET | Freq: Once | ORAL | Status: AC
  Administered 2020-01-11: 8 mg via ORAL
  Filled 2020-01-11: qty 2

## 2020-01-11 MED ORDER — ONDANSETRON 4 MG PO TBDP: 4.0000 mg | ORAL_TABLET | Freq: Three times a day (TID) | ORAL | 0 refills | Status: DC | PRN

## 2020-01-11 NOTE — ED Provider Notes (Signed)
Procedures     ----------------------------------------- 2:24 AM on 01/11/2020 -----------------------------------------  Patient feels better.  Heart rate improved to 100.  Tolerating p.o.  Appears comfortable.  Labs reveal diagnosis of norovirus gastroenteritis.  Counseled patient on hydration strategies, return precautions, symptom management with medications.  She is agreeable with discharge and home management.    Carrie Mew, MD 01/11/20 Sarah Rush

## 2020-01-12 LAB — URINE CULTURE: Culture: <10000 — AB

## 2020-01-18 ENCOUNTER — Other Ambulatory Visit: Payer: Self-pay | Admitting: Family Medicine

## 2020-01-18 DIAGNOSIS — C671 Malignant neoplasm of dome of bladder: Secondary | ICD-10-CM

## 2020-01-20 LAB — CULTURE, BLOOD (ROUTINE X 2)
Culture: NO GROWTH
Culture: NO GROWTH
Special Requests: ADEQUATE
Special Requests: ADEQUATE

## 2020-01-25 ENCOUNTER — Other Ambulatory Visit: Payer: Commercial Managed Care - PPO

## 2020-01-25 ENCOUNTER — Other Ambulatory Visit: Payer: Self-pay

## 2020-01-25 DIAGNOSIS — C671 Malignant neoplasm of dome of bladder: Secondary | ICD-10-CM

## 2020-01-26 LAB — URINALYSIS, COMPLETE
Bilirubin, UA: NEGATIVE
Glucose, UA: NEGATIVE
Ketones, UA: NEGATIVE
Nitrite, UA: NEGATIVE
Protein,UA: NEGATIVE
Specific Gravity, UA: >1.03 — ABNORMAL HIGH (ref 1.005–1.030)
Urobilinogen, Ur: 0.2 mg/dL (ref 0.2–1.0)
pH, UA: 5 (ref 5.0–7.5)

## 2020-01-26 LAB — MICROSCOPIC EXAMINATION: WBC, UA: >30 /hpf — AB (ref 0–5)

## 2020-01-27 ENCOUNTER — Telehealth: Payer: Self-pay | Admitting: *Deleted

## 2020-01-27 MED ORDER — CIPROFLOXACIN HCL 250 MG PO TABS
250.0000 mg | ORAL_TABLET | Freq: Two times a day (BID) | ORAL | 0 refills | Status: DC
Start: 2020-01-27 — End: 2020-02-09

## 2020-01-27 NOTE — Telephone Encounter (Signed)
-----   Message from Carman Ching, [place] sent at 01/27/2020  4:57 PM EST ----- Please start her on empiric Bactrim DS twice daily x5 days. ----- Message ----- From: Sarita Bottom, CMA Sent: 01/27/2020   4:56 PM EST To: Carman Ching, PA-C

## 2020-01-27 NOTE — Telephone Encounter (Signed)
Per Marlena Clipper will send in Cipro for 5 days versus Bactrim due to recent urine culture. Patient advised, voiced understanding. RX sent in.

## 2020-01-29 ENCOUNTER — Other Ambulatory Visit: Payer: Self-pay

## 2020-01-29 ENCOUNTER — Other Ambulatory Visit
Admission: RE | Admit: 2020-01-29 | Discharge: 2020-01-29 | Disposition: A | Discharge status: Home / Self Care | Payer: Commercial Managed Care - PPO | Source: Ambulatory Visit | Attending: Urology | Admitting: Urology

## 2020-01-29 DIAGNOSIS — Z01812 Encounter for preprocedural laboratory examination: Secondary | ICD-10-CM | POA: Diagnosis present

## 2020-01-29 DIAGNOSIS — Z20822 Contact with and (suspected) exposure to covid-19: Secondary | ICD-10-CM | POA: Insufficient documentation

## 2020-01-29 LAB — SARS CORONAVIRUS 2 (TAT 6-24 HRS): SARS Coronavirus 2: NEGATIVE

## 2020-01-30 LAB — CULTURE, URINE COMPREHENSIVE

## 2020-02-01 ENCOUNTER — Encounter
Admission: RE | Disposition: A | Discharge status: Home / Self Care | Payer: Self-pay | Source: Home / Self Care | Attending: Urology

## 2020-02-01 ENCOUNTER — Other Ambulatory Visit: Payer: Self-pay | Admitting: Radiology

## 2020-02-01 ENCOUNTER — Other Ambulatory Visit: Payer: Self-pay

## 2020-02-01 ENCOUNTER — Ambulatory Visit
Admission: RE | Admit: 2020-02-01 | Discharge: 2020-02-01 | Disposition: A | Discharge status: Home / Self Care | Payer: Commercial Managed Care - PPO | Attending: Urology | Admitting: Urology

## 2020-02-01 ENCOUNTER — Encounter: Payer: Self-pay | Admitting: Urology

## 2020-02-01 ENCOUNTER — Ambulatory Visit: Payer: Commercial Managed Care - PPO | Admitting: Anesthesiology

## 2020-02-01 DIAGNOSIS — R3915 Urgency of urination: Secondary | ICD-10-CM | POA: Insufficient documentation

## 2020-02-01 DIAGNOSIS — Z8551 Personal history of malignant neoplasm of bladder: Secondary | ICD-10-CM | POA: Diagnosis not present

## 2020-02-01 DIAGNOSIS — N3080 Other cystitis without hematuria: Secondary | ICD-10-CM | POA: Insufficient documentation

## 2020-02-01 DIAGNOSIS — C671 Malignant neoplasm of dome of bladder: Secondary | ICD-10-CM

## 2020-02-01 DIAGNOSIS — Z87891 Personal history of nicotine dependence: Secondary | ICD-10-CM | POA: Insufficient documentation

## 2020-02-01 DIAGNOSIS — R350 Frequency of micturition: Secondary | ICD-10-CM | POA: Insufficient documentation

## 2020-02-01 DIAGNOSIS — Z79899 Other long term (current) drug therapy: Secondary | ICD-10-CM | POA: Diagnosis not present

## 2020-02-01 DIAGNOSIS — N3289 Other specified disorders of bladder: Secondary | ICD-10-CM | POA: Insufficient documentation

## 2020-02-01 DIAGNOSIS — R300 Dysuria: Secondary | ICD-10-CM | POA: Diagnosis not present

## 2020-02-01 HISTORY — PX: TRANSURETHRAL RESECTION OF BLADDER TUMOR WITH MITOMYCIN-C: SHX6459

## 2020-02-01 SURGERY — TRANSURETHRAL RESECTION OF BLADDER TUMOR WITH MITOMYCIN-C
Anesthesia: General

## 2020-02-01 MED ORDER — PROPOFOL 10 MG/ML IV BOLUS
INTRAVENOUS | Status: DC | PRN
  Administered 2020-02-01: 200 mg via INTRAVENOUS

## 2020-02-01 MED ORDER — CEFAZOLIN SODIUM-DEXTROSE 2-4 GM/100ML-% IV SOLN
2.0000 g | INTRAVENOUS | Status: AC
  Administered 2020-02-01: 2 g via INTRAVENOUS

## 2020-02-01 MED ORDER — CHLORHEXIDINE GLUCONATE 0.12 % MT SOLN
OROMUCOSAL | Status: AC
  Filled 2020-02-01: qty 15

## 2020-02-01 MED ORDER — CLINDAMYCIN PHOSPHATE 600 MG/50ML IV SOLN
INTRAVENOUS | Status: AC
  Filled 2020-02-01: qty 50

## 2020-02-01 MED ORDER — MIDAZOLAM HCL 2 MG/2ML IJ SOLN
INTRAMUSCULAR | Status: AC
  Filled 2020-02-01: qty 2

## 2020-02-01 MED ORDER — DEXAMETHASONE SODIUM PHOSPHATE 10 MG/ML IJ SOLN
INTRAMUSCULAR | Status: DC | PRN
  Administered 2020-02-01: 5 mg via INTRAVENOUS

## 2020-02-01 MED ORDER — PROPOFOL 10 MG/ML IV BOLUS
INTRAVENOUS | Status: AC
  Filled 2020-02-01: qty 20

## 2020-02-01 MED ORDER — CEFAZOLIN SODIUM-DEXTROSE 2-4 GM/100ML-% IV SOLN
INTRAVENOUS | Status: AC
  Filled 2020-02-01: qty 100

## 2020-02-01 MED ORDER — MIDAZOLAM HCL 2 MG/2ML IJ SOLN
INTRAMUSCULAR | Status: DC | PRN
  Administered 2020-02-01: 2 mg via INTRAVENOUS

## 2020-02-01 MED ORDER — OXYCODONE HCL 5 MG PO TABS: 5.0000 mg | ORAL_TABLET | Freq: Once | ORAL | Status: DC | PRN

## 2020-02-01 MED ORDER — GEMCITABINE CHEMO FOR BLADDER INSTILLATION 2000 MG: 2000.0000 mg | Freq: Once | INTRAVENOUS | Status: DC

## 2020-02-01 MED ORDER — OXYCODONE HCL 5 MG/5ML PO SOLN: 5.0000 mg | Freq: Once | ORAL | Status: DC | PRN

## 2020-02-01 MED ORDER — CHLORHEXIDINE GLUCONATE 0.12 % MT SOLN
15.0000 mL | Freq: Once | OROMUCOSAL | Status: AC
  Administered 2020-02-01: 15 mL via OROMUCOSAL

## 2020-02-01 MED ORDER — ONDANSETRON HCL 4 MG/2ML IJ SOLN
INTRAMUSCULAR | Status: DC | PRN
  Administered 2020-02-01: 4 mg via INTRAVENOUS

## 2020-02-01 MED ORDER — LACTATED RINGERS IV SOLN: INTRAVENOUS | Status: DC

## 2020-02-01 MED ORDER — GEMCITABINE CHEMO FOR BLADDER INSTILLATION 2000 MG
2000.0000 mg | Freq: Once | INTRAVENOUS | Status: AC
  Administered 2020-02-01: 2000 mg via INTRAVESICAL
  Filled 2020-02-01: qty 52.6

## 2020-02-01 MED ORDER — FENTANYL CITRATE (PF) 100 MCG/2ML IJ SOLN
INTRAMUSCULAR | Status: DC | PRN
  Administered 2020-02-01: 50 ug via INTRAVENOUS
  Administered 2020-02-01 (×2): 25 ug via INTRAVENOUS

## 2020-02-01 MED ORDER — CLINDAMYCIN PHOSPHATE 600 MG/50ML IV SOLN
600.0000 mg | INTRAVENOUS | Status: AC
  Administered 2020-02-01: 600 mg via INTRAVENOUS

## 2020-02-01 MED ORDER — LIDOCAINE HCL (PF) 2 % IJ SOLN
INTRAMUSCULAR | Status: AC
  Filled 2020-02-01: qty 5

## 2020-02-01 MED ORDER — FENTANYL CITRATE (PF) 100 MCG/2ML IJ SOLN: 25.0000 ug | INTRAMUSCULAR | Status: DC | PRN

## 2020-02-01 MED ORDER — DEXAMETHASONE SODIUM PHOSPHATE 10 MG/ML IJ SOLN
INTRAMUSCULAR | Status: AC
  Filled 2020-02-01: qty 1

## 2020-02-01 MED ORDER — ONDANSETRON HCL 4 MG/2ML IJ SOLN
INTRAMUSCULAR | Status: AC
  Filled 2020-02-01: qty 2

## 2020-02-01 MED ORDER — LIDOCAINE HCL (CARDIAC) PF 100 MG/5ML IV SOSY
PREFILLED_SYRINGE | INTRAVENOUS | Status: DC | PRN
  Administered 2020-02-01: 60 mg via INTRAVENOUS

## 2020-02-01 MED ORDER — ACETAMINOPHEN 10 MG/ML IV SOLN
INTRAVENOUS | Status: AC
  Filled 2020-02-01: qty 100

## 2020-02-01 MED ORDER — ROCURONIUM BROMIDE 10 MG/ML (PF) SYRINGE
PREFILLED_SYRINGE | INTRAVENOUS | Status: AC
  Filled 2020-02-01: qty 10

## 2020-02-01 MED ORDER — ORAL CARE MOUTH RINSE: 15.0000 mL | Freq: Once | OROMUCOSAL | Status: AC

## 2020-02-01 MED ORDER — FENTANYL CITRATE (PF) 100 MCG/2ML IJ SOLN
INTRAMUSCULAR | Status: AC
  Filled 2020-02-01: qty 2

## 2020-02-01 SURGICAL SUPPLY — 31 items
BAG DRAIN CYSTO-URO LG1000N (MISCELLANEOUS) ×2 IMPLANT
BAG DRN RND TRDRP ANRFLXCHMBR (UROLOGICAL SUPPLIES) ×1
BAG URINE DRAIN 2000ML AR STRL (UROLOGICAL SUPPLIES) ×2 IMPLANT
BRUSH SCRUB EZ  4% CHG (MISCELLANEOUS) ×1
BRUSH SCRUB EZ 4% CHG (MISCELLANEOUS) ×1 IMPLANT
CATH FOLEY 2WAY  5CC 16FR (CATHETERS) ×1
CATH FOLEY 2WAY 5CC 16FR (CATHETERS) ×1
CATH URTH 16FR FL 2W BLN LF (CATHETERS) ×1 IMPLANT
DRAPE UTILITY 15X26 TOWEL STRL (DRAPES) ×2 IMPLANT
DRSG TELFA 4X3 1S NADH ST (GAUZE/BANDAGES/DRESSINGS) ×2 IMPLANT
ELECT LOOP 22F BIPOLAR SML (ELECTROSURGICAL)
ELECT REM PT RETURN 9FT ADLT (ELECTROSURGICAL)
ELECTRODE LOOP 22F BIPOLAR SML (ELECTROSURGICAL) IMPLANT
ELECTRODE REM PT RTRN 9FT ADLT (ELECTROSURGICAL) IMPLANT
GLOVE BIO SURGEON STRL SZ 6.5 (GLOVE) ×2 IMPLANT
GOWN STRL REUS W/ TWL LRG LVL3 (GOWN DISPOSABLE) ×2 IMPLANT
GOWN STRL REUS W/TWL LRG LVL3 (GOWN DISPOSABLE) ×4
IV NS IRRIG 3000ML ARTHROMATIC (IV SOLUTION) IMPLANT
KIT TURNOVER CYSTO (KITS) ×2 IMPLANT
LOOP CUT BIPOLAR 24F LRG (ELECTROSURGICAL) IMPLANT
MANIFOLD NEPTUNE II (INSTRUMENTS) ×2 IMPLANT
NDL SAFETY ECLIPSE 18X1.5 (NEEDLE) ×1 IMPLANT
NEEDLE HYPO 18GX1.5 SHARP (NEEDLE) ×2
PACK CYSTO AR (MISCELLANEOUS) ×2 IMPLANT
PAD ARMBOARD 7.5X6 YLW CONV (MISCELLANEOUS) ×2 IMPLANT
SET IRRIG Y TYPE TUR BLADDER L (SET/KITS/TRAYS/PACK) ×2 IMPLANT
SURGILUBE 2OZ TUBE FLIPTOP (MISCELLANEOUS) ×2 IMPLANT
SYR TOOMEY IRRIG 70ML (MISCELLANEOUS) ×2
SYRINGE TOOMEY IRRIG 70ML (MISCELLANEOUS) ×1 IMPLANT
WATER STERILE IRR 1000ML POUR (IV SOLUTION) ×2 IMPLANT
WATER STERILE IRR 3000ML UROMA (IV SOLUTION) IMPLANT

## 2020-02-01 NOTE — Discharge Instructions (Signed)
Transurethral Resection of Bladder Tumor (TURBT) or Bladder Biopsy ° ° °Definition: ° Transurethral Resection of the Bladder Tumor is a surgical procedure used to diagnose and remove tumors within the bladder. TURBT is the most common treatment for early stage bladder cancer. ° °General instructions: °   ° Your recent bladder surgery requires very little post hospital care but some definite precautions. ° °Despite the fact that no skin incisions were used, the area around the bladder incisions are raw and covered with scabs to promote healing and prevent bleeding. Certain precautions are needed to insure that the scabs are not disturbed over the next 2-4 weeks while the healing proceeds. ° °Because the raw surface inside your bladder and the irritating effects of urine you may expect frequency of urination and/or urgency (a stronger desire to urinate) and perhaps even getting up at night more often. This will usually resolve or improve slowly over the healing period. You may see some blood in your urine over the first 6 weeks. Do not be alarmed, even if the urine was clear for a while. Get off your feet and drink lots of fluids until clearing occurs. If you start to pass clots or don't improve call us. ° °Diet: ° °You may return to your normal diet immediately. Because of the raw surface of your bladder, alcohol, spicy foods, foods high in acid and drinks with caffeine may cause irritation or frequency and should be used in moderation. To keep your urine flowing freely and avoid constipation, drink plenty of fluids during the day (8-10 glasses). Tip: Avoid cranberry juice because it is very acidic. ° °Activity: ° °Your physical activity doesn't need to be restricted. However, if you are very active, you may see some blood in the urine. We suggest that you reduce your activity under the circumstances until the bleeding has stopped. ° °Bowels: ° °It is important to keep your bowels regular during the postoperative  period. Straining with bowel movements can cause bleeding. A bowel movement every other day is reasonable. Use a mild laxative if needed, such as milk of magnesia 2-3 tablespoons, or 2 Dulcolax tablets. Call if you continue to have problems. If you had been taking narcotics for pain, before, during or after your surgery, you may be constipated. Take a laxative if necessary. ° ° ° °Medication: ° °You should resume your pre-surgery medications unless told not to. In addition you may be given an antibiotic to prevent or treat infection. Antibiotics are not always necessary. All medication should be taken as prescribed until the bottles are finished unless you are having an unusual reaction to one of the drugs. ° ° °[facility] °[place], [place] [address] °[phone] ° ° ° ° °

## 2020-02-01 NOTE — Op Note (Signed)
Date of procedure: 02/01/20  Preoperative diagnosis:  1. History of bladder cancer  Postoperative diagnosis:  1. Same as above  Procedure: 1. Cystoscopy 2. Repeat TURBT (small)  Surgeon: Vanna Scotland, MD  Anesthesia: General  Complications: None  Intraoperative findings: Healing erythema with some overlying necrotic material at dome, less than 1 cm as well as anterior and lateral bladder next less than 1 cm.  Representative areas for each of these biopsied and fulgurated.  No obvious active tumor appreciated.  EBL: Minimal  Specimens: Bladder tumor dome, bladder tumor bladder neck  Drains: 20 French Foley  Indication: Sarah Rush is a 60 y.o. patient with personal history of recurrent bladder cancer surgery most recently with high-grade T1 he returns today for repeat TURBT.  After reviewing the management options for treatment, she elected to proceed with the above surgical procedure(s). We have discussed the potential benefits and risks of the procedure, side effects of the proposed treatment, the likelihood of the patient achieving the goals of the procedure, and any potential problems that might occur during the procedure or recuperation. Informed consent has been obtained.  Description of procedure:  The patient was taken to the operating room and general anesthesia was induced.  The patient was placed in the dorsal lithotomy position, prepped and draped in the usual sterile fashion, and preoperative antibiotics were administered. A preoperative time-out was performed.   A 21 French scope was advanced per urethra into the bladder.  The bladder is carefully inspected.  This revealed a capacious right UO with adjacent stellate scar as well as an area of patchy erythema at the dome of the bladder with some overlying shaggy necrosis on erythematous base less than 1 cm area consistent with previous TUR site.  There is a similar area around the bladder neck extending from about  the 3:00 to 9:00 positions also less than 1 cm.  There is no obvious papillary tumor at this location.  The remainder of the bladder was also tumor free.  Next, use cold cup biopsy forceps to take representative biopsies/TURBT of the areas both at the dome as well as the bladder neck.  I took both areas of erythema as well as heaped up more edematous mucosa in each biopsy.  The entirety of each of the areas was then fulgurated until no residual abnormal tissue remained, only fulgurated tissue and hemostasis was excellent.  The bladder was then drained.  A 16 French Foley catheter was then placed using 10 cc of sterile water.  The patient was then cleaned and dried, repositioned supine position, reversed from anesthesia, taken to the PACU in stable condition.  2000 mg of intravesical gemcitabine was instilled to the bladder.  Was allowed to dwell for 1 hour in the PACU.  After 1 hour, the Foley catheter was removed after the chemo was drained.  This was well-tolerated.  We will have her return to the office next week to discuss her repeat TUR pathology as well as ongoing discussion of how to continue to manage her bladder cancer moving forward.  Vanna Scotland, M.D.

## 2020-02-01 NOTE — H&P (Signed)
History of Present Illness: Sarah Rush is a 60 y.o. year old w/ hx of bladder cancerwho returns today for cystoscopy surveillance cystoscopy after repeat induction of BCG completed in 09/2019.  She initially presented with microscopic hematuria. She underwent CT urogram which was concerning for bladder cancer.  She was taken to the operating room in 11/2018 for TURBT with a large tumor at the dome of the bladder. She received intravesical gemcitabine postoperatively. 3 cm tumor at the dome of the bladder was consistent with high-grade noninvasive urothelial carcinoma with CIS. Muscularis propria was present and uninvolved.  She underwent induction course of BCG x6 which was well-tolerated.  She is found to have recurrence on follow-up cystoscopy. She returned to the operating room on 06/2019 for multiple recurrent tumors. Surgical pathology again was consistent with high-grade TA TCC.  Most recently, she completed a reinduction course of BCG x6. This was completed in 09/2019.  She had a recurrence and return to the operating room on 12/21/2019.  This showed multiple tumors, 2 cm spherical tumor at the dome with a small sided lesion as well as a 1.5 cm tumor at the bladder neck possibly involving the right urethra.  Surgical pathology c/w with high-grade TA TCC and there is concern for possible laminal propria invasion and focally at the bladder neck tumor. pT1a.  Her last upper tract imaging cross sectional imaging was in 11/2018.  She does have urinary symptoms including urgency frequency and some dysuria.  UA today is pending.   Past Medical History:  Diagnosis Date  . Anxiety   . Cancer ([place])    bladder  . GERD (gastroesophageal reflux disease)   . Lower abdominal pain   . Rheumatoid arthritis ([place])   . Urinary tract infection symptoms     Past Surgical History:  Procedure Laterality Date  . [place]  . CHOLECYSTECTOMY    .  CYSTOSCOPY W/ RETROGRADES Bilateral 07/02/2019   Procedure: CYSTOSCOPY WITH RETROGRADE PYELOGRAM;  Surgeon: Hollice Espy, MD;  Location: [facility];  Service: Urology;  Laterality: Bilateral;  . CYSTOSCOPY W/ RETROGRADES Bilateral 12/21/2019   Procedure: CYSTOSCOPY WITH RETROGRADE PYELOGRAM;  Surgeon: Hollice Espy, MD;  Location: [facility];  Service: Urology;  Laterality: Bilateral;  . CYSTOSCOPY WITH BIOPSY N/A 12/22/2018   Procedure: CYSTOSCOPY WITH BIOPSY;  Surgeon: Hollice Espy, MD;  Location: [facility];  Service: Urology;  Laterality: N/A;  . TRANSURETHRAL RESECTION OF BLADDER TUMOR WITH MITOMYCIN-C N/A 12/22/2018   Procedure: TRANSURETHRAL RESECTION OF BLADDER TUMOR WITH Gemcitabine;  Surgeon: Hollice Espy, MD;  Location: [facility];  Service: Urology;  Laterality: N/A;  . TRANSURETHRAL RESECTION OF BLADDER TUMOR WITH MITOMYCIN-C N/A 07/02/2019   Procedure: TRANSURETHRAL RESECTION OF BLADDER TUMOR WITH Gemcitabine;  Surgeon: Hollice Espy, MD;  Location: [facility];  Service: Urology;  Laterality: N/A;  . TRANSURETHRAL RESECTION OF BLADDER TUMOR WITH MITOMYCIN-C N/A 12/21/2019   Procedure: TRANSURETHRAL RESECTION OF BLADDER TUMOR WITH Gemcitabine;  Surgeon: Hollice Espy, MD;  Location: [facility];  Service: Urology;  Laterality: N/A;    Home Medications:  Current Facility-Administered Medications for the 02/01/20 encounter [facility] Encounter)  Medication  . gemcitabine (GEMZAR) 2,000 mg in sodium chloride irrigation 0.9 % chemo infusion  . gemcitabine (GEMZAR) chemo syringe for bladder instillation 2,000 mg  . gemcitabine (GEMZAR) chemo syringe for bladder instillation 2,000 mg  . gemcitabine (GEMZAR) chemo syringe for bladder instillation 2,000 mg  . gemcitabine (GEMZAR) chemo syringe for bladder instillation 2,000 mg  .  gemcitabine (GEMZAR) chemo syringe for bladder instillation 2,000 mg   Current Meds  Medication Sig  . ciprofloxacin (CIPRO) 250 MG tablet Take 1 tablet (250 mg  total) by mouth 2 (two) times daily.  . hydroxychloroquine (PLAQUENIL) 200 MG tablet Take 200 mg by mouth 2 (two) times daily.  . Probiotic Product (PROBIOTIC PO) Take 1 capsule by mouth daily.  . sertraline (ZOLOFT) 50 MG tablet Take 75 mg by mouth at bedtime.    Allergies: No Known Allergies  History reviewed. No pertinent family history.  Social History:  reports that she quit smoking about 6 years ago. She has never used smokeless tobacco. She reports current alcohol use. She reports that she does not use drugs.  ROS: A complete review of systems was performed.  All systems are negative except for pertinent findings as noted.  Physical Exam:  Vital signs in last 24 hours: Temp:  [98.5 F (36.9 C)] 98.5 F (36.9 C) (01/03 1329) Pulse Rate:  [87] 87 (01/03 1329) Resp:  [16] 16 (01/03 1329) BP: (162)/(85) 162/85 (01/03 1329) SpO2:  [87 %] 87 % (01/03 1329) Weight:  [67 kg] 67 kg (01/03 1329) Constitutional:  Alert and oriented, No acute distress HEENT: [place] AT, moist mucus membranes.  Trachea midline, no masses Cardiovascular: Regular rate and rhythm, no clubbing, cyanosis, or edema. Respiratory: Normal respiratory effort, lungs clear bilaterally GI: Abdomen is soft, nontender, nondistended, no abdominal masses GU: No CVA tenderness Skin: No rashes, bruises or suspicious lesions Lymph: No cervical or inguinal adenopathy Neurologic: Grossly intact, no focal deficits, moving all 4 extremities Psychiatric: Normal mood and affect   Laboratory Data:  No results for input(s): WBC, HGB, HCT in the last 72 hours. No results for input(s): NA, K, CL, CO2, GLUCOSE, BUN, CREATININE, CALCIUM in the last 72 hours. No results for input(s): LABPT, INR in the last 72 hours. No results for input(s): LABURIN in the last 72 hours. Results for orders placed or performed during the hospital encounter of 01/29/20  SARS CORONAVIRUS 2 (TAT 6-24 HRS) Nasopharyngeal Nasopharyngeal Swab     Status:  None   Collection Time: 01/29/20  8:48 AM   Specimen: Nasopharyngeal Swab  Result Value Ref Range Status   SARS Coronavirus 2 NEGATIVE NEGATIVE Final    Comment: (NOTE) SARS-CoV-2 target nucleic acids are NOT DETECTED.  The SARS-CoV-2 RNA is generally detectable in upper and lower respiratory specimens during the acute phase of infection. Negative results do not preclude SARS-CoV-2 infection, do not rule out co-infections with other pathogens, and should not be used as the sole basis for treatment or other patient management decisions. Negative results must be combined with clinical observations, patient history, and epidemiological information. The expected result is Negative.  Fact Sheet for Patients: SugarRoll.be  Fact Sheet for Healthcare Providers: https://www.woods-mathews.com/  This test is not yet approved or cleared by the [place] FDA and  has been authorized for detection and/or diagnosis of SARS-CoV-2 by FDA under an Emergency Use Authorization (EUA). This EUA will remain  in effect (meaning this test can be used) for the duration of the COVID-19 declaration under Se ction 564(b)(1) of the Act, 21 U.S.C. section 360bbb-3(b)(1), unless the authorization is terminated or revoked sooner.  Performed at [facility], [place] [address], [place], [place] [address]       Impression/Assessment:    1. Cancer of dome of urinary bladder ([place]) Lengthy discussion today about surgical pathology, recurrent probable pT1 HgTCC  Overall, the nature of  her very frequent recurrences both in volume as well as concerning pathology despite 2 induction course of BCG is very concerning.  Based on his most recent pathology, I recommended returning to the operating room for repeat TUR at around 3 weeks postop to ensure that there is no upstaging.  Would recommend consideration of repeat upper tract cross-sectional imaging.  We did  frank and honest discussion today regarding my concern for the aggressiveness of her disease.  She is relatively young and healthy and bladder cancer will likely continue to become a recurrent and possibly progressive issue for her.  I have urged her to consider cystectomy as one option.  We briefly discussed the procedure as well as diversion techniques, based on the proximity to her urethra and possible bladder neck invasion, she may not be a candidate for neobladder.  I have urged her to follow-up with my colleague, Dr. Dwana Curd in [place] to continue this conversation.  If she does not like to pursue this, given that she has failed to induction course of BCG with very prompt recurrences, will plan to try an induction course of intravesical gemcitabine x6 weeks.  We discussed this will be done over at the cancer center.  We discussed possible side effects including irritative voiding symptoms, infection need for Foley catheter for the procedure, amongst others.   We will plan for repeat cystoscopy in no less than 3 months after her last TUR.  All questions answered.  - Urinalysis, Complete - CULTURE, URINE COMPREHENSIVE - Ambulatory referral to Urology

## 2020-02-01 NOTE — Anesthesia Procedure Notes (Signed)
Procedure Name: LMA Insertion Date/Time: 02/01/2020 2:40 PM Performed by: Omer Jack, CRNA Pre-anesthesia Checklist: Patient identified, Patient being monitored, Timeout performed, Emergency Drugs available and Suction available Patient Re-evaluated:Patient Re-evaluated prior to induction Oxygen Delivery Method: Circle system utilized Preoxygenation: Pre-oxygenation with 100% oxygen Induction Type: IV induction Ventilation: Mask ventilation without difficulty LMA: LMA inserted LMA Size: 3.5 Tube type: Oral Number of attempts: 1 Placement Confirmation: positive ETCO2 and breath sounds checked- equal and bilateral Tube secured with: Tape Dental Injury: Teeth and Oropharynx as per pre-operative assessment

## 2020-02-01 NOTE — Progress Notes (Signed)
Foley unclamped and draining per order.

## 2020-02-01 NOTE — Anesthesia Preprocedure Evaluation (Addendum)
Anesthesia Evaluation  Patient identified by MRN, date of birth, ID band Patient awake    Reviewed: Allergy & Precautions, H&P , NPO status , Patient's Chart, lab work & pertinent test results  History of Anesthesia Complications Negative for: history of anesthetic complications  Airway Mallampati: III  TM Distance: >3 FB Neck ROM: full    Dental  (+) Upper Dentures, Lower Dentures   Pulmonary neg shortness of breath, former smoker,    Pulmonary exam normal        Cardiovascular Exercise Tolerance: Good (-) angina(-) Past MI and (-) DOE negative cardio ROS Normal cardiovascular exam     Neuro/Psych PSYCHIATRIC DISORDERS negative neurological ROS     GI/Hepatic Neg liver ROS, GERD  Medicated and Controlled,  Endo/Other  negative endocrine ROS  Renal/GU      Musculoskeletal   Abdominal   Peds  Hematology negative hematology ROS (+)   Anesthesia Other Findings Past Medical History: No date: Anxiety No date: Cancer [facility])     Comment:  bladder No date: GERD (gastroesophageal reflux disease) No date: Lower abdominal pain No date: Rheumatoid arthritis (HCC) No date: Urinary tract infection symptoms  Past Surgical History: 1982: CESAREAN SECTION No date: CHOLECYSTECTOMY 07/02/2019: CYSTOSCOPY W/ RETROGRADES; Bilateral     Comment:  Procedure: CYSTOSCOPY WITH RETROGRADE PYELOGRAM;                Surgeon: Vanna Scotland, MD;  Location: [facility];                Service: Urology;  Laterality: Bilateral; 12/21/2019: CYSTOSCOPY W/ RETROGRADES; Bilateral     Comment:  Procedure: CYSTOSCOPY WITH RETROGRADE PYELOGRAM;                Surgeon: Vanna Scotland, MD;  Location: [facility];                Service: Urology;  Laterality: Bilateral; 12/22/2018: CYSTOSCOPY WITH BIOPSY; N/A     Comment:  Procedure: CYSTOSCOPY WITH BIOPSY;  Surgeon: Vanna Scotland, MD;  Location: [facility];  Service: Urology;                 Laterality: N/A; 12/22/2018: TRANSURETHRAL RESECTION OF BLADDER TUMOR WITH MITOMYCIN- C; N/A     Comment:  Procedure: TRANSURETHRAL RESECTION OF BLADDER TUMOR WITH              Gemcitabine;  Surgeon: Vanna Scotland, MD;  Location:               [facility];  Service: Urology;  Laterality: N/A; 07/02/2019: TRANSURETHRAL RESECTION OF BLADDER TUMOR WITH MITOMYCIN-C;  N/A     Comment:  Procedure: TRANSURETHRAL RESECTION OF BLADDER TUMOR WITH              Gemcitabine;  Surgeon: Vanna Scotland, MD;  Location:               [facility];  Service: Urology;  Laterality: N/A; 12/21/2019: TRANSURETHRAL RESECTION OF BLADDER TUMOR WITH MITOMYCIN- C; N/A     Comment:  Procedure: TRANSURETHRAL RESECTION OF BLADDER TUMOR WITH              Gemcitabine;  Surgeon: Vanna Scotland, MD;  Location:               [facility];  Service: Urology;  Laterality: N/A;     Reproductive/Obstetrics negative OB ROS  Anesthesia Physical Anesthesia Plan  ASA: III  Anesthesia Plan: General LMA   Post-op Pain Management:    Induction: Intravenous  PONV Risk Score and Plan: Ondansetron, Dexamethasone, Midazolam and Treatment may vary due to age or medical condition  Airway Management Planned: LMA  Additional Equipment:   Intra-op Plan:   Post-operative Plan: Extubation in OR  Informed Consent: I have reviewed the patients History and Physical, chart, labs and discussed the procedure including the risks, benefits and alternatives for the proposed anesthesia with the patient or authorized representative who has indicated his/her understanding and acceptance.     Dental Advisory Given  Plan Discussed with: Anesthesiologist, CRNA and Surgeon  Anesthesia Plan Comments: (Patient consented for risks of anesthesia including but not limited to:  - adverse reactions to medications - damage to eyes, teeth, lips or other oral mucosa - nerve damage due to positioning   - sore throat or hoarseness - Damage to heart, brain, nerves, lungs, other parts of body or loss of life  Patient voiced understanding.)       Anesthesia Quick Evaluation

## 2020-02-01 NOTE — Anesthesia Postprocedure Evaluation (Signed)
Anesthesia Post Note  Patient: Sarah Rush  Procedure(s) Performed: TRANSURETHRAL RESECTION OF BLADDER TUMOR WITH Gemcitabine (N/A )  Patient location during evaluation: PACU Anesthesia Type: General Level of consciousness: awake and alert Pain management: pain level controlled Vital Signs Assessment: post-procedure vital signs reviewed and stable Respiratory status: spontaneous breathing and respiratory function stable Cardiovascular status: stable Anesthetic complications: no   No complications documented.   Last Vitals:  Vitals:   02/01/20 1536 02/01/20 1604  BP: (!) 152/72 (!) 154/75  Pulse:  72  Resp: 13 16  Temp:  36.8 C  SpO2: 98% 95%    Last Pain:  Vitals:   02/01/20 1604  TempSrc:   PainSc: 0-No pain                 [name] K

## 2020-02-01 NOTE — Transfer of Care (Signed)
Immediate Anesthesia Transfer of Care Note  Patient: Sarah Rush  Procedure(s) Performed: TRANSURETHRAL RESECTION OF BLADDER TUMOR WITH Gemcitabine (N/A )  Patient Location: PACU  Anesthesia Type:General  Level of Consciousness: drowsy and patient cooperative  Airway & Oxygen Therapy: Patient Spontanous Breathing and Patient connected to face mask oxygen  Post-op Assessment: Report given to RN and Post -op Vital signs reviewed and stable  Post vital signs: Reviewed and stable  Last Vitals:  Vitals Value Taken Time  BP 130/69 02/01/20 1520  Temp    Pulse 75 02/01/20 1521  Resp 21 02/01/20 1521  SpO2 100 % 02/01/20 1521  Vitals shown include unvalidated device data.  Last Pain:  Vitals:   02/01/20 1329  TempSrc: Oral  PainSc: 0-No pain         Complications: No complications documented.

## 2020-02-02 ENCOUNTER — Encounter: Payer: Self-pay | Admitting: Urology

## 2020-02-03 LAB — SURGICAL PATHOLOGY

## 2020-02-09 ENCOUNTER — Ambulatory Visit (INDEPENDENT_AMBULATORY_CARE_PROVIDER_SITE_OTHER): Payer: Commercial Managed Care - PPO | Admitting: Urology

## 2020-02-09 ENCOUNTER — Other Ambulatory Visit: Payer: Self-pay

## 2020-02-09 VITALS — BP 131/74 | HR 89 | Ht 64.0 in | Wt 148.0 lb

## 2020-02-09 DIAGNOSIS — R300 Dysuria: Secondary | ICD-10-CM | POA: Diagnosis not present

## 2020-02-09 DIAGNOSIS — C671 Malignant neoplasm of dome of bladder: Secondary | ICD-10-CM

## 2020-02-09 LAB — MICROSCOPIC EXAMINATION: RBC, Urine: >30 /hpf — AB (ref 0–2)

## 2020-02-09 LAB — URINALYSIS, COMPLETE
Bilirubin, UA: NEGATIVE
Glucose, UA: NEGATIVE
Ketones, UA: NEGATIVE
Nitrite, UA: NEGATIVE
Specific Gravity, UA: >1.03 — ABNORMAL HIGH (ref 1.005–1.030)
Urobilinogen, Ur: 0.2 mg/dL (ref 0.2–1.0)
pH, UA: 5 (ref 5.0–7.5)

## 2020-02-09 NOTE — Progress Notes (Signed)
02/09/2020 9:18 AM   Sarah Rush 1960/03/12 [id number]  Referring provider: Danae Orleans, MD [place],  [place] [address]  Chief Complaint  Patient presents with   Follow-up    HPI: 60 year old female with history of high-grade T1 TCC, frequent recurrences status post induction BCG x2 returns today following repeat TURBT.  No additional tumor was identified on this specimen.  In the interim since last visit, she has been seen and evaluated by Dr. Bess Harvest for consideration of cystectomy.  She primarily presents for further into this discussion today.  Please see previous notes for cancer details.  Today, she reports some mild burning with urination.  This initially completely resolved following her last TURBT but is started to recur.  It is mild.  She has no other associated urinary symptoms.  She wonders if she might have an infection.  She reports that she is leaning towards cystectomy.  She has been talking to her friend who had a prophylactic mastectomy.  She is worried a little bit about her recovery and overall implications.   PMH: Past Medical History:  Diagnosis Date   Anxiety    Cancer ([place])    bladder   GERD (gastroesophageal reflux disease)    Lower abdominal pain    Rheumatoid arthritis ([facility])    Urinary tract infection symptoms     Surgical History: Past Surgical History:  Procedure Laterality Date   CESAREAN SECTION  1982   CHOLECYSTECTOMY     CYSTOSCOPY W/ RETROGRADES Bilateral 07/02/2019   Procedure: CYSTOSCOPY WITH RETROGRADE PYELOGRAM;  Surgeon: Hollice Espy, MD;  Location: [facility];  Service: Urology;  Laterality: Bilateral;   CYSTOSCOPY W/ RETROGRADES Bilateral 12/21/2019   Procedure: CYSTOSCOPY WITH RETROGRADE PYELOGRAM;  Surgeon: Hollice Espy, MD;  Location: [facility];  Service: Urology;  Laterality: Bilateral;   CYSTOSCOPY WITH BIOPSY N/A 12/22/2018   Procedure: CYSTOSCOPY WITH BIOPSY;   Surgeon: Hollice Espy, MD;  Location: [facility];  Service: Urology;  Laterality: N/A;   TRANSURETHRAL RESECTION OF BLADDER TUMOR WITH MITOMYCIN-C N/A 12/22/2018   Procedure: TRANSURETHRAL RESECTION OF BLADDER TUMOR WITH Gemcitabine;  Surgeon: Hollice Espy, MD;  Location: [facility];  Service: Urology;  Laterality: N/A;   TRANSURETHRAL RESECTION OF BLADDER TUMOR WITH MITOMYCIN-C N/A 07/02/2019   Procedure: TRANSURETHRAL RESECTION OF BLADDER TUMOR WITH Gemcitabine;  Surgeon: Hollice Espy, MD;  Location: [facility];  Service: Urology;  Laterality: N/A;   TRANSURETHRAL RESECTION OF BLADDER TUMOR WITH MITOMYCIN-C N/A 12/21/2019   Procedure: TRANSURETHRAL RESECTION OF BLADDER TUMOR WITH Gemcitabine;  Surgeon: Hollice Espy, MD;  Location: [facility];  Service: Urology;  Laterality: N/A;   TRANSURETHRAL RESECTION OF BLADDER TUMOR WITH MITOMYCIN-C N/A 02/01/2020   Procedure: TRANSURETHRAL RESECTION OF BLADDER TUMOR WITH Gemcitabine;  Surgeon: Hollice Espy, MD;  Location: [facility];  Service: Urology;  Laterality: N/A;    Home Medications:  Allergies as of 02/09/2020   No Known Allergies     Medication List       Accurate as of February 09, 2020  9:18 AM. If you have any questions, ask your nurse or doctor.        STOP taking these medications   ciprofloxacin 250 MG tablet Commonly known as: Cipro Stopped by: Hollice Espy, MD   dicyclomine 10 MG capsule Commonly known as: Bentyl Stopped by: Hollice Espy, MD     TAKE these medications   acetaminophen 500 MG tablet Commonly known as: TYLENOL Take 1,000 mg by mouth every 6 (  six) hours as needed (for pain.).   hydroxychloroquine 200 MG tablet Commonly known as: PLAQUENIL Take 200 mg by mouth 2 (two) times daily.   ibuprofen 200 MG tablet Commonly known as: ADVIL Take 400 mg by mouth every 8 (eight) hours as needed (for pain).   omeprazole 20 MG capsule Commonly known as: PRILOSEC Take 20 mg by mouth daily as needed  (heartburn/indigestion.).   ondansetron 4 MG disintegrating tablet Commonly known as: Zofran ODT Take 1 tablet (4 mg total) by mouth every 8 (eight) hours as needed for nausea or vomiting.   PROBIOTIC PO Take 1 capsule by mouth daily.   sertraline 50 MG tablet Commonly known as: ZOLOFT Take 75 mg by mouth at bedtime.       Allergies: No Known Allergies  Family History: No family history on file.  Social History:  reports that she quit smoking about 6 years ago. She has never used smokeless tobacco. She reports current alcohol use. She reports that she does not use drugs.   Physical Exam: BP 131/74    Pulse 89    Ht 5\' 4"  (1.626 m)    Wt 148 lb (67.1 kg)    BMI 25.40 kg/m   Constitutional:  Alert and oriented, No acute distress. HEENT: [place] AT, moist mucus membranes.  Trachea midline, no masses. Cardiovascular: No clubbing, cyanosis, or edema. Respiratory: Normal respiratory effort, no increased work of breathing. Skin: No rashes, bruises or suspicious lesions. Neurologic: Grossly intact, no focal deficits, moving all 4 extremities. Psychiatric: Normal mood and affect.   Urinalysis UA today with 11-30 WBCs, greater than 30 RBCs, calcium oxalate crystals and few bacteria, nitrate negative.   Assessment & Plan:    1. Cancer of dome of urinary bladder (HCC) Recurrent high-grade TA/T1/CIS bladder cancer with fairly prompt recurrences, less than a year with progressively worse disease despite induction BCG x2  As per previous discussion, she is strongly considering cystectomy for organ confined disease.  Alternatively, repeat TUR with no residual disease, could consider intravesical chemotherapy in the form of gemcitabine induction given that she is failed BCG x2.  Lengthy discussion today again.  She is leaning towards cystectomy which I think is an excellent choice based on her excellent performance status, relatively overall good health and motivation.  She is here to call  Dr. Zettie Pho office.  If she elects not to pursue this, she will call our office to arrange for intravesical chemotherapy and continued surveillance.  2. Dysuria Mild dysuria today, urinalysis consistent with recent TUR  Supportive care, increase fluids, Pyridium as needed.  We will send culture to rule out infection although not suspected.  We will treat as needed.  Advised to call if symptoms worsen  - Urinalysis, Complete - CULTURE, URINE COMPREHENSIVE  Hollice Espy, MD  [facility] [address], [place], [place] [address] [phone]  I spent 30 total minutes on the day of the encounter including pre-visit review of the medical record, face-to-face time with the patient, and post visit ordering of labs/imaging/tests.

## 2020-02-12 ENCOUNTER — Telehealth: Payer: Self-pay | Admitting: *Deleted

## 2020-02-12 LAB — CULTURE, URINE COMPREHENSIVE

## 2020-02-12 MED ORDER — NITROFURANTOIN MONOHYD MACRO 100 MG PO CAPS: 100.0000 mg | ORAL_CAPSULE | Freq: Two times a day (BID) | ORAL | 0 refills | Status: DC

## 2020-02-12 NOTE — Telephone Encounter (Addendum)
Patient notified, sent in RX. Voiced understanding.    ----- Message from Hollice Espy, MD sent at 02/12/2020  1:40 PM EST ----- Urine culture did grow a low colony count of bacteria.  Given that she was having symptoms, it was good treated with Macrobid 100 mg twice daily for 10 days in the setting of recent surgery.  Hollice Espy, MD

## 2020-02-24 ENCOUNTER — Other Ambulatory Visit: Payer: Self-pay | Admitting: Urology

## 2020-04-07 ENCOUNTER — Inpatient Hospital Stay (HOSPITAL_COMMUNITY)
Admission: AD | Admit: 2020-04-07 | Discharge: 2020-04-13 | DRG: 654 | Disposition: A | Discharge status: Home / Self Care | Payer: Commercial Managed Care - PPO | Source: Ambulatory Visit | Attending: Urology | Admitting: Urology

## 2020-04-07 ENCOUNTER — Encounter (HOSPITAL_COMMUNITY): Payer: Self-pay | Admitting: Urology

## 2020-04-07 ENCOUNTER — Other Ambulatory Visit: Payer: Self-pay

## 2020-04-07 ENCOUNTER — Other Ambulatory Visit: Payer: Self-pay | Admitting: Urology

## 2020-04-07 DIAGNOSIS — C679 Malignant neoplasm of bladder, unspecified: Principal | ICD-10-CM | POA: Diagnosis present

## 2020-04-07 DIAGNOSIS — K567 Ileus, unspecified: Secondary | ICD-10-CM | POA: Diagnosis not present

## 2020-04-07 DIAGNOSIS — Z87891 Personal history of nicotine dependence: Secondary | ICD-10-CM | POA: Diagnosis not present

## 2020-04-07 DIAGNOSIS — Z79899 Other long term (current) drug therapy: Secondary | ICD-10-CM | POA: Diagnosis not present

## 2020-04-07 DIAGNOSIS — E669 Obesity, unspecified: Secondary | ICD-10-CM | POA: Diagnosis present

## 2020-04-07 DIAGNOSIS — K219 Gastro-esophageal reflux disease without esophagitis: Secondary | ICD-10-CM | POA: Diagnosis present

## 2020-04-07 DIAGNOSIS — Z20822 Contact with and (suspected) exposure to covid-19: Secondary | ICD-10-CM | POA: Diagnosis present

## 2020-04-07 DIAGNOSIS — Z6826 Body mass index (BMI) 26.0-26.9, adult: Secondary | ICD-10-CM | POA: Diagnosis not present

## 2020-04-07 DIAGNOSIS — M069 Rheumatoid arthritis, unspecified: Secondary | ICD-10-CM | POA: Diagnosis present

## 2020-04-07 LAB — COMPREHENSIVE METABOLIC PANEL
ALT: 18 U/L (ref 0–44)
AST: 16 U/L (ref 15–41)
Albumin: 4.2 g/dL (ref 3.5–5.0)
Alkaline Phosphatase: 92 U/L (ref 38–126)
Anion gap: 10 (ref 5–15)
BUN: 13 mg/dL (ref 6–20)
CO2: 26 mmol/L (ref 22–32)
Calcium: 9.2 mg/dL (ref 8.9–10.3)
Chloride: 102 mmol/L (ref 98–111)
Creatinine, Ser: 0.66 mg/dL (ref 0.44–1.00)
GFR, Estimated: >60 mL/min (ref >60)
Glucose, Bld: 94 mg/dL (ref 70–99)
Potassium: 3.4 mmol/L — ABNORMAL LOW (ref 3.5–5.1)
Sodium: 138 mmol/L (ref 135–145)
Total Bilirubin: 0.8 mg/dL (ref 0.3–1.2)
Total Protein: 6.7 g/dL (ref 6.5–8.1)

## 2020-04-07 LAB — CBC
HCT: 39.4 % (ref 36.0–46.0)
Hemoglobin: 12.9 g/dL (ref 12.0–15.0)
MCH: 27.9 pg (ref 26.0–34.0)
MCHC: 32.7 g/dL (ref 30.0–36.0)
MCV: 85.3 fL (ref 80.0–100.0)
Platelets: 225 10*3/uL (ref 150–400)
RBC: 4.62 MIL/uL (ref 3.87–5.11)
RDW: 13.2 % (ref 11.5–15.5)
WBC: 5.9 10*3/uL (ref 4.0–10.5)
nRBC: 0 % (ref 0.0–0.2)

## 2020-04-07 LAB — SARS CORONAVIRUS 2 BY RT PCR (HOSPITAL ORDER, PERFORMED IN ~~LOC~~ HOSPITAL LAB): SARS Coronavirus 2: NEGATIVE

## 2020-04-07 LAB — ABO/RH: ABO/RH(D): O POS

## 2020-04-07 LAB — PREPARE RBC (CROSSMATCH)

## 2020-04-07 MED ORDER — SERTRALINE HCL 50 MG PO TABS
75.0000 mg | ORAL_TABLET | Freq: Every day | ORAL | Status: DC
  Administered 2020-04-07 – 2020-04-12 (×6): 75 mg via ORAL
  Filled 2020-04-07 (×6): qty 1

## 2020-04-07 MED ORDER — MUPIROCIN 2 % EX OINT: 1.0000 "application " | TOPICAL_OINTMENT | Freq: Two times a day (BID) | CUTANEOUS | Status: DC

## 2020-04-07 MED ORDER — PEG 3350-KCL-NA BICARB-NACL 420 G PO SOLR
4000.0000 mL | Freq: Once | ORAL | Status: AC
  Administered 2020-04-07: 4000 mL via ORAL

## 2020-04-07 MED ORDER — PANTOPRAZOLE SODIUM 40 MG PO TBEC
40.0000 mg | DELAYED_RELEASE_TABLET | Freq: Every day | ORAL | Status: DC
  Administered 2020-04-07 – 2020-04-12 (×5): 40 mg via ORAL
  Filled 2020-04-07 (×6): qty 1

## 2020-04-07 MED ORDER — PIPERACILLIN-TAZOBACTAM 3.375 G IVPB
3.3750 g | INTRAVENOUS | Status: AC
  Administered 2020-04-08: 3.375 g via INTRAVENOUS
  Filled 2020-04-07: qty 50

## 2020-04-07 NOTE — H&P (Signed)
Sarah Rush is an 60 y.o. female.    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 Most recetn TURBT 11/2019 by Dr. Erlene Quan. Single ureters bilaterally, still has uterus in situ. Restaging CT 12/2019 w/o distant or upper tract disease.   PMH sig for RA/plaquinil (mild), mild obesity, lap chole. NO ischemic CV disease / blood thinners. 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 Husband at home that is in fair health that she helps care for, daughter also local. Her PCP is Dr. Greer Ee, [facility] in [place]   Today "Sarah Rush" is seen as pre-op admission for curative intent pelvic exenteration with conduit diversion. She has held plaquenil as directed.    Past Medical History:  Diagnosis Date  . Anxiety   . Cancer ([place])    bladder  . GERD (gastroesophageal reflux disease)   . Lower abdominal pain   . Rheumatoid arthritis ([place])   . Urinary tract infection symptoms     Past Surgical History:  Procedure Laterality Date  . [place]  . CHOLECYSTECTOMY    . CYSTOSCOPY W/ RETROGRADES Bilateral 07/02/2019   Procedure: CYSTOSCOPY WITH RETROGRADE PYELOGRAM;  Surgeon: Hollice Espy, MD;  Location: [facility] ORS;  Service: Urology;  Laterality: Bilateral;  . CYSTOSCOPY W/ RETROGRADES Bilateral 12/21/2019   Procedure: CYSTOSCOPY WITH RETROGRADE PYELOGRAM;  Surgeon: Hollice Espy, MD;  Location: [facility];  Service: Urology;  Laterality: Bilateral;  . CYSTOSCOPY WITH BIOPSY N/A 12/22/2018   Procedure: CYSTOSCOPY WITH BIOPSY;  Surgeon: Hollice Espy, MD;  Location: [facility] OR[facility];  Service: Urology;  Laterality: N/A;  . TRANSURETHRAL RESECTION OF BLADDER TUMOR WITH MITOMYCIN-C N/A 12/22/2018   Procedure: TRANSURETHRAL RESECTION OF BLADDER TUMOR WITH Gemcitabine;   Surgeon: Hollice Espy, MD;  Location: [facility] OR[facility];  Service: Urology;  Laterality: N/A;  . TRANSURETHRAL RESECTION OF BLADDER TUMOR WITH MITOMYCIN-C N/A 07/02/2019   Procedure: TRANSURETHRAL RESECTION OF BLADDER TUMOR WITH Gemcitabine;  Surgeon: Hollice Espy, MD;  Location: [facility];  Service: Urology;  Laterality: N/A;  . TRANSURETHRAL RESECTION OF BLADDER TUMOR WITH MITOMYCIN-C N/A 12/21/2019   Procedure: TRANSURETHRAL RESECTION OF BLADDER TUMOR WITH Gemcitabine;  Surgeon: Hollice Espy, MD;  Location: [facility];  Service: Urology;  Laterality: N/A;  . TRANSURETHRAL RESECTION OF BLADDER TUMOR WITH MITOMYCIN-C N/A 02/01/2020   Procedure: TRANSURETHRAL RESECTION OF BLADDER TUMOR WITH Gemcitabine;  Surgeon: Hollice Espy, MD;  Location: [facility] OR[facility];  Service: Urology;  Laterality: N/A;    No family history on file. Social History:  reports that she quit smoking about 6 years ago. She has never used smokeless tobacco. She reports current alcohol use. She reports that she does not use drugs.  Allergies: No Known Allergies  Facility-Administered Medications Prior to Admission  Medication Dose Route Frequency Provider Last Rate Last Admin  . gemcitabine (GEMZAR) 2,000 mg in sodium chloride irrigation 0.9 % chemo infusion  2,000 mg Irrigation Once Hollice Espy, MD      . gemcitabine [facility]) chemo syringe for bladder instillation 2,000 mg  2,000 mg Bladder Instillation Once Hollice Espy, MD      . gemcitabine [facility]) chemo syringe for bladder instillation 2,000 mg  2,000 mg Bladder Instillation Once Hollice Espy, MD      . gemcitabine [facility]) chemo syringe for bladder instillation 2,000 mg  2,000 mg  Bladder Instillation Once Hollice Espy, MD      . gemcitabine [facility]) chemo syringe for bladder instillation 2,000 mg  2,000 mg Bladder Instillation Once Hollice Espy, MD      . gemcitabine [facility]) chemo syringe for bladder instillation 2,000 mg  2,000 mg Bladder Instillation Once  Hollice Espy, MD       Medications Prior to Admission  Medication Sig Dispense Refill  . acetaminophen (TYLENOL) 500 MG tablet Take 1,000 mg by mouth every 6 (six) hours as needed for moderate pain.    . hydroxychloroquine (PLAQUENIL) 200 MG tablet Take 200 mg by mouth 2 (two) times daily.    Marland Kitchen ibuprofen (ADVIL) 200 MG tablet Take 400 mg by mouth every 6 (six) hours as needed for moderate pain.    Marland Kitchen omeprazole (PRILOSEC) 20 MG capsule Take 20 mg by mouth daily as needed (heartburn/indigestion.).    Marland Kitchen predniSONE (DELTASONE) 5 MG tablet Take 5 mg by mouth daily with breakfast.    . sertraline (ZOLOFT) 50 MG tablet Take 75 mg by mouth at bedtime.    . nitrofurantoin, macrocrystal-monohydrate, (MACROBID) 100 MG capsule Take 1 capsule (100 mg total) by mouth 2 (two) times daily. (Patient not taking: No sig reported) 20 capsule 0  . ondansetron (ZOFRAN ODT) 4 MG disintegrating tablet Take 1 tablet (4 mg total) by mouth every 8 (eight) hours as needed for nausea or vomiting. 20 tablet 0  . Probiotic Product (PROBIOTIC PO) Take 1 capsule by mouth daily. (Patient not taking: Reported on 03/28/2020)      Results for orders placed or performed during the hospital encounter of 04/07/20 (from the past 48 hour(s))  CBC     Status: None   Collection Time: 04/07/20  4:43 PM  Result Value Ref Range   WBC 5.9 4.0 - 10.5 K/uL   RBC 4.62 3.87 - 5.11 MIL/uL   Hemoglobin 12.9 12.0 - 15.0 g/dL   HCT 39.4 36.0 - 46.0 %   MCV 85.3 80.0 - 100.0 fL   MCH 27.9 26.0 - 34.0 pg   MCHC 32.7 30.0 - 36.0 g/dL   RDW 13.2 11.5 - 15.5 %   Platelets 225 150 - 400 K/uL   nRBC 0.0 0.0 - 0.2 %    Comment: Performed at [facility], [place] [address], [place], [place] [address]  Comprehensive metabolic panel     Status: Abnormal   Collection Time: 04/07/20  4:43 PM  Result Value Ref Range   Sodium 138 135 - 145 mmol/L   Potassium 3.4 (L) 3.5 - 5.1 mmol/L   Chloride 102 98 - 111 mmol/L   CO2 26 22 - 32  mmol/L   Glucose, Bld 94 70 - 99 mg/dL    Comment: Glucose reference range applies only to samples taken after fasting for at least 8 hours.   BUN 13 6 - 20 mg/dL   Creatinine, Ser 0.66 0.44 - 1.00 mg/dL   Calcium 9.2 8.9 - 10.3 mg/dL   Total Protein 6.7 6.5 - 8.1 g/dL   Albumin 4.2 3.5 - 5.0 g/dL   AST 16 15 - 41 U/L   ALT 18 0 - 44 U/L   Alkaline Phosphatase 92 38 - 126 U/L   Total Bilirubin 0.8 0.3 - 1.2 mg/dL   GFR, Estimated >60 >60 mL/min    Comment: (NOTE) Calculated using the CKD-EPI Creatinine Equation (2021)    Anion gap 10 5 - 15    Comment: Performed at [facility], [place] [address] [place], [place] [address]  Prepare RBC (crossmatch)     Status: None   Collection Time: 04/07/20  4:43 PM  Result Value Ref Range   Order Confirmation      ORDER PROCESSED BY BLOOD BANK Performed at [facility], [place] [address], [place], [place] [address]   Type and screen [place]     Status: None (Preliminary result)   Collection Time: 04/07/20  4:43 PM  Result Value Ref Range   ABO/RH(D) PENDING    Antibody Screen PENDING    Sample Expiration      04/10/2020,2359 Performed at [facility], [place] [address] [place], [place] [address]    No results found.  Review of Systems  Constitutional: Negative for chills and fever.  HENT: Negative.   Eyes: Negative.   Respiratory: Negative.   Cardiovascular: Negative.   Gastrointestinal: Negative.   Endocrine: Negative.   Genitourinary: Negative.   Musculoskeletal: Negative.   All other systems reviewed and are negative.   There were no vitals taken for this visit. Physical Exam Vitals reviewed.  HENT:     Head: Normocephalic.  Eyes:     Pupils: Pupils are equal, round, and reactive to light.  Cardiovascular:     Rate and Rhythm: Normal rate.     Pulses: Normal pulses.  Abdominal:     General: Abdomen is flat.     Comments: Stable mild obesity.  RLQ urostomuy marking site noted.   Genitourinary:    Comments: No CVAT Musculoskeletal:        General: Normal range of motion.     Cervical back: Normal range of motion.  Neurological:     General: No focal deficit present.     Mental Status: She is alert.  Psychiatric:        Mood and Affect: Mood normal.      Assessment/Plan Bowel Prep, stomal marking, CBC, BMP, T+C 2 units, Clears, NPO p MN in preparation for major extirpative surgery for recurrent bladder cancer. Risks (incluidnig mortality and non-cure), benefits, alternatives, expected peri-op course (about 7 day hospitalization, home with home health, 38mos full redovery) discussed previously and reiterated today.   Alexis Frock, MD 04/07/2020, 5:43 PM

## 2020-04-07 NOTE — Plan of Care (Signed)
Care Plan initiated

## 2020-04-07 NOTE — Consult Note (Signed)
[place] Nurse requested for preoperative stoma site marking  Discussed surgical procedure and stoma creation with patient and family. Spouse and daughter in the room.  Explained role of the [place] nurse team.  Provided the patient with educational booklet and provided samples of pouching options.  Answered patient and family questions.   Examined patient lying, sitting, and standing in order to place the marking in the patient's visual field, away from any creases or abdominal contour issues and within the rectus muscle. Patient wears her pants just below umbilicus and has abdominal pannus that appears when she is sitting. I marked above umbilicus for those reasons.      Marked for ileal conduit in the RLQ 4 cm to the right of the umbilicus and  4 cm above the umbilicus.   Patient's abdomen cleansed with CHG wipes at site markings, allowed to air dry prior to marking.Covered mark with thin film transparent dressing to preserve mark until date of surgery.   [place] Nurse team will follow up with patient after surgery for continue ostomy care and teaching.   Domenic Moras MSN, RN, FNP-BC CWON Wound, Ostomy, Continence Nurse Pager [phone]

## 2020-04-07 NOTE — Plan of Care (Signed)
  Problem: Education: Goal: Knowledge of General Education information will improve Description: Including pain rating scale, medication(s)/side effects and non-pharmacologic comfort measures Outcome: Progressing   Problem: Clinical Measurements: Goal: Ability to maintain clinical measurements within normal limits will improve Outcome: Progressing Goal: Will remain free from infection Outcome: Progressing   Problem: Nutrition: Goal: Adequate nutrition will be maintained Outcome: Progressing   Problem: Coping: Goal: Level of anxiety will decrease Outcome: Progressing   Problem: Elimination: Goal: Will not experience complications related to bowel motility Outcome: Progressing

## 2020-04-08 ENCOUNTER — Inpatient Hospital Stay (HOSPITAL_COMMUNITY): Payer: Commercial Managed Care - PPO | Admitting: Anesthesiology

## 2020-04-08 ENCOUNTER — Encounter (HOSPITAL_COMMUNITY)
Admission: AD | Disposition: A | Discharge status: Home / Self Care | Payer: Self-pay | Source: Ambulatory Visit | Attending: Urology

## 2020-04-08 ENCOUNTER — Encounter (HOSPITAL_COMMUNITY): Payer: Self-pay | Admitting: Urology

## 2020-04-08 DIAGNOSIS — C679 Malignant neoplasm of bladder, unspecified: Secondary | ICD-10-CM | POA: Diagnosis present

## 2020-04-08 HISTORY — PX: ROBOT ASSISTED LAPAROSCOPIC COMPLETE CYSTECT ILEAL CONDUIT: SHX5139

## 2020-04-08 HISTORY — PX: LYMPHADENECTOMY: SHX5960

## 2020-04-08 HISTORY — PX: CYSTOSCOPY WITH INJECTION: SHX1424

## 2020-04-08 LAB — HEMOGLOBIN AND HEMATOCRIT, BLOOD
HCT: 36.6 % (ref 36.0–46.0)
Hemoglobin: 12 g/dL (ref 12.0–15.0)

## 2020-04-08 LAB — SURGICAL PCR SCREEN
MRSA, PCR: NEGATIVE
Staphylococcus aureus: POSITIVE — AB

## 2020-04-08 SURGERY — CYSTECTOMY, ROBOT-ASSISTED, WITH ILEAL CONDUIT CREATION
Anesthesia: General

## 2020-04-08 MED ORDER — MEPERIDINE HCL 50 MG/ML IJ SOLN
6.2500 mg | INTRAMUSCULAR | Status: DC | PRN
Start: 2020-04-08 — End: 2020-04-08

## 2020-04-08 MED ORDER — DEXAMETHASONE SODIUM PHOSPHATE 10 MG/ML IJ SOLN
INTRAMUSCULAR | Status: AC
  Filled 2020-04-08: qty 1

## 2020-04-08 MED ORDER — ALVIMOPAN 12 MG PO CAPS
12.0000 mg | ORAL_CAPSULE | ORAL | Status: DC
  Filled 2020-04-08: qty 1

## 2020-04-08 MED ORDER — DEXTROSE-NACL 5-0.45 % IV SOLN: INTRAVENOUS | Status: DC

## 2020-04-08 MED ORDER — ONDANSETRON HCL 4 MG/2ML IJ SOLN: 4.0000 mg | INTRAMUSCULAR | Status: DC | PRN

## 2020-04-08 MED ORDER — LACTATED RINGERS IV SOLN: INTRAVENOUS | Status: DC | PRN

## 2020-04-08 MED ORDER — MUPIROCIN 2 % EX OINT
1.0000 "application " | TOPICAL_OINTMENT | Freq: Two times a day (BID) | CUTANEOUS | Status: AC
  Administered 2020-04-08 – 2020-04-12 (×9): 1 via NASAL
  Filled 2020-04-08 (×2): qty 22

## 2020-04-08 MED ORDER — CHLORHEXIDINE GLUCONATE CLOTH 2 % EX PADS
6.0000 | MEDICATED_PAD | Freq: Every day | CUTANEOUS | Status: AC
  Administered 2020-04-09 – 2020-04-12 (×4): 6 via TOPICAL

## 2020-04-08 MED ORDER — ALBUMIN HUMAN 5 % IV SOLN: INTRAVENOUS | Status: DC | PRN

## 2020-04-08 MED ORDER — WATER FOR IRRIGATION, STERILE IR SOLN
Status: DC | PRN
  Administered 2020-04-08: 1000 mL

## 2020-04-08 MED ORDER — DEXAMETHASONE SODIUM PHOSPHATE 10 MG/ML IJ SOLN
INTRAMUSCULAR | Status: DC | PRN
  Administered 2020-04-08: 10 mg via INTRAVENOUS

## 2020-04-08 MED ORDER — SUGAMMADEX SODIUM 200 MG/2ML IV SOLN
INTRAVENOUS | Status: DC | PRN
  Administered 2020-04-08: 150 mg via INTRAVENOUS

## 2020-04-08 MED ORDER — SODIUM CHLORIDE 0.9 % IV SOLN: INTRAVENOUS | Status: DC

## 2020-04-08 MED ORDER — STERILE WATER FOR IRRIGATION IR SOLN
Status: DC | PRN
  Administered 2020-04-08: 3000 mL

## 2020-04-08 MED ORDER — SODIUM CHLORIDE (PF) 0.9 % IJ SOLN
INTRAMUSCULAR | Status: DC | PRN
  Administered 2020-04-08: 20 mL

## 2020-04-08 MED ORDER — LACTATED RINGERS IV SOLN
INTRAVENOUS | Status: DC
  Administered 2020-04-08: 1000 mL via INTRAVENOUS

## 2020-04-08 MED ORDER — KETAMINE HCL 10 MG/ML IJ SOLN
INTRAMUSCULAR | Status: DC | PRN
  Administered 2020-04-08: 20 mg via INTRAVENOUS
  Administered 2020-04-08 (×2): 10 mg via INTRAVENOUS

## 2020-04-08 MED ORDER — HYDROMORPHONE HCL 1 MG/ML IJ SOLN
INTRAMUSCULAR | Status: DC | PRN
  Administered 2020-04-08 (×2): .5 mg via INTRAVENOUS

## 2020-04-08 MED ORDER — LIDOCAINE 2% (20 MG/ML) 5 ML SYRINGE
INTRAMUSCULAR | Status: AC
  Filled 2020-04-08: qty 5

## 2020-04-08 MED ORDER — LACTATED RINGERS IR SOLN
Status: DC | PRN
  Administered 2020-04-08: 1000 mL

## 2020-04-08 MED ORDER — DIPHENHYDRAMINE HCL 50 MG/ML IJ SOLN: 12.5000 mg | Freq: Four times a day (QID) | INTRAMUSCULAR | Status: DC | PRN

## 2020-04-08 MED ORDER — METRONIDAZOLE 500 MG PO TABS
500.0000 mg | ORAL_TABLET | ORAL | Status: DC
  Administered 2020-04-08: 500 mg via ORAL
  Filled 2020-04-08: qty 1

## 2020-04-08 MED ORDER — SENNOSIDES-DOCUSATE SODIUM 8.6-50 MG PO TABS
2.0000 | ORAL_TABLET | Freq: Every day | ORAL | Status: DC
  Administered 2020-04-08 – 2020-04-12 (×5): 2 via ORAL
  Filled 2020-04-08 (×5): qty 2

## 2020-04-08 MED ORDER — LIDOCAINE 2% (20 MG/ML) 5 ML SYRINGE
INTRAMUSCULAR | Status: DC | PRN
  Administered 2020-04-08: 60 mg via INTRAVENOUS
  Administered 2020-04-08: 40 mg via INTRAVENOUS

## 2020-04-08 MED ORDER — DIPHENHYDRAMINE HCL 12.5 MG/5ML PO ELIX: 12.5000 mg | ORAL_SOLUTION | Freq: Four times a day (QID) | ORAL | Status: DC | PRN

## 2020-04-08 MED ORDER — PIPERACILLIN-TAZOBACTAM 3.375 G IVPB: 3.3750 g | INTRAVENOUS | Status: DC

## 2020-04-08 MED ORDER — KETAMINE HCL 10 MG/ML IJ SOLN
INTRAMUSCULAR | Status: AC
  Filled 2020-04-08: qty 1

## 2020-04-08 MED ORDER — FENTANYL CITRATE (PF) 100 MCG/2ML IJ SOLN
INTRAMUSCULAR | Status: AC
  Filled 2020-04-08: qty 2

## 2020-04-08 MED ORDER — MIDAZOLAM HCL 5 MG/5ML IJ SOLN
INTRAMUSCULAR | Status: DC | PRN
  Administered 2020-04-08: 2 mg via INTRAVENOUS

## 2020-04-08 MED ORDER — HYDROMORPHONE HCL 1 MG/ML IJ SOLN
0.2500 mg | INTRAMUSCULAR | Status: DC | PRN
Start: 2020-04-08 — End: 2020-04-08
  Administered 2020-04-08 (×3): 0.5 mg via INTRAVENOUS

## 2020-04-08 MED ORDER — ACETAMINOPHEN 10 MG/ML IV SOLN
1000.0000 mg | Freq: Four times a day (QID) | INTRAVENOUS | Status: AC
  Administered 2020-04-08 – 2020-04-09 (×4): 1000 mg via INTRAVENOUS
  Filled 2020-04-08 (×4): qty 100

## 2020-04-08 MED ORDER — HYDROMORPHONE HCL 1 MG/ML IJ SOLN
INTRAMUSCULAR | Status: AC
  Filled 2020-04-08: qty 1

## 2020-04-08 MED ORDER — ROCURONIUM BROMIDE 10 MG/ML (PF) SYRINGE
PREFILLED_SYRINGE | INTRAVENOUS | Status: DC | PRN
  Administered 2020-04-08: 50 mg via INTRAVENOUS
  Administered 2020-04-08: 10 mg via INTRAVENOUS
  Administered 2020-04-08: 50 mg via INTRAVENOUS
  Administered 2020-04-08: 30 mg via INTRAVENOUS

## 2020-04-08 MED ORDER — OXYCODONE HCL 5 MG PO TABS
5.0000 mg | ORAL_TABLET | ORAL | Status: DC | PRN
  Administered 2020-04-08 – 2020-04-10 (×5): 5 mg via ORAL
  Filled 2020-04-08 (×6): qty 1

## 2020-04-08 MED ORDER — PIPERACILLIN-TAZOBACTAM 3.375 G IVPB
3.3750 g | Freq: Three times a day (TID) | INTRAVENOUS | Status: DC
  Administered 2020-04-08 – 2020-04-13 (×14): 3.375 g via INTRAVENOUS
  Filled 2020-04-08 (×14): qty 50

## 2020-04-08 MED ORDER — SODIUM CHLORIDE (PF) 0.9 % IJ SOLN
INTRAMUSCULAR | Status: AC
  Filled 2020-04-08: qty 20

## 2020-04-08 MED ORDER — PROPOFOL 10 MG/ML IV BOLUS
INTRAVENOUS | Status: AC
  Filled 2020-04-08: qty 20

## 2020-04-08 MED ORDER — PREDNISONE 5 MG PO TABS
5.0000 mg | ORAL_TABLET | Freq: Every day | ORAL | Status: DC
  Administered 2020-04-09 – 2020-04-13 (×5): 5 mg via ORAL
  Filled 2020-04-08 (×5): qty 1

## 2020-04-08 MED ORDER — ONDANSETRON HCL 4 MG/2ML IJ SOLN
INTRAMUSCULAR | Status: AC
  Filled 2020-04-08: qty 2

## 2020-04-08 MED ORDER — PHENYLEPHRINE 40 MCG/ML (10ML) SYRINGE FOR IV PUSH (FOR BLOOD PRESSURE SUPPORT)
PREFILLED_SYRINGE | INTRAVENOUS | Status: DC | PRN
  Administered 2020-04-08 (×3): 80 ug via INTRAVENOUS
  Administered 2020-04-08: 120 ug via INTRAVENOUS

## 2020-04-08 MED ORDER — MIDAZOLAM HCL 2 MG/2ML IJ SOLN
INTRAMUSCULAR | Status: AC
  Filled 2020-04-08: qty 2

## 2020-04-08 MED ORDER — HYDROMORPHONE HCL 1 MG/ML IJ SOLN
0.5000 mg | INTRAMUSCULAR | Status: DC | PRN
  Administered 2020-04-08 – 2020-04-09 (×2): 1 mg via INTRAVENOUS
  Administered 2020-04-09: 0.5 mg via INTRAVENOUS
  Administered 2020-04-09: 1 mg via INTRAVENOUS
  Filled 2020-04-08 (×4): qty 1

## 2020-04-08 MED ORDER — HYDROMORPHONE HCL 2 MG/ML IJ SOLN
INTRAMUSCULAR | Status: AC
  Filled 2020-04-08: qty 1

## 2020-04-08 MED ORDER — BUPIVACAINE LIPOSOME 1.3 % IJ SUSP
20.0000 mL | Freq: Once | INTRAMUSCULAR | Status: AC
  Administered 2020-04-08: 20 mL
  Filled 2020-04-08: qty 20

## 2020-04-08 MED ORDER — NEOMYCIN SULFATE 500 MG PO TABS
1000.0000 mg | ORAL_TABLET | ORAL | Status: DC
  Administered 2020-04-08: 1000 mg via ORAL
  Filled 2020-04-08 (×2): qty 2

## 2020-04-08 MED ORDER — AMISULPRIDE (ANTIEMETIC) 5 MG/2ML IV SOLN: 10.0000 mg | Freq: Once | INTRAVENOUS | Status: DC | PRN

## 2020-04-08 MED ORDER — PROPOFOL 10 MG/ML IV BOLUS
INTRAVENOUS | Status: DC | PRN
  Administered 2020-04-08: 100 mg via INTRAVENOUS

## 2020-04-08 MED ORDER — ALVIMOPAN 12 MG PO CAPS
12.0000 mg | ORAL_CAPSULE | Freq: Two times a day (BID) | ORAL | Status: DC
  Administered 2020-04-09 – 2020-04-10 (×4): 12 mg via ORAL
  Filled 2020-04-08 (×4): qty 1

## 2020-04-08 MED ORDER — FENTANYL CITRATE (PF) 250 MCG/5ML IJ SOLN
INTRAMUSCULAR | Status: DC | PRN
  Administered 2020-04-08 (×2): 50 ug via INTRAVENOUS

## 2020-04-08 SURGICAL SUPPLY — 98 items
APPLICATOR COTTON TIP 6 STRL (MISCELLANEOUS) ×2 IMPLANT
APPLICATOR COTTON TIP 6IN STRL (MISCELLANEOUS) ×3
APPLICATOR SURGIFLO ENDO (HEMOSTASIS) IMPLANT
BAG LAPAROSCOPIC 12 15 PORT 16 (BASKET) ×2 IMPLANT
BAG RETRIEVAL 12/15 (BASKET) ×3
BAG URO CATCHER STRL LF (MISCELLANEOUS) ×3 IMPLANT
BENZOIN TINCTURE PRP APPL 2/3 (GAUZE/BANDAGES/DRESSINGS) IMPLANT
BLADE SURG SZ10 CARB STEEL (BLADE) IMPLANT
CATH SILICONE 5CC 18FR (INSTRUMENTS) ×3 IMPLANT
CELLS DAT CNTRL 66122 CELL SVR (MISCELLANEOUS) ×2 IMPLANT
CHLORAPREP W/TINT 26 (MISCELLANEOUS) ×3 IMPLANT
CLIP VESOLOCK LG 6/CT PURPLE (CLIP) ×6 IMPLANT
CLIP VESOLOCK MED LG 6/CT (CLIP) ×3 IMPLANT
CLIP VESOLOCK XL 6/CT (CLIP) ×9 IMPLANT
CLOTH BEACON ORANGE TIMEOUT ST (SAFETY) ×3 IMPLANT
CNTNR URN SCR LID CUP LEK RST (MISCELLANEOUS) ×2 IMPLANT
CONT SPEC 4OZ STRL OR WHT (MISCELLANEOUS) ×3
COVER SURGICAL LIGHT HANDLE (MISCELLANEOUS) ×3 IMPLANT
COVER TIP SHEARS 8 DVNC (MISCELLANEOUS) ×2 IMPLANT
COVER TIP SHEARS 8MM DA VINCI (MISCELLANEOUS) ×1
COVER WAND RF STERILE (DRAPES) IMPLANT
DECANTER SPIKE VIAL GLASS SM (MISCELLANEOUS) ×3 IMPLANT
DERMABOND ADVANCED (GAUZE/BANDAGES/DRESSINGS) ×1
DERMABOND ADVANCED .7 DNX12 (GAUZE/BANDAGES/DRESSINGS) ×2 IMPLANT
DRAIN CHANNEL RND F F (WOUND CARE) IMPLANT
DRAIN PENROSE 0.5X18 (DRAIN) IMPLANT
DRAPE ARM DVNC X/XI (DISPOSABLE) ×8 IMPLANT
DRAPE COLUMN DVNC XI (DISPOSABLE) ×2 IMPLANT
DRAPE DA VINCI XI ARM (DISPOSABLE) ×4
DRAPE DA VINCI XI COLUMN (DISPOSABLE) ×1
DRAPE SURG IRRIG POUCH 19X23 (DRAPES) ×3 IMPLANT
DRSG TEGADERM 4X4.75 (GAUZE/BANDAGES/DRESSINGS) IMPLANT
ELECT PENCIL ROCKER SW 15FT (MISCELLANEOUS) ×3 IMPLANT
ELECT REM PT RETURN 15FT ADLT (MISCELLANEOUS) ×3 IMPLANT
GAUZE 4X4 16PLY RFD (DISPOSABLE) IMPLANT
GLOVE SURG ENC MOIS LTX SZ6.5 (GLOVE) ×6 IMPLANT
GLOVE SURG ENC TEXT LTX SZ7.5 (GLOVE) ×9 IMPLANT
GLOVE SURG UNDER POLY LF SZ7.5 (GLOVE) IMPLANT
GOWN STRL REUS W/TWL LRG LVL3 (GOWN DISPOSABLE) ×15 IMPLANT
IRRIG SUCT STRYKERFLOW 2 WTIP (MISCELLANEOUS) ×3
IRRIGATION SUCT STRKRFLW 2 WTP (MISCELLANEOUS) ×2 IMPLANT
KIT PROCEDURE DA VINCI SI (MISCELLANEOUS)
KIT PROCEDURE DVNC SI (MISCELLANEOUS) IMPLANT
KIT TURNOVER KIT A (KITS) ×3 IMPLANT
LOOP VESSEL MAXI BLUE (MISCELLANEOUS) ×3 IMPLANT
MANIFOLD NEPTUNE II (INSTRUMENTS) ×3 IMPLANT
NEEDLE ASPIRATION 22 (NEEDLE) ×3 IMPLANT
NEEDLE INSUFFLATION 14GA 120MM (NEEDLE) ×3 IMPLANT
PACK CYSTO (CUSTOM PROCEDURE TRAY) ×3 IMPLANT
PACK ROBOT UROLOGY CUSTOM (CUSTOM PROCEDURE TRAY) ×3 IMPLANT
PAD POSITIONING PINK XL (MISCELLANEOUS) ×3 IMPLANT
PORT ACCESS TROCAR AIRSEAL 12 (TROCAR) ×2 IMPLANT
PORT ACCESS TROCAR AIRSEAL 5M (TROCAR) ×1
RELOAD STAPLER GREEN 60MM (STAPLE) ×6 IMPLANT
RELOAD STAPLER WHITE 60MM (STAPLE) ×10 IMPLANT
RETRACTOR LONRSTAR 16.6X16.6CM (MISCELLANEOUS) IMPLANT
RETRACTOR STAY HOOK 5MM (MISCELLANEOUS) IMPLANT
RETRACTOR STER APS 16.6X16.6CM (MISCELLANEOUS)
RTRCTR WOUND ALEXIS 18CM MED (MISCELLANEOUS) ×3
SEAL CANN UNIV 5-8 DVNC XI (MISCELLANEOUS) ×8 IMPLANT
SEAL XI 5MM-8MM UNIVERSAL (MISCELLANEOUS) ×4
SET TRI-LUMEN FLTR TB AIRSEAL (TUBING) ×3 IMPLANT
SOLUTION ELECTROLUBE (MISCELLANEOUS) ×3 IMPLANT
SPONGE LAP 18X18 RF (DISPOSABLE) ×6 IMPLANT
SPONGE LAP 4X18 RFD (DISPOSABLE) ×3 IMPLANT
STAPLER ECHELON LONG 60 440 (INSTRUMENTS) ×3 IMPLANT
STAPLER RELOAD GREEN 60MM (STAPLE) ×9
STAPLER RELOAD WHITE 60MM (STAPLE) ×15
STENT SET URETHERAL LEFT 7FR (STENTS) ×3 IMPLANT
STENT SET URETHERAL RIGHT 7FR (STENTS) ×3 IMPLANT
SURGIFLO W/THROMBIN 8M KIT (HEMOSTASIS) IMPLANT
SUT CHROMIC 4 0 RB 1X27 (SUTURE) ×3 IMPLANT
SUT ETHILON 3 0 PS 1 (SUTURE) ×3 IMPLANT
SUT MNCRL AB 4-0 PS2 18 (SUTURE) ×6 IMPLANT
SUT PDS AB 0 CTX 36 PDP370T (SUTURE) ×9 IMPLANT
SUT SILK 3 0 SH 30 (SUTURE) IMPLANT
SUT SILK 3 0 SH CR/8 (SUTURE) ×3 IMPLANT
SUT V-LOC BARB 180 2/0GR6 GS22 (SUTURE) ×6
SUT VIC AB 2-0 CT1 27 (SUTURE)
SUT VIC AB 2-0 CT1 27XBRD (SUTURE) IMPLANT
SUT VIC AB 2-0 SH 18 (SUTURE) IMPLANT
SUT VIC AB 2-0 UR5 27 (SUTURE) ×12 IMPLANT
SUT VIC AB 3-0 SH 27 (SUTURE) ×18
SUT VIC AB 3-0 SH 27X BRD (SUTURE) ×4 IMPLANT
SUT VIC AB 3-0 SH 27XBRD (SUTURE) ×8 IMPLANT
SUT VIC AB 4-0 RB1 27 (SUTURE) ×12
SUT VIC AB 4-0 RB1 27XBRD (SUTURE) ×8 IMPLANT
SUT VLOC BARB 180 ABS3/0GR12 (SUTURE) ×3
SUTURE V-LC BRB 180 2/0GR6GS22 (SUTURE) ×4 IMPLANT
SUTURE VLOC BRB 180 ABS3/0GR12 (SUTURE) ×2 IMPLANT
SYR CONTROL 10ML LL (SYRINGE) IMPLANT
SYSTEM UROSTOMY GENTLE TOUCH (WOUND CARE) ×3 IMPLANT
TOWEL OR NON WOVEN STRL DISP B (DISPOSABLE) ×3 IMPLANT
TROCAR BLADELESS 15MM (ENDOMECHANICALS) ×3 IMPLANT
TROCAR XCEL NON-BLD 5MMX100MML (ENDOMECHANICALS) IMPLANT
TUBING CONNECTING 10 (TUBING) ×3 IMPLANT
WATER STERILE IRR 1000ML POUR (IV SOLUTION) ×3 IMPLANT
WATER STERILE IRR 3000ML UROMA (IV SOLUTION) ×3 IMPLANT

## 2020-04-08 NOTE — Anesthesia Preprocedure Evaluation (Addendum)
Anesthesia Evaluation  Patient identified by MRN, date of birth, ID band Patient awake    Reviewed: Allergy & Precautions, NPO status , Patient's Chart, lab work & pertinent test results  Airway Mallampati: II  TM Distance: >3 FB Neck ROM: Full    Dental  (+) Dental Advisory Given, Edentulous Upper, Edentulous Lower   Pulmonary neg pulmonary ROS, former smoker,    Pulmonary exam normal breath sounds clear to auscultation       Cardiovascular Normal cardiovascular exam Rhythm:Regular Rate:Normal     Neuro/Psych PSYCHIATRIC DISORDERS Anxiety negative neurological ROS     GI/Hepatic Neg liver ROS, GERD  Medicated,  Endo/Other  negative endocrine ROS  Renal/GU negative Renal ROS     Musculoskeletal  (+) Arthritis ,   Abdominal   Peds  Hematology negative hematology ROS (+)   Anesthesia Other Findings   Reproductive/Obstetrics                            Anesthesia Physical Anesthesia Plan  ASA: II  Anesthesia Plan: General   Post-op Pain Management:    Induction: Intravenous  PONV Risk Score and Plan: 4 or greater and Ondansetron, Dexamethasone, Midazolam, Scopolamine patch - Pre-op and Treatment may vary due to age or medical condition  Airway Management Planned: Oral ETT  Additional Equipment: None  Intra-op Plan:   Post-operative Plan: Extubation in OR  Informed Consent: I have reviewed the patients History and Physical, chart, labs and discussed the procedure including the risks, benefits and alternatives for the proposed anesthesia with the patient or authorized representative who has indicated his/her understanding and acceptance.     Dental advisory given  Plan Discussed with: CRNA  Anesthesia Plan Comments: (2 x PIV)        Anesthesia Quick Evaluation

## 2020-04-08 NOTE — Transfer of Care (Signed)
Immediate Anesthesia Transfer of Care Note  Patient: Sarah Rush  Procedure(s) Performed: XI ROBOTIC ASSISTED LAPAROSCOPIC COMPLETE CYSTECT , TOTAL ABDOMINAL HYSTERECTOMY, BILATERAL SALPINGO-OOPHORECTOMY AND ILEAL CONDUIT (N/A ) LYMPHADENECTOMY (Bilateral ) CYSTOSCOPY WITH INJECTION OF INDOCYANINE GREEN DYE (N/A )  Patient Location: PACU  Anesthesia Type:General  Level of Consciousness: awake, alert  and oriented  Airway & Oxygen Therapy: Patient Spontanous Breathing and Patient connected to nasal cannula oxygen  Post-op Assessment: Report given to RN  Post vital signs: Reviewed and stable  Last Vitals:  Vitals Value Taken Time  BP 152/77 04/08/20 1246  Temp    Pulse 100 04/08/20 1251  Resp 16 04/08/20 1251  SpO2 100 % 04/08/20 1251  Vitals shown include unvalidated device data.  Last Pain:  Vitals:   04/07/20 2058  TempSrc: Oral  PainSc:          Complications: No complications documented.

## 2020-04-08 NOTE — Anesthesia Postprocedure Evaluation (Signed)
Anesthesia Post Note  Patient: CHELCEY CAPUTO  Procedure(s) Performed: XI ROBOTIC ASSISTED LAPAROSCOPIC COMPLETE CYSTECT , TOTAL ABDOMINAL HYSTERECTOMY, BILATERAL SALPINGO-OOPHORECTOMY AND ILEAL CONDUIT (N/A ) LYMPHADENECTOMY (Bilateral ) CYSTOSCOPY WITH INJECTION OF INDOCYANINE GREEN DYE (N/A )     Patient location during evaluation: PACU Anesthesia Type: General Level of consciousness: sedated and patient cooperative Pain management: pain level controlled Vital Signs Assessment: post-procedure vital signs reviewed and stable Respiratory status: spontaneous breathing Cardiovascular status: stable Anesthetic complications: no   No complications documented.  Last Vitals:  Vitals:   04/08/20 1400 04/08/20 1429  BP: (!) 150/72 (!) 158/88  Pulse: 100 100  Resp: 15 16  Temp: 37.3 C 36.9 C  SpO2: 99% 100%    Last Pain:  Vitals:   04/08/20 1747  TempSrc:   PainSc: [place]

## 2020-04-08 NOTE — Brief Op Note (Signed)
04/07/2020 - 04/08/2020  12:23 PM  PATIENT:  Sarah Rush  60 y.o. female  PRE-OPERATIVE DIAGNOSIS:  BLADDER CANCER  POST-OPERATIVE DIAGNOSIS:  BLADDER CANCER  PROCEDURE:  Procedure(s) with comments: XI ROBOTIC ASSISTED LAPAROSCOPIC COMPLETE CYSTECT , TOTAL ABDOMINAL HYSTERECTOMY, BILATERAL SALPINGO-OOPHORECTOMY AND ILEAL CONDUIT (N/A) - 6 HRS LYMPHADENECTOMY (Bilateral) CYSTOSCOPY WITH INJECTION OF INDOCYANINE GREEN DYE (N/A)  SURGEON:  Surgeon(s) and Role:    Alexis Frock, MD - Primary  PHYSICIAN ASSISTANT:   ASSISTANTS: Debbrah Alar PA   ANESTHESIA:   local and general  EBL:  173mL   BLOOD ADMINISTERED:none  DRAINS: 1 - JP to bulb; 2 - urostomy to gravity with Rt (red) and Lt (blue) bander stents   LOCAL MEDICATIONS USED:  MARCAINE     SPECIMEN:  Source of Specimen:  1 - ureteral margins; 2 - pelvic lymph nodes; 3 - bladder + uterus + bilateral tubes / ovaries + anterior vaginal wall - en bloc  DISPOSITION OF SPECIMEN:  PATHOLOGY  COUNTS:  YES  TOURNIQUET:  * No tourniquets in log *  DICTATION: .Other Dictation: Dictation Number [id number]  PLAN OF CARE: Admit to inpatient   PATIENT DISPOSITION:  PACU - hemodynamically stable.   Delay start of Pharmacological VTE agent (>24hrs) due to surgical blood loss or risk of bleeding: yes

## 2020-04-08 NOTE — Progress Notes (Signed)
Pt returned to unit at this time.

## 2020-04-08 NOTE — Anesthesia Procedure Notes (Signed)
Procedure Name: Intubation Performed by: Quintasia Theroux A, CRNA Pre-anesthesia Checklist: Patient identified, Emergency Drugs available, Suction available and Patient being monitored Patient Re-evaluated:Patient Re-evaluated prior to induction Oxygen Delivery Method: Circle system utilized Preoxygenation: Pre-oxygenation with 100% oxygen Induction Type: IV induction Ventilation: Mask ventilation without difficulty and Oral airway inserted - appropriate to patient size Laryngoscope Size: Miller and 2 Grade View: Grade I Tube type: Oral Tube size: 7.0 mm Number of attempts: 1 Airway Equipment and Method: Stylet and Oral airway Placement Confirmation: ETT inserted through vocal cords under direct vision,  positive ETCO2 and breath sounds checked- equal and bilateral Secured at: 22 cm Tube secured with: Tape Dental Injury: Teeth and Oropharynx as per pre-operative assessment        

## 2020-04-08 NOTE — Discharge Instructions (Signed)

## 2020-04-09 ENCOUNTER — Encounter (HOSPITAL_COMMUNITY): Payer: Self-pay | Admitting: Urology

## 2020-04-09 LAB — BASIC METABOLIC PANEL
Anion gap: 9 (ref 5–15)
BUN: 9 mg/dL (ref 6–20)
CO2: 26 mmol/L (ref 22–32)
Calcium: 8.4 mg/dL — ABNORMAL LOW (ref 8.9–10.3)
Chloride: 102 mmol/L (ref 98–111)
Creatinine, Ser: 0.89 mg/dL (ref 0.44–1.00)
GFR, Estimated: >60 mL/min (ref >60)
Glucose, Bld: 139 mg/dL — ABNORMAL HIGH (ref 70–99)
Potassium: 3.6 mmol/L (ref 3.5–5.1)
Sodium: 137 mmol/L (ref 135–145)

## 2020-04-09 LAB — HEMOGLOBIN AND HEMATOCRIT, BLOOD
HCT: 34.1 % — ABNORMAL LOW (ref 36.0–46.0)
Hemoglobin: 10.9 g/dL — ABNORMAL LOW (ref 12.0–15.0)

## 2020-04-09 MED ORDER — GABAPENTIN 100 MG PO CAPS
100.0000 mg | ORAL_CAPSULE | Freq: Three times a day (TID) | ORAL | Status: DC
  Administered 2020-04-09 – 2020-04-12 (×10): 100 mg via ORAL
  Filled 2020-04-09 (×12): qty 1

## 2020-04-09 MED ORDER — POTASSIUM CHLORIDE 10 MEQ/100ML IV SOLN
10.0000 meq | INTRAVENOUS | Status: AC
  Administered 2020-04-09 (×4): 10 meq via INTRAVENOUS
  Filled 2020-04-09 (×4): qty 100

## 2020-04-09 MED ORDER — HEPARIN SODIUM (PORCINE) 5000 UNIT/ML IJ SOLN
5000.0000 [IU] | Freq: Three times a day (TID) | INTRAMUSCULAR | Status: AC
  Administered 2020-04-09 – 2020-04-10 (×6): 5000 [IU] via SUBCUTANEOUS
  Filled 2020-04-09 (×6): qty 1

## 2020-04-09 MED ORDER — ACETAMINOPHEN 500 MG PO TABS
1000.0000 mg | ORAL_TABLET | Freq: Four times a day (QID) | ORAL | Status: DC
  Administered 2020-04-10 – 2020-04-13 (×13): 1000 mg via ORAL
  Filled 2020-04-09 (×13): qty 2

## 2020-04-09 MED ORDER — LIDOCAINE 5 % EX PTCH
2.0000 | MEDICATED_PATCH | Freq: Every day | CUTANEOUS | Status: DC
  Administered 2020-04-09 – 2020-04-11 (×3): 2 via TRANSDERMAL
  Filled 2020-04-09 (×5): qty 2

## 2020-04-09 NOTE — Plan of Care (Signed)

## 2020-04-09 NOTE — Progress Notes (Addendum)
1 Day Post-Op Subjective: 1 - Refractory High Grade Bladder Cancer - Ta, CIS, T1 high grade cancer recurrent 11/2019 despite BCG full induction x2. Most recent TURBT 11/2019 by Dr. Erlene Quan. Single ureters bilaterally, still has uterus in situ. Restaging CT 12/2019 w/o distant or upper tract disease.   Today "Sarah Rush" is doing well. She denies nausea/emesis. Pain controlled. No flatus/BM. Ambulating. Great pink-tinged UOP via foley. 205cc SS JP drain output since placement.   Normal Cr. Fairly stable Hgb post-operatively.  Objective: Vital signs in last 24 hours: Temp:  [98.5 F (36.9 C)-99.2 F (37.3 C)] 98.6 F (37 C) (03/12 0619) Pulse Rate:  [81-100] 88 (03/12 0619) Resp:  [13-20] 14 (03/12 0619) BP: (109-158)/(61-88) 109/61 (03/12 0619) SpO2:  [93 %-100 %] 93 % (03/12 0619)  Intake/Output from previous day: 03/11 0701 - 03/12 0700 In: 2264 [I.V.:1800; IV Piggyback:464] Out: 1970 [Urine:1675; Drains:205; Blood:90] Intake/Output this shift: Total I/O In: 136.8 [IV Piggyback:136.8] Out: -   Physical Exam:  General: Alert and oriented CV: Regular rate Lungs: NWOB on RA Abdomen: Soft, nondistended, appropriately tender. Urostomy pink/healthy with 2 bander stents in place (right - red, left - blue), productive of pink-tinged urine. LLQ JP with SS output. Incisions intact and covered with dermabond; no signs of infection Ext: Warm and well-perfused   Lab Results: Recent Labs    04/07/20 1643 04/08/20 1315 04/09/20 0335  HGB 12.9 12.0 10.9*  HCT 39.4 36.6 34.1*   BMET Recent Labs    04/07/20 1643 04/09/20 0335  NA 138 137  K 3.4* 3.6  CL 102 102  CO2 26 26  GLUCOSE 94 139*  BUN 13 9  CREATININE 0.66 0.89  CALCIUM 9.2 8.4*     Studies/Results: No results found.  Assessment/Plan: 1 - Bladder Cancer - s/p robotic cystectomy, Cysto-ICG injection, sentinel + template pelvic node dissection, total abdominal hysterectomy, bilateral salpingo-oophorectomy, ilial  conduit diversion 04/08/20. Path pending.  Admitted 3/10 for stomal marking / bowel prep.  2 - Ileus - s/p planned bowel anastamosis at surgery 04/08/20. Received entereg peri-op. Initially NPO. Sips and chips POD 1. AROBF  3 - Disposition / Rehab - Independent in all ADL's at baseline. WOCN consulted during hospitalization for initial urostomy teaching. PT consulted, evaluation pending   - Advance to sips/chips. Continue 100cc mIVF  - Continue bowel regimen. Continue entereg until bowel movement  - Start SQH for DVT ppx - Daily labs  - Continue JP drain and ureteral stents - Continue Zosyn for positive pre-op Ucx  - PRN analgesics and anti-emetics - WOCN and PT consulted - IS, SCDs, encourage ambulation   LOS: 2 days   [name] 04/09/2020, 10:26 AM  I have seen and examined the patient and agree with the above assessment and plan.  Pain controlled. No nausea or emesis. No flatus or BM. Ambulating. Afebrile, hemodynamically appropriate UOP: 1.7L pink tinged via stents in ostomy bag JP: 220ml ss Abd soft, ND, ATTP; inc c/d/i; ostomy p/p with stents in place draining pink tinged urine  -Pain control prn -Sips/chips today -Entereg, bowel regimen -JP to bulb -Zosyn for pr-op urine cx -Reviewed labs, hgb appropriate at 10.9. Replete K. -OOB, amb, IS, SQH, PT, WOCN  [facility]  Pager: [phone]

## 2020-04-09 NOTE — Consult Note (Signed)
[place] Nurse ostomy consult note Consult received for ostomy teaching.  Patient is on the [facility] nursing follow up list.  First visit planned for Monday, 3/14.  [facility] nursing team will follow, and will remain available to this patient, the nursing, the surgical and medical teams.   Thanks, Maudie Flakes, MSN, RN, [place], Arther [facility]  Pager# [phone]

## 2020-04-10 LAB — HEMOGLOBIN AND HEMATOCRIT, BLOOD
HCT: 31.7 % — ABNORMAL LOW (ref 36.0–46.0)
Hemoglobin: 10.2 g/dL — ABNORMAL LOW (ref 12.0–15.0)

## 2020-04-10 LAB — BASIC METABOLIC PANEL
Anion gap: 6 (ref 5–15)
BUN: 7 mg/dL (ref 6–20)
CO2: 27 mmol/L (ref 22–32)
Calcium: 8.2 mg/dL — ABNORMAL LOW (ref 8.9–10.3)
Chloride: 104 mmol/L (ref 98–111)
Creatinine, Ser: 0.72 mg/dL (ref 0.44–1.00)
GFR, Estimated: >60 mL/min (ref >60)
Glucose, Bld: 98 mg/dL (ref 70–99)
Potassium: 3.4 mmol/L — ABNORMAL LOW (ref 3.5–5.1)
Sodium: 137 mmol/L (ref 135–145)

## 2020-04-10 MED ORDER — POTASSIUM CHLORIDE 10 MEQ/100ML IV SOLN
10.0000 meq | INTRAVENOUS | Status: AC
  Administered 2020-04-10 (×6): 10 meq via INTRAVENOUS
  Filled 2020-04-10 (×6): qty 100

## 2020-04-10 MED ORDER — ENOXAPARIN SODIUM 40 MG/0.4ML ~~LOC~~ SOLN
40.0000 mg | SUBCUTANEOUS | Status: DC
  Administered 2020-04-11 – 2020-04-13 (×3): 40 mg via SUBCUTANEOUS
  Filled 2020-04-10 (×3): qty 0.4

## 2020-04-10 NOTE — Plan of Care (Signed)

## 2020-04-10 NOTE — Progress Notes (Addendum)
2 Days Post-Op Subjective: 1 - Refractory High Grade Bladder Cancer - Ta, CIS, T1 high grade cancer recurrent 11/2019 despite BCG full induction x2. Most recent TURBT 11/2019 by Dr. Erlene Quan. Single ureters bilaterally, still has uterus in situ. Restaging CT 12/2019 w/o distant or upper tract disease.   Today "Sarah Rush" is doing well. She is tolerating sips and chips without nausea/emesis. Endorses passing flatus and had BM this morning. Pain controlled. Ambulating. Great pink-tinged UOP via foley. 175cc SS JP drain yesterday.   Normal Cr. Stable Hgb post-operatively.  Objective: Vital signs in last 24 hours: Temp:  [98.6 F (37 C)-99 F (37.2 C)] 99 F (37.2 C) (03/13 0623) Pulse Rate:  [76-88] 76 (03/13 0623) Resp:  [16-20] 20 (03/13 0623) BP: (112-121)/(62-64) 112/62 (03/13 0623) SpO2:  [94 %-95 %] 94 % (03/13 0623)  Intake/Output from previous day: 03/12 0701 - 03/13 0700 In: 1107.7 [I.V.:297.5; IV Piggyback:690.2] Out: 2620 [Urine:3200; Drains:175] Intake/Output this shift: Total I/O In: 788.9 [I.V.:682.2; IV Piggyback:106.8] Out: -   Physical Exam:  General: Alert and oriented CV: Regular rate Lungs: NWOB on RA Abdomen: Soft, nondistended, appropriately tender. Urostomy pink/healthy with 2 bander stents in place (right - red, left - blue), productive of pink-tinged urine. LLQ JP with SS output. Incisions intact and covered with dermabond; no signs of infection Ext: Warm and well-perfused   Lab Results: Recent Labs    04/08/20 1315 04/09/20 0335 04/10/20 0509  HGB 12.0 10.9* 10.2*  HCT 36.6 34.1* 31.7*   BMET Recent Labs    04/09/20 0335 04/10/20 0509  NA 137 137  K 3.6 3.4*  CL 102 104  CO2 26 27  GLUCOSE 139* 98  BUN 9 7  CREATININE 0.89 0.72  CALCIUM 8.4* 8.2*     Studies/Results: No results found.  Assessment/Plan: 1 - Bladder Cancer - s/p robotic cystectomy, Cysto-ICG injection, sentinel + template pelvic node dissection, total abdominal  hysterectomy, bilateral salpingo-oophorectomy, ilial conduit diversion 04/08/20. Path pending.  Admitted 3/10 for stomal marking / bowel prep.  2 - Ileus - s/p planned bowel anastamosis at surgery 04/08/20. Received entereg peri-op. Initially NPO. Sips and chips POD 1. CLD POD 2. Anticipate advancing to regular diet tomorrow given that she is already passing flatus/had BM.   3 - Disposition / Rehab - Independent in all ADL's at baseline. WOCN consulted during hospitalization for initial urostomy teaching. PT consulted, evaluation pending   - Advance to CLD. Decrease to 50cc mIVF  - Continue bowel regimen. Continue entereg until bowel movement  - Switch to lovenox from [facility] DVT ppx tomorrow given stable Hgb - Daily labs  - Continue JP drain and ureteral stents - Continue Zosyn for positive pre-op Ucx  - PRN analgesics and anti-emetics - WOCN and PT consulted - IS, SCDs, encourage ambulation   LOS: 3 days   [name] 04/10/2020, 11:07 AM  I have seen and examined the patient and agree with the above assessment and plan.  Pain controlled, no nausea or emesis. Passing flatus and said had a BM early this AM.  Afebrile, hemodynamically appropriate UOP: 3.2L JP: 126ml ss Abd soft, ND, ATTP; inc c/d/i; urostomy p/p with stents in place with pink tinged urine  -Pain control prn -Advance to clears -Wean MIVF -Entereg, bowel regimen -Zosyn for preop positive UCx -Hgb stable at 10.2 -Cr 0.72.  -Replete K -OOB, amb, IS, SQH, PT, WOCN  [facility]  Pager: [phone]

## 2020-04-10 NOTE — Evaluation (Signed)
Physical Therapy Evaluation Patient Details Name: Sarah Rush MRN: [id number] DOB: 1960/02/18 Today's Date: 04/10/2020   History of Present Illness  Pt s/p xi robotic assisted laparoscopic complete cystect, total abdominal hysterectomy, and bilateral salpingo-oophorectomy 2* bladder CA 04/09/19.  Pt with hx of RA  Clinical Impression  Pt admitted as above and presenting with functional mobility limitations 2* mild ambulatory balance deficits, decreased endurance and post op abdominal pain.  Pt very motivated and should progress to dc home with family assist.    Follow Up Recommendations No PT follow up    Equipment Recommendations  None recommended by PT    Recommendations for Other Services       Precautions / Restrictions Precautions Precautions: Fall Precaution Comments: JP drain on L Restrictions Weight Bearing Restrictions: No      Mobility  Bed Mobility Overal bed mobility: Needs Assistance Bed Mobility: Supine to Sit     Supine to sit: Min guard     General bed mobility comments: use of bedrail and cues for log roll technique    Transfers Overall transfer level: Needs assistance Equipment used: None Transfers: Sit to/from Stand Sit to Stand: Min guard         General transfer comment: min steady assist only  Ambulation/Gait Ambulation/Gait assistance: Min guard Gait Distance (Feet): 800 Feet Assistive device: None Gait Pattern/deviations: Step-through pattern;Decreased step length - right;Decreased step length - left;Shuffle;Wide base of support Gait velocity: moderate pace   General Gait Details: mildly widened BOS and decreased knee flexion with LE advance but min instability and no LOB noted  Stairs            Wheelchair Mobility    Modified Rankin (Stroke Patients Only)       Balance Overall balance assessment: Mild deficits observed, not formally tested                                            Pertinent Vitals/Pain Pain Assessment: 0-10 Pain Score: 2  Pain Location: abdomen Pain Descriptors / Indicators: Sore Pain Intervention(s): Monitored during session;Limited activity within patient's tolerance    Home Living Family/patient expects to be discharged to:: Private residence Living Arrangements: Spouse/significant other Available Help at Discharge: Family Type of Home: House Home Access: Stairs to enter   Technical brewer of Steps: 2 Home Layout: One level Home Equipment: Environmental consultant - 2 wheels;Cane - single point;Bedside commode Additional Comments: Pt reports all equipment from husbands prior usage    Prior Function Level of Independence: Independent         Comments: Works full time as Probation officer        Extremity/Trunk Assessment   Upper Extremity Assessment Upper Extremity Assessment: Overall WFL for tasks assessed    Lower Extremity Assessment Lower Extremity Assessment: Overall WFL for tasks assessed    Cervical / Trunk Assessment Cervical / Trunk Assessment: Normal  Communication   Communication: No difficulties  Cognition Arousal/Alertness: Awake/alert Behavior During Therapy: WFL for tasks assessed/performed Overall Cognitive Status: Within Functional Limits for tasks assessed                                        General Comments      Exercises     Assessment/Plan  PT Assessment Patient needs continued PT services  PT Problem List Decreased balance;Decreased mobility;Decreased activity tolerance       PT Treatment Interventions DME instruction;Gait training;Functional mobility training;Therapeutic activities;Patient/family education    PT Goals (Current goals can be found in the Care Plan section)  Acute Rehab PT Goals Patient Stated Goal: Regain IND PT Goal Formulation: With patient Time For Goal Achievement: 04/24/20 Potential to Achieve Goals: Good    Frequency Min  3X/week   Barriers to discharge        Co-evaluation               AM-PAC PT "6 Clicks" Mobility  Outcome Measure Help needed turning from your back to your side while in a flat bed without using bedrails?: A Little Help needed moving from lying on your back to sitting on the side of a flat bed without using bedrails?: A Little Help needed moving to and from a bed to a chair (including a wheelchair)?: A Little Help needed standing up from a chair using your arms (e.g., wheelchair or bedside chair)?: A Little Help needed to walk in hospital room?: A Little Help needed climbing 3-5 steps with a railing? : A Little 6 Click Score: 18    End of Session   Activity Tolerance: Patient tolerated treatment well Patient left: in chair;with call bell/phone within reach;with nursing/sitter in room Nurse Communication: Mobility status PT Visit Diagnosis: Difficulty in walking, not elsewhere classified (R26.2)    Time: 5364-6803 PT Time Calculation (min) (ACUTE ONLY): 21 min   Charges:   PT Evaluation $PT Eval Low Complexity: 1 Low          [place] Pager [phone] Office [phone]   Macai Sisneros 04/10/2020, 3:25 PM

## 2020-04-11 LAB — BPAM RBC
Blood Product Expiration Date: 202204082359
Blood Product Expiration Date: 202204082359
Unit Type and Rh: 5100
Unit Type and Rh: 5100

## 2020-04-11 LAB — BASIC METABOLIC PANEL
Anion gap: 6 (ref 5–15)
BUN: 7 mg/dL (ref 6–20)
CO2: 25 mmol/L (ref 22–32)
Calcium: 8.7 mg/dL — ABNORMAL LOW (ref 8.9–10.3)
Chloride: 107 mmol/L (ref 98–111)
Creatinine, Ser: 0.71 mg/dL (ref 0.44–1.00)
GFR, Estimated: >60 mL/min (ref >60)
Glucose, Bld: 95 mg/dL (ref 70–99)
Potassium: 3.7 mmol/L (ref 3.5–5.1)
Sodium: 138 mmol/L (ref 135–145)

## 2020-04-11 LAB — HEMOGLOBIN AND HEMATOCRIT, BLOOD
HCT: 32.4 % — ABNORMAL LOW (ref 36.0–46.0)
Hemoglobin: 10.5 g/dL — ABNORMAL LOW (ref 12.0–15.0)

## 2020-04-11 LAB — TYPE AND SCREEN
ABO/RH(D): O POS
Antibody Screen: NEGATIVE
Unit division: 0
Unit division: 0

## 2020-04-11 LAB — SURGICAL PATHOLOGY

## 2020-04-11 NOTE — Consult Note (Addendum)
[facility] Nurse ostomy consult note Pt had urostomy surgery performed on 3/11 Husband at the bedside for teaching demonstration.  Pt participated and asked appropriate questions.  Stoma type/location: Stoma is red and viable, current flat pouch was leaking behind the barrier. Stomal assessment/size: 1 1/4 inches, flush with skin level Peristomal assessment: red moist partial thickness medical adhesive related skin damage from tape on the boarder edge at 10:00-12:00; applied foam dressing to protect from further injury Output: Mod amt cloudy yellow urine; 2 stints in place   Ostomy pouching: 1pc.  Education provided:  Demonstrated pouch change, using one piece convex pouch (# [id number]) and barrier ring (# [id number]) to attempt to maintain a seal.  Pt was able to open and close to empty and attach and remove bedside drainage bag.  Discussed ordering supplies and pouching routines.  2 sets of barrier rings and pouches at the bedside.  [facility] team will perform another teaching session on Wed at 10:00 as requested by the patient and her husband.  Enrolled patient in [facility] program: Yes Julien Girt MSN, RN, [place], [place], [place]

## 2020-04-11 NOTE — Progress Notes (Signed)
3 Days Post-Op   Subjective/Chief Complaint:  1 - Refractory High Grade Bladder Cancer -  S/p robotic cystectomy + TAH/BSA + ICG sentinal/template node dissection + conduit diversion 04/08/20. Path pending. Admitted 3/10 for bowel prep and stomal marking.  2 - Ileus - NPO initially, ice chips POD 1. Flatus POD 2 and advanced to clears, fulls POD 3 and saline locked.   3- Disposition / Rehab - independent in ADL's at baseline. Ostomy RN working wti in house for [employer] PT eval 3/13 feels no dedicated rehab needs.    Today "Sarah Rush" is doing well. Tolerating clears with flatus. Ambulating few timesr per day.     Objective: Vital signs in last 24 hours: Temp:  [98.2 F (36.8 C)-98.6 F (37 C)] 98.6 F (37 C) (03/14 0437) Pulse Rate:  [70-82] 72 (03/14 0437) Resp:  [16-20] 16 (03/14 0437) BP: (121-135)/(60-71) 121/60 (03/14 0437) SpO2:  [96 %-99 %] 99 % (03/14 0437) Last BM Date: 04/11/20  Intake/Output from previous day: 03/13 0701 - 03/14 0700 In: 2333.3 [P.O.:240; I.V.:1479.7; IV Piggyback:613.6] Out: 3710.8 [Urine:3525; Drains:185.8] Intake/Output this shift: No intake/output data recorded.  General: Alert and oriented. Very pleasant.  CV: Regular rate Lungs: NWOB on RA Abdomen: Soft, nondistended, appropriately tender. Urostomy pink/healthy with 2 bander stents in place (right - red, left - blue), productive of pink-tinged urine. LLQ JP with SS output. Incisions intact and covered with dermabond; no signs of infection Ext: Warm and well-perfused. No c/c/e.   Lab Results:  Recent Labs    04/10/20 0509 04/11/20 0402  HGB 10.2* 10.5*  HCT 31.7* 32.4*   BMET Recent Labs    04/10/20 0509 04/11/20 0402  NA 137 138  K 3.4* 3.7  CL 104 107  CO2 27 25  GLUCOSE 98 95  BUN 7 7  CREATININE 0.72 0.71  CALCIUM 8.2* 8.7*   PT/INR No results for input(s): LABPROT, INR in the last 72 hours. ABG No results for input(s): PHART, HCO3 in the last 72  hours.  Invalid input(s): PCO2, PO2  Studies/Results: No results found.  Anti-infectives: Anti-infectives (From admission, onward)   Start     Dose/Rate Route Frequency Ordered Stop   04/08/20 1800  piperacillin-tazobactam (ZOSYN) IVPB 3.375 g        3.375 g 12.5 mL/hr over 240 Minutes Intravenous Every 8 hours 04/08/20 1635     04/08/20 1633  piperacillin-tazobactam (ZOSYN) IVPB 3.375 g  Status:  Discontinued        3.375 g 100 mL/hr over 30 Minutes Intravenous 30 min pre-op 04/08/20 1633 04/08/20 1635   04/08/20 0700  piperacillin-tazobactam (ZOSYN) IVPB 3.375 g        3.375 g 100 mL/hr over 30 Minutes Intravenous 30 min pre-op 04/07/20 1610 04/08/20 0730   04/08/20 0400  neomycin (MYCIFRADIN) tablet 1,000 mg  Status:  Discontinued        1,000 mg Oral Every 4 hours 04/08/20 0205 04/08/20 1159   04/08/20 0205  metroNIDAZOLE (FLAGYL) tablet 500 mg  Status:  Discontinued        500 mg Oral Every 4 hours 04/08/20 0205 04/08/20 1059      Assessment/Plan: Doing very well POD 3. Saline lock, adv to fulls. Probable DC Wed 3/16 based on current progress.   Alexis Frock 04/11/2020

## 2020-04-11 NOTE — Op Note (Unsigned)
NAME: Sarah Rush, Sarah Rush MEDICAL RECORD NO: [id number] ACCOUNT NO: [id number] DATE OF BIRTH: Jul 04, 1960 FACILITY: [facility]ATION: [id number] PHYSICIAN: Alexis Frock, MD  Operative Report   DATE OF PROCEDURE: 04/08/2020  PREOPERATIVE DIAGNOSIS:  Refractory recurrent high-grade bladder cancer.  PROCEDURE PERFORMED: 1.  Cystoscopy with injection of ICG dye. 2.  Robotic-assisted laparoscopic radical cystectomy. 2.  Robotic total hysterectomy with bilateral salpingo-oophorectomy. 3.  Ileal conduit urinary diversion. 4.  Bilateral pelvic lymph node dissection.  ASSISTANT:  Debbrah Alar, PA  DRAINS: 1.  Jackson-Pratt drain to bulb suction. 2.  Right lower quadrant urostomy with a right (red) and left (blue) Bander stents.  ESTIMATED BLOOD LOSS:  100 mL  FINDINGS:  1.  Relatively small volume intraluminal cancer in the bladder on cystoscopy.  Several erythematous patches consistent with known carcinoma in situ. 2.  Right greater than left pelvic sentinel lymph nodes, denoted as such on pathology requisition. 3.  Somewhat thin and friable mesentery and bowel consistent with steroid use, not severe.  SPECIMENS:  1.  Right distal ureteral margin, frozen section negative. 2.  Final right distal ureteral margin. 3.  Left distal ureteral margin, frozen section negative. 4.  Final left distal ureteral margin. 5.  Right external iliac lymph nodes. 6.  Right obturator lymph nodes. 7.  Right common iliac lymph nodes. 8.  Aortic bifurcation lymph nodes. 9.  Left common iliac lymph nodes. 10.  Left external iliac lymph nodes. 11.  Left obturator lymph nodes. 12.  Left internal iliac lymph nodes. 13.  Bladder plus anterior vaginal wall plus uterus plus bilateral tubes and ovaries en bloc.  INDICATIONS:  The patient is a pleasant 60 year old lady with multiple year history of recurrent high-grade bladder cancer, which has fortunately been stage I or less.  She is status post BCG induction x2;  however, still has recurrent high grade cancer.   Fortunately, this has not been muscle invasive, so far.  She was referred for consideration of curative intent cystectomy.  This was discussed in detail,   this being guideline optimal therapy for recurrent high grade cancer, status post BCG versus therapies with immunotherapy and she wished to proceed with cystectomy with curative intent.  Informed consent was obtained and placed on medical record.  She was admitted yesterday for stomal marking, bowel prep and labs which were all favorable.  DESCRIPTION OF PROCEDURE:  The patient being identified, procedure being cystoscopy with ICG induction, robotic pelvic exenteration and conduit diversion was confirmed.  Procedure timeout was performed, intravenous antibiotics were administered, general  endotracheal anesthesia induced.  The patient was placed into a low lithotomy position.  A sterile field was created, prepped and draped the patient's vagina, introitus and proximal thighs using iodine and her infra-xiphoid abdomen using chlorhexidine  gluconate after she was further fastened to the operative table using 3-inch tape over foam padding across the supraxiphoid chest.  Her arms were tucked to her side with gel rolls.  Test of steep Trendelenburg positioning was performed and found to be  suitably positioned.  An LAVH type drape was applied.  Cystourethroscopy was performed using a 24-French injection scope set with 0-degree lens.  Inspection of urinary bladder revealed very small volume of papillary tumor mostly in the trigone region  close to the right ureteral orifice, but not involving it directly.  There were multiple patchy erythematous patches in the posterior wall consistent with known carcinoma in situ.  Next, 2 mL of indocyanine green dye was injected across 6 mucosal blebs  in the posterior wall and to trigone region for sentinel  lymphangiography and a new silicone type Foley catheter was placed  free to straight drain, 10 mL water in balloon.  Next, high flow, low pressure pneumoperitoneum was obtained using Veress  technique in the supraumbilical midline, having passed the aspiration and drop test.  An 8 mm robotic camera port was placed in the same location.  Laparoscopic examination of the peritoneal cavity revealed minimal adhesions, no visceral injury.   Additional ports were placed as follows:  Right paramedian 8 mm robotic port, right far lateral 12 mm AirSeal assistant port, right paramedian 15 mm assistant port site at the previously marked stomal site, left paramedian 8 mm robotic port, left far  lateral 8 mm robotic port.  Robot was docked and passed the electronic checks.  Initial attention was directed at left retroperitoneal dissection.  Incision was made lateral to the descending colon from the area of the iliac crossing superiorly for a  distance of approximately 10 cm and inferiorly coursing lateral to the left medial umbilical ligament.  This created a large retroperitoneal flap, which was rotated medially.  The ureter and gonadal vessels were clearly seen and as it coursed over the  iliac vessels.  The gonadal vessels were controlled using extra-large Hemoclip x2 proximal and 1 distal, and the ureter was marked with a vessel loop, dissected proximally for a distance of approximately 8 cm above the iliac crossing and then distally to  the ureterovesical junction, was doubly clipped and ligated with a proximal clip containing a dye tagged suture and the left ureter was tucked out of the true pelvis.  Left lateral bladder wall and lateral uterine dissection was performed, releasing the  lateral attachments from these structures.  This allowed excellent visualization of the left pelvic lymph node field.  Attention was directed to the left side, the pelvic lymphadenectomy.  First, the left external iliac group with the boundaries being  left external iliac artery, vein, pelvic  sidewall, obturator nerve.  Lymphostasis was achieved with cold clips, set aside, labeled left external iliac lymph nodes.  Next, left obturator group was dissected free with the boundaries being left external  iliac vessel, obturator nerve, set aside, labeled left obturator lymph nodes.  Next, left internal iliac group was dissected free, with the boundaries being iliac bifurcation, superior vesicle artery.  Lymphostasis achieved with cold clips, set  aside,  labeled left internal iliac lymph nodes and finally the left common iliac group was dissected free with the boundaries being the aortic bifurcation, iliac bifurcation.  Lymphostasis was similarly achieved with cold clips, set aside, labeled left common  iliac lymph nodes.  Attention was then directed at the right sided dissection.  The appendix was seen at the area of the cecum and the ileocecal junction was clearly seen and the ileum was traced proximally for a distance of approximately 14 cm to a loop  of distal ileum that appeared to have sufficient mesenteric mobility for conduit formation.  A tagged clip with silk suture was applied at the level of the epiploic fat and a clip distal to this to denote proximal and distal orientation.  Incision was  then made lateral to the ascending colon from the area of the cecum superiorly for a distance approximately 10 cm and then inferiorly coursing lateral to the right medial umbilical ligament.  A Y-shaped extension of this posterior peritoneal flap was  created coursing along the iliac vessel towards the area of the  aortic bifurcation.  This created a large posterior peritoneal flap, which was used to rotate the ascending colon and small bowel medially.  The gonadal vessels were encountered, doubly  clipped and ligated proximally, one clip distal.  Right ureter was seen as it was coursing over the iliac vessels, this was carefully mobilized with vessel loop, dissected proximally for a distance of  approximately 6 cm above the iliac crossing and  distally to the ureterovesical junction, was doubly clipped and ligated, the proximal clip containing a white tag suture.  Frozen section negative for carcinoma.  The right ureter was tucked out of the true pelvis.  Similarly, right lateral bladder and  ovarian uterine attachments were taken down away from the pelvic sidewall, which exposed lymphatic field on the right side and a template lymphadenectomy was performed of right pelvic lymph nodes using the same template as per the left.  There were  multiple sentinel lymph nodes on the right side.  These were denoted on pathology requisition.  Lymphostasis was excellent and the right obturator nerve was inspected following the maneuvers as per the left, found to be uninjured.  Additional lymphatic  tissue was taken from the area of the aortic bifurcation, labeled as such. This inherently developed a retroperitoneal window just anterior to the aorta but behind the descending colon and its vasculature and via this retroperitoneal window, the left  ureter was transposed to the right side and the right ureter, left ureter, terminal ileum tag sutures were placed into a single clip for later conduit formation.  Attention was directed at the posterior dissection.  The posterior peritoneal flap was  developed between the 2 lateral inferior incisions.  Sponge stick was placed in the posterior vaginal fornix and the dissection proceeded directly onto this into the vagina and dissection was carried down inferiorly, keeping a narrow strip of anterior  vaginal wall with the en bloc uterus and the bladder specimen, trying to keep the maximal portion in situ for later vaginal reconstruction.  This exposed the vascular pedicles of the uterus and bladder, which were carefully controlled using white load  stapler x2 each side, taking exquisite care to avoid staple lines in the area of vaginal mucosa.  Space of Retzius was then  developed between the medial umbilical ligaments.  This was carried inferiorly towards the area of the urethra.  Dorsal vein of  the clitoris was controlled using bipolar energy.  Using a combination of anterior and lateral dissection, the urethral meatus was completely excised en bloc with the bladder.  The in situ Foley catheter was doubly clipped, ligated, which allowed a  bucket handle of the entire en bloc specimen containing anterior vaginal wall, uterus, bilateral tubes, ovaries and bladder.  This was placed into an extra-large EndoCatch bag for later retrieval.  Attention was then directed at the vaginal  reconstruction.  A double arm 2-0 V-Loc suture was used in the 12 o'clock position of the prior area of urethral meatus and a clamshell type cuff was performed, reapproximating this to the 12 o'clock position of the posterior vaginal wall.  This created  2 separate suture lines which were then reapproximated using running V-Loc, which revealed an excellent apposition of the vaginal cuff.  Next, a vaginal exam was performed with indicator glove corroborating complete apposition of the vaginal cuff and  digital rectal exam was performed using indicator glove corroborating no evidence of rectal injury.  At this point, sponge and needle counts were correct.  Hemostasis was excellent.  We achieved the extirpated portion of the procedure today.  A closed  suction drain was brought through the previous left lateral most robotic port site into the peritoneal cavity.  The specimen retrieval bag was positioned into the left side of the hemiabdomen with the bag string coming through the previous left  paramedian robotic port site.  The right ureter, left ureter, terminal ileum tag sutures were grasped with a self-locking laparoscopic grasper via the 15 mm port site.  Robot was then undocked.  Specimen was retrieved by extending the previous camera  port site inferiorly, erring towards the left side of the  umbilicus for a distance of approximately 6 cm and removing the large en bloc specimen and setting aside for permanent pathology.  A wound protector type retractor was then placed via this site  and the right ureter, left ureter, the terminal ileum tag sutures were delivered through this and the bilateral ureters were separately grasped using atraumatic Babcock forceps, as was the area of distal ileum.  There appeared to be sufficient  vascularity and length and favorable geometry for conduit formation.  Next, a 14 cm segment of terminal ileum was taken down to continuity using green load stapler proximal and distal at the previously marked site, taking exquisite care to maintain  proximal and distal orientation.  The mesentery of the conduit was developed using 1.5 loads of the white load stapler distal, 1 load proximal, taking exquisite care to avoid devascularization of the conduit or anastomotic segments.  The conduit segment  was then oriented in the retroperitoneal orientation.  Bowel-bowel anastomosis was performed using 1.5 loads of green load stapler on the antimesenteric border.  The free end was oversewn using running silk, with a second imbricating layer of running  silk.  The acute angle to the anastomosis was bolstered using interrupted silk.  The mesenteric defect was reapproximated using interrupted silk.  The bowel-bowel anastomosis was visibly viable, palpably patent, redelivered into the abdominal cavity.   The proximal staple line of the conduit was excluded using running Vicryl.  The distal staple line was removed as were the tag clips and again, it appeared to be a suitable length and vascularity for conduit formation.  Attention was then directed to the  ureteroenteric anastomosis, first of the left ureter, the butt end of the conduit approximately 1.5 cm away from the staple line, a 4 mm V-shaped incision was made in the area of the proximal conduit and a small piece of bowel mucosa  and serosa was  excised.  Four mucosal everting sutures of 4-0 Vicryl were applied.  The left ureter was spatulated and the final margin set aside, spatulation for 1 cm, heel stitch of 4-0 Vicryl was applied and a blue color Bander stent was placed to 26 cm from the  anastomosis.  Further ureteroenteric anastomosis was performed using two separate suture lines of 4-0 Vicryl, which revealed an excellent tension-free apposition.  Next, the right ureter was anastomosed in the exact same fashion via a separate small site  in the proximal conduit, placing a red color Bander stent to 26 cm from anastomosis.  Next, 4-0 chromic was used to anchor each stent separately to the midsection of the conduit with  purposeful air knot to prevent inadvertent early displacement. Wound  protector was removed.  A quarter size diameter column of the skin, fibrofatty tissue was excised from the previously marked stomal site to the level of the fascia which was dilated to accommodate 2 surgeon's  fingers and 4 Vicryl sutures were placed at  the level the fascia in a quadrant fashion and the distal conduit was brought through this and again appeared to have a sufficient mobilization for adequate formation.  The 4 fascial anchor sutures were then applied to the level of the proximal conduit  in a quadrant fashion to prevent parastomal hernia formation; in a quadrant fashion, 4 rosebud sutures were also applied. These were then set aside. The fascia at the extraction site was then closed using figure-of-eight PDS x7 after bringing omentum  over this area.  All incision sites were infiltrated with dilute lipolyzed Marcaine.  The extraction site was further closed using running Vicryl at the level of Scarpa's.  The 4 quadrant rosebudding sutures were tied down, which revealed nice  rosebudding of the stoma. Further stomal mucosa to skin reapproximation was performed using interrupted Vicryl in a quadrant fashion x2 of each quadrant.  All  skin sites were then closed at the level of the skin using subcuticular Monocryl followed by  Dermabond. Drain stitch was applied.  The procedure was terminated after stoma appliance was placed.  The patient tolerated the procedure well.  No immediate perioperative complications. The patient was taken to postanesthesia care in stable condition.   Plan for progressive care admission.  Please note, first assistant, Debbrah Alar was crucial for all portions of surgery today.  She provided invaluable retraction, suctioning, specimen manipulation, vascular clipping, vascular stapling and general first assistance.   SHW D: 04/08/2020 12:43:45 pm T: 04/09/2020 7:04:00 am  JOB: [id number]/ [id number]

## 2020-04-11 NOTE — Plan of Care (Signed)

## 2020-04-12 LAB — BASIC METABOLIC PANEL
Anion gap: 8 (ref 5–15)
BUN: 11 mg/dL (ref 6–20)
CO2: 24 mmol/L (ref 22–32)
Calcium: 8.5 mg/dL — ABNORMAL LOW (ref 8.9–10.3)
Chloride: 108 mmol/L (ref 98–111)
Creatinine, Ser: 0.84 mg/dL (ref 0.44–1.00)
GFR, Estimated: >60 mL/min (ref >60)
Glucose, Bld: 90 mg/dL (ref 70–99)
Potassium: 3.8 mmol/L (ref 3.5–5.1)
Sodium: 140 mmol/L (ref 135–145)

## 2020-04-12 LAB — CREATININE, FLUID (PLEURAL, PERITONEAL, JP DRAINAGE): Creat, Fluid: 0.8 mg/dL

## 2020-04-12 LAB — HEMOGLOBIN AND HEMATOCRIT, BLOOD
HCT: 34.1 % — ABNORMAL LOW (ref 36.0–46.0)
Hemoglobin: 10.9 g/dL — ABNORMAL LOW (ref 12.0–15.0)

## 2020-04-12 NOTE — Progress Notes (Signed)
4 Days Post-Op   Subjective/Chief Complaint:   1 - Refractory High Grade Bladder Cancer -  S/p robotic cystectomy + TAH/BSA + ICG sentinal/template node dissection + conduit diversion 04/08/20. Path [id number]. Admitted 3/10 for bowel prep and stomal marking.  2 - Ileus - NPO initially, ice chips POD 1. Flatus POD 2 and advanced to clears, fulls POD 3 and saline locked. Reg diet POD 4.   3- Disposition / Rehab - independent in ADL's at baseline. Ostomy RN working wti in house for [employer] PT eval 3/13 feels no dedicated rehab needs.    Today "Sarah Rush" is doing well. Doing well with bowel funciton and ambulation. Pain controlled.     Objective: Vital signs in last 24 hours: Temp:  [97.9 F (36.6 C)-98.5 F (36.9 C)] 98 F (36.7 C) (03/15 0514) Pulse Rate:  [75-77] 76 (03/15 0514) Resp:  [18-22] 18 (03/15 0514) BP: (125-139)/(71-76) 125/71 (03/15 0514) SpO2:  [96 %-98 %] 96 % (03/15 0514) Last BM Date: 04/11/20  Intake/Output from previous day: 03/14 0701 - 03/15 0700 In: 462.1 [I.V.:289.2; IV Piggyback:172.8] Out: 2774 [Urine:1300; Drains:245] Intake/Output this shift: No intake/output data recorded.  EXAM: General: Alert and oriented. Very pleasant.  CV: Regular rate Lungs: NWOB on RA Abdomen: Soft, nondistended, appropriately tender. Urostomy pink/healthy with 2 bander stents in place (right - red, left - blue), productive of pink-tinged urine. LLQ JP with SS output. Incisions intact and covered with dermabond; no signs of infection Ext: Warm and well-perfused. No c/c/e.   Lab Results:  Recent Labs    04/11/20 0402 04/12/20 0416  HGB 10.5* 10.9*  HCT 32.4* 34.1*   BMET Recent Labs    04/11/20 0402 04/12/20 0416  NA 138 140  K 3.7 3.8  CL 107 108  CO2 25 24  GLUCOSE 95 90  BUN 7 11  CREATININE 0.71 0.84  CALCIUM 8.7* 8.5*   PT/INR No results for input(s): LABPROT, INR in the last 72 hours. ABG No results for input(s): PHART, HCO3 in the last 72  hours.  Invalid input(s): PCO2, PO2  Studies/Results: No results found.  Anti-infectives: Anti-infectives (From admission, onward)   Start     Dose/Rate Route Frequency Ordered Stop   04/08/20 1800  piperacillin-tazobactam (ZOSYN) IVPB 3.375 g        3.375 g 12.5 mL/hr over 240 Minutes Intravenous Every 8 hours 04/08/20 1635     04/08/20 1633  piperacillin-tazobactam (ZOSYN) IVPB 3.375 g  Status:  Discontinued        3.375 g 100 mL/hr over 30 Minutes Intravenous 30 min pre-op 04/08/20 1633 04/08/20 1635   04/08/20 0700  piperacillin-tazobactam (ZOSYN) IVPB 3.375 g        3.375 g 100 mL/hr over 30 Minutes Intravenous 30 min pre-op 04/07/20 1610 04/08/20 0730   04/08/20 0400  neomycin (MYCIFRADIN) tablet 1,000 mg  Status:  Discontinued        1,000 mg Oral Every 4 hours 04/08/20 0205 04/08/20 1159   04/08/20 0205  metroNIDAZOLE (FLAGYL) tablet 500 mg  Status:  Discontinued        500 mg Oral Every 4 hours 04/08/20 0205 04/08/20 1059      Assessment/Plan:  Doing exceptionally well POD 4. Discussed goals for DC, likely tomorrow based on current progress. HHRN orders and case management consult placed. Check JP Cr.   Sarah Rush 04/12/2020

## 2020-04-12 NOTE — Progress Notes (Signed)
Physical Therapy Treatment/Discharge from PT caseload Patient Details Name: Sarah Rush MRN: [id number] DOB: [birth year] Today's Date: 04/12/2020    History of Present Illness Pt s/p xi robotic assisted laparoscopic complete cystect, total abdominal hysterectomy, and bilateral salpingo-oophorectomy 2* bladder CA 04/09/19.  Pt with hx of RA    PT Comments    Progressing well. Pt walked 2 laps around the unit. No LOB. She has been walking unit unassisted as well. No further PT needs. Will sign off and d/c from PT caseload.    Follow Up Recommendations  No PT follow up     Equipment Recommendations  None recommended by PT    Recommendations for Other Services       Precautions / Restrictions Precautions Precautions: Fall Precaution Comments: JP drain on L Restrictions Weight Bearing Restrictions: No    Mobility  Bed Mobility Overal bed mobility: Modified Independent                  Transfers Overall transfer level: Modified independent                  Ambulation/Gait Ambulation/Gait assistance: Modified independent (Device/Increase time) Gait Distance (Feet): 1000 Feet Assistive device: IV Pole Gait Pattern/deviations: Step-through pattern;Decreased stride length     General Gait Details: good gait speed. No LOB. Pt has been walking the unit unassisted.   Stairs             Wheelchair Mobility    Modified Rankin (Stroke Patients Only)       Balance Overall balance assessment: Mild deficits observed, not formally tested                                          Cognition Arousal/Alertness: Awake/alert Behavior During Therapy: WFL for tasks assessed/performed Overall Cognitive Status: Within Functional Limits for tasks assessed                                        Exercises      General Comments        Pertinent Vitals/Pain Pain Assessment: 0-10 Pain Score: 4  Pain Location:  abdomen Pain Descriptors / Indicators: Discomfort;Sore Pain Intervention(s): Monitored during session    Home Living                      Prior Function            PT Goals (current goals can now be found in the care plan section) Progress towards PT goals: Goals met/education completed, patient discharged from PT (pt has mostly met goals-walking unit unassisted)    Frequency           PT Plan      Co-evaluation              AM-PAC PT "6 Clicks" Mobility   Outcome Measure  Help needed turning from your back to your side while in a flat bed without using bedrails?: None Help needed moving from lying on your back to sitting on the side of a flat bed without using bedrails?: None Help needed moving to and from a bed to a chair (including a wheelchair)?: None Help needed standing up from a chair using your arms (e.g., wheelchair or bedside chair)?: None Help needed to  walk in hospital room?: None Help needed climbing 3-5 steps with a railing? : A Little 6 Click Score: 23    End of Session   Activity Tolerance: Patient tolerated treatment well Patient left: in bed;with call bell/phone within reach;with family/visitor present         Time: 7703-4035 PT Time Calculation (min) (ACUTE ONLY): 8 min  Charges:  $Gait Training: 8-22 mins                         Doreatha Massed, PT Acute Rehabilitation  Office: [phone] Pager: [phone]

## 2020-04-13 LAB — BASIC METABOLIC PANEL
Anion gap: 7 (ref 5–15)
BUN: 15 mg/dL (ref 6–20)
CO2: 25 mmol/L (ref 22–32)
Calcium: 8.9 mg/dL (ref 8.9–10.3)
Chloride: 109 mmol/L (ref 98–111)
Creatinine, Ser: 0.88 mg/dL (ref 0.44–1.00)
GFR, Estimated: >60 mL/min (ref >60)
Glucose, Bld: 113 mg/dL — ABNORMAL HIGH (ref 70–99)
Potassium: 3.3 mmol/L — ABNORMAL LOW (ref 3.5–5.1)
Sodium: 141 mmol/L (ref 135–145)

## 2020-04-13 LAB — HEMOGLOBIN AND HEMATOCRIT, BLOOD
HCT: 33.2 % — ABNORMAL LOW (ref 36.0–46.0)
Hemoglobin: 10.9 g/dL — ABNORMAL LOW (ref 12.0–15.0)

## 2020-04-13 MED ORDER — HYDROCODONE-ACETAMINOPHEN 5-325 MG PO TABS: 1.0000 | ORAL_TABLET | Freq: Four times a day (QID) | ORAL | 0 refills | Status: AC | PRN

## 2020-04-13 NOTE — Progress Notes (Signed)
Discharge instructions reviewed with pt. Verbalizes understanding of education. Pt d/c'd home with husband.

## 2020-04-13 NOTE — Consult Note (Addendum)
[facility] Nurse ostomy consult note Pt had urostomy surgery performed on 3/11 Husband at the bedside for teaching demonstration.  Pt performed most of the procedure with minimal assistance using a hand held mirror. Stoma type/location: Stoma is red and viable, some small amt bleeding to suture edges. Stomal assessment/size: 1 1/4 inches, flush with skin level Peristomal assessment: red moist partial thickness medical adhesive related skin damage from tape on the boarder edge at 10:00-12:00 is dry and red and slowly resolving. Output: Mod amt blood tinged urine; 2 stints in place   Ostomy pouching: 1pc.  Education provided:  Pouch change performed using one piece convex pouch (# [id number]) and barrier ring (# [id number]) to attempt to maintain a seal.  Pt was able to open and close to empty and attach and remove bedside drainage bag independently.  Reviewed ordering supplies and pouching routines.  5 sets of barrier rings and pouches provided, also belt, adapter, and bedside drainage bag. Pt plans to discharge today.  Enrolled patient in [place] program: Yes; previously. Julien Girt MSN, RN, [place], [place], [place]

## 2020-04-13 NOTE — Discharge Summary (Signed)
Physician Discharge Summary  Patient ID: Sarah Rush MRN: [id number] DOB/AGE: [id number] y.o.  Admit date: 04/07/2020 Discharge date: 04/13/2020  Admission Diagnoses: Bladder Cancer  Discharge Diagnoses:  Active Problems:   Bladder cancer [facility])   Discharged Condition: good  Hospital Course:   1 - Refractory High Grade Bladder Cancer -  S/p robotic cystectomy + TAH/BSA + ICG sentinal/template node dissection + conduit diversion 04/08/20. Path [id number]. Admitted 3/10 for bowel prep and stomal marking. JP removed 3/16 as Cr same as serum.  2 - Ileus - NPO initially, ice chips POD 1. Flatus POD 2 and advanced to clears, fulls POD 3 and saline locked. Reg diet POD 4.   3- Disposition / Rehab - independent in ADL's at baseline. Ostomy RN working wti in house for [employer] PT eval 3/13 feels no dedicated rehab needs.  Consults: case management, ostomy RN  Significant Diagnostic Studies: labs: as per above  Treatments: surgery: as per above  Discharge Exam: Blood pressure 133/89, pulse 73, temperature 98.1 F (36.7 C), temperature source Oral, resp. rate 18, height 5\' 4"  (1.626 m), weight 69.5 kg, SpO2 97 %.  General: Alert and oriented. Very pleasant.  CV: Regular rate Lungs: NWOB on RA Abdomen: Soft, nondistended, appropriately tender. Urostomy pink/healthy with 2 bander stents in place (right - red, left - blue), productive of pink-tinged urine. LLQ JP with SS output. Incisions intact and covered with dermabond; no signs of infection JP removed and dry dressing placed.  Ext: Warm and well-perfused. No c/c/e.   Disposition: HOME     Follow-up Information    Alexis Frock, MD On 05/02/2020.   Specialty: Urology Why: at 10:30 for MD visit Contact information: [place] [address] [phone]               Signed: Alexis Frock 04/13/2020, 8:45 AM

## 2020-09-30 ENCOUNTER — Other Ambulatory Visit: Payer: Self-pay

## 2020-09-30 ENCOUNTER — Ambulatory Visit (HOSPITAL_COMMUNITY)
Admission: RE | Admit: 2020-09-30 | Discharge: 2020-09-30 | Disposition: A | Discharge status: Home / Self Care | Payer: Commercial Managed Care - PPO | Source: Ambulatory Visit | Attending: Urology | Admitting: Urology

## 2020-09-30 ENCOUNTER — Other Ambulatory Visit (HOSPITAL_COMMUNITY): Payer: Self-pay | Admitting: Urology

## 2020-09-30 DIAGNOSIS — C678 Malignant neoplasm of overlapping sites of bladder: Secondary | ICD-10-CM | POA: Insufficient documentation

## 2020-11-22 DIAGNOSIS — E559 Vitamin D deficiency, unspecified: Secondary | ICD-10-CM | POA: Diagnosis present

## 2021-04-13 IMAGING — CT CT ABD-PEL WO/W CM
3 of 12 series · 11 of 46 positions shown, 17 images · IV contrast (omnipaque)
Comparison: None.

CLINICAL DATA: Microscopic hematuria.

EXAM:
CT ABDOMEN AND PELVIS WITHOUT AND WITH CONTRAST
TECHNIQUE: Multidetector CT imaging of the abdomen and pelvis was performed
following the standard protocol before and following the bolus
administration of intravenous contrast.
CONTRAST:  125mL OMNIPAQUE IOHEXOL 300 MG/ML  SOLN

[Series 3: abd without pre 5.00 · axial · non-contrast · 0.73mm/px · z∈[-1449,-1119]mm · 7 of 88 slices shown, 12 images]
[im 11/88  soft-tissue]
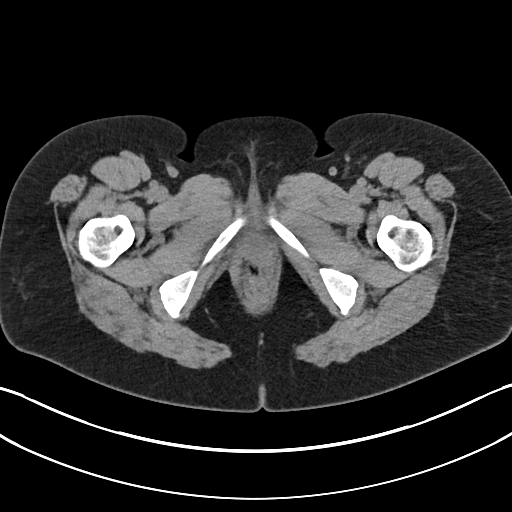
[im 11/88  bone]
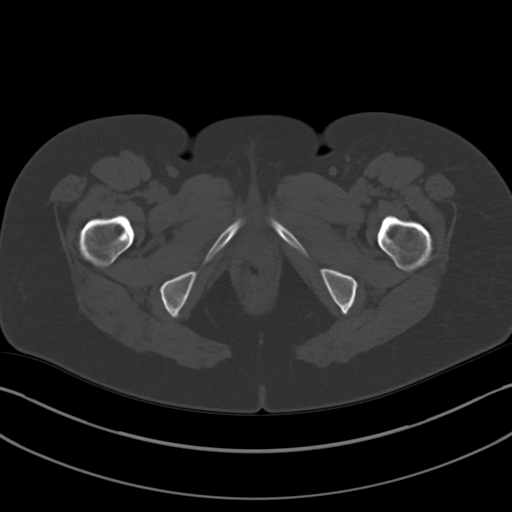
[im 22/88  soft-tissue]
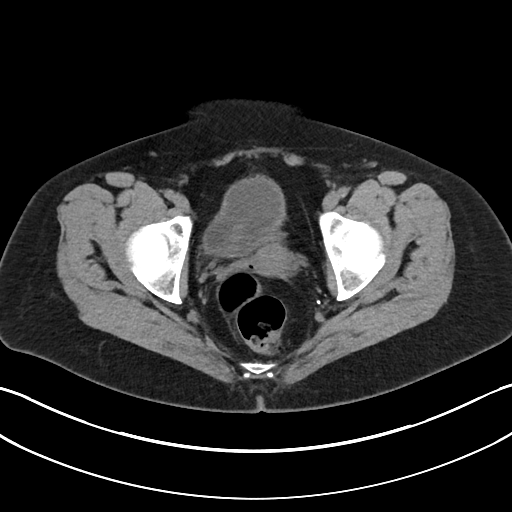
[im 33/88  soft-tissue]
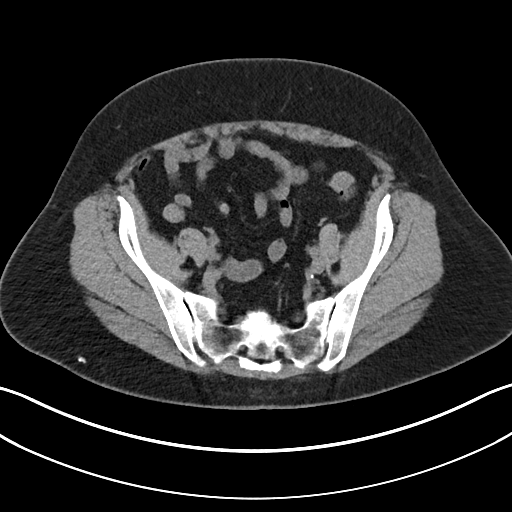
[im 44/88  soft-tissue]
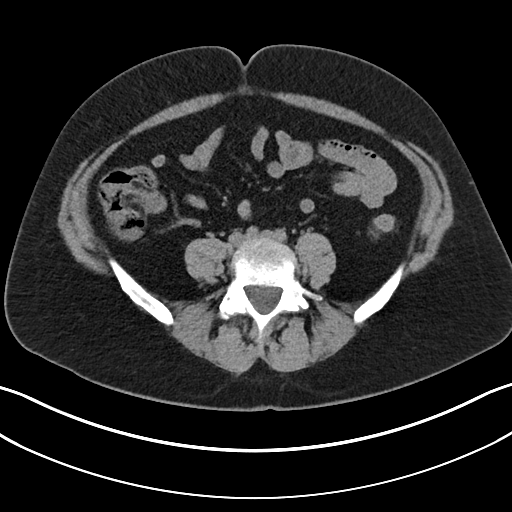
[im 44/88  lung]
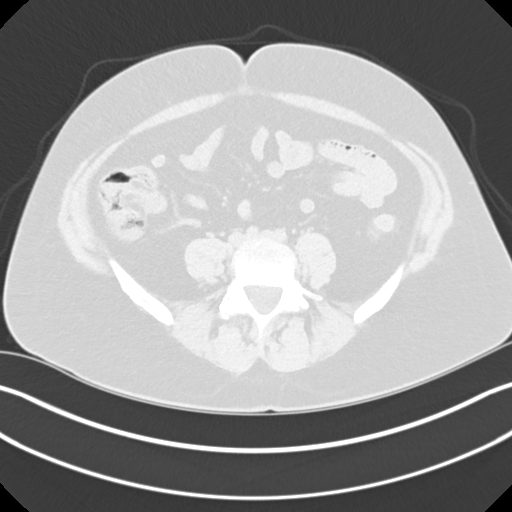
[im 55/88  soft-tissue]
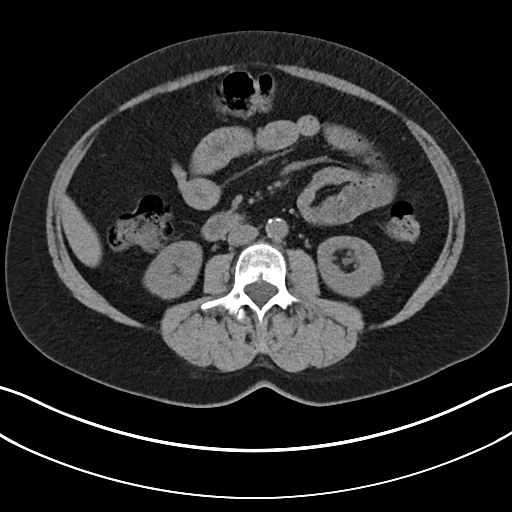
[im 55/88  lung]
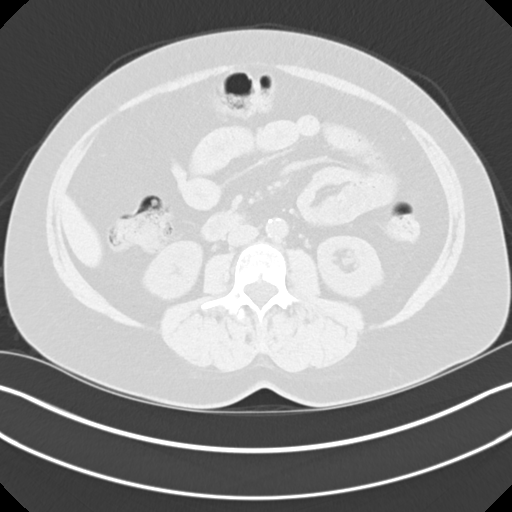
[im 66/88  soft-tissue]
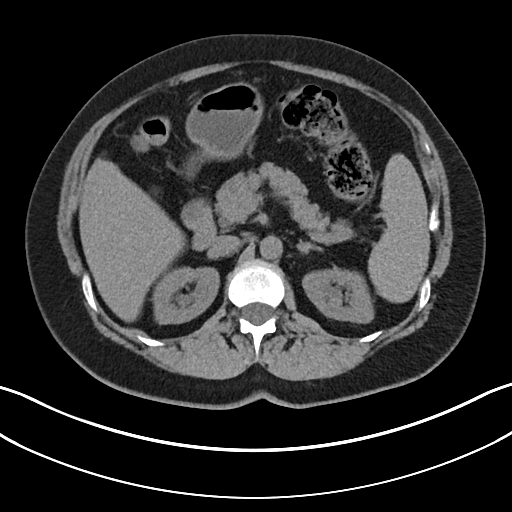
[im 66/88  lung]
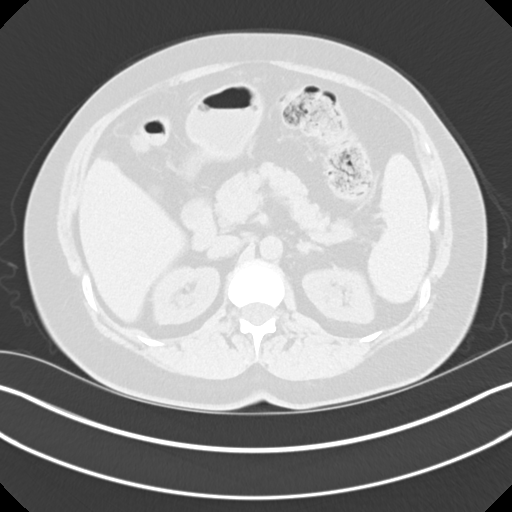
[im 77/88  soft-tissue]
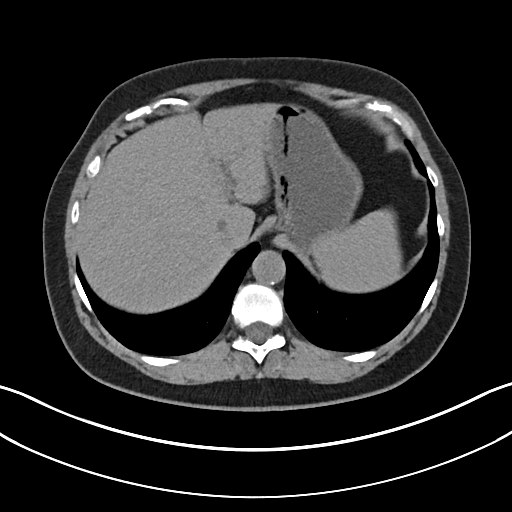
[im 77/88  lung]
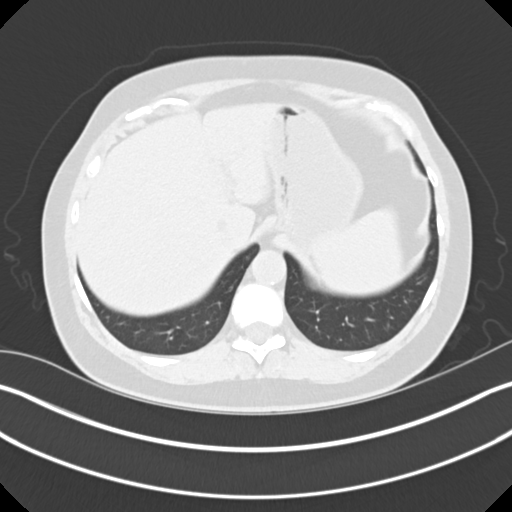

[Series 6: cor without without pre 2.00 cor · coronal · non-contrast · 0.73mm/px · 2 of 139 slices shown, 3 images]
[im 47/139  soft-tissue]
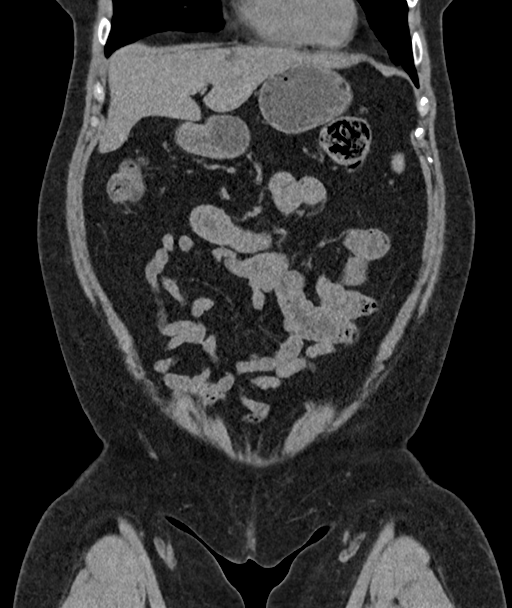
[im 47/139  bone]
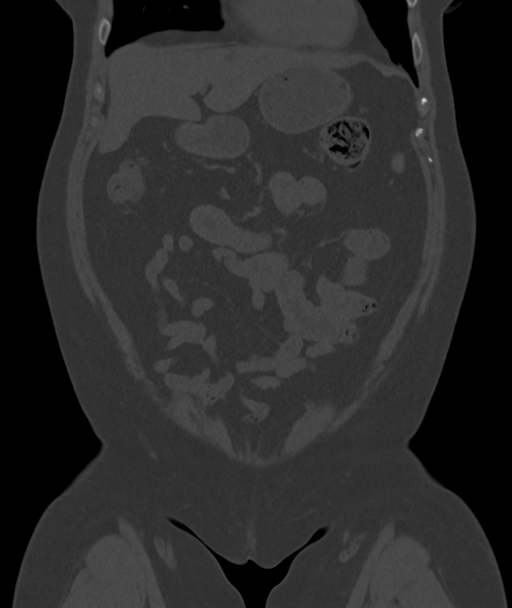
[im 93/139  soft-tissue]
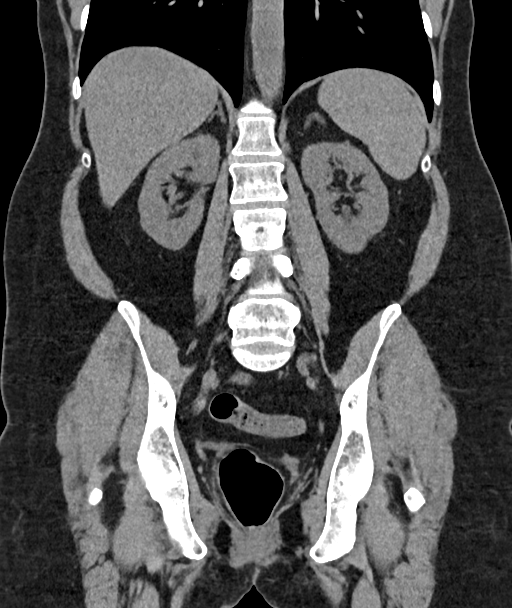

[Series 10: axial with hematuria with 5.00 · axial · 0.73mm/px · z∈[-1439,-1379]mm · 2 of 88 slices shown]
[im 13/88  soft-tissue]
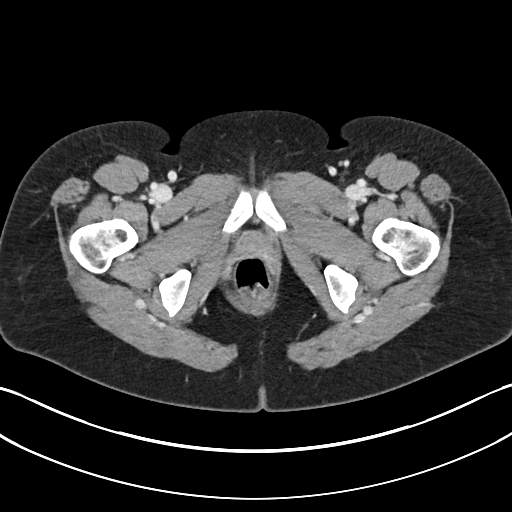
[im 25/88  soft-tissue]
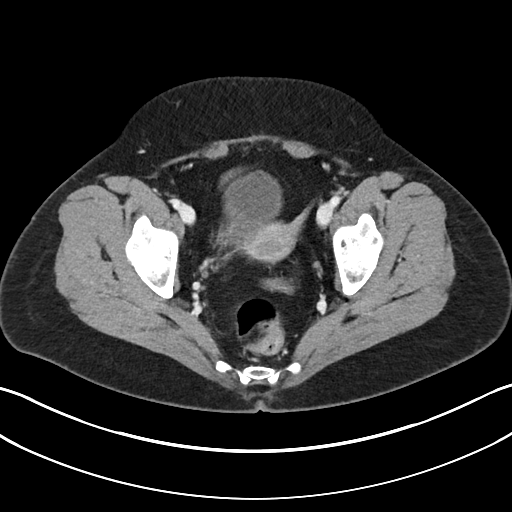

[11 of 46 positions shown; findings below may reference images not displayed]

FINDINGS: Lower chest: Unremarkable.

Hepatobiliary: Multiple well-defined homogeneous low-density lesions
in the liver parenchyma. The larger lesions measure up to 1.6 cm and
approach water attenuation, compatible with cyst. Smaller 3-5 mm
lesions are too small to definitively characterize but are likely
benign. Gallbladder surgically absent. No intrahepatic or
extrahepatic biliary dilation.

Pancreas: No focal mass lesion. No dilatation of the main duct. No
intraparenchymal cyst. No peripancreatic edema.

Spleen: No splenomegaly. No focal mass lesion.

Adrenals/Urinary Tract: No adrenal nodule or mass.

Precontrast imaging shows punctate (1-2 mm) stones in both kidneys.
Stones are best seen on coronal imaging. Probable 2 punctate
adjacent stones upper pole right kidney on images 100 and 101 of
coronal precontrast series 6. punctate nonobstructing stone lower
pole left kidney visible on image 85/6. No ureteral stones. No
hydroureteronephrosis.

Imaging after IV contrast administration shows no suspicious
enhancing lesion in either kidney. A 2.5 x 2.7 cm enhancing polypoid
lesion identified posterior right bladder, involving the UVJ noted
on image 69/series 10. Peripheral calcification associated with the
lesion.

Delayed post-contrast imaging shows no wall thickening or soft
tissue filling defect in either intrarenal collecting system or
renal pelvis. Both ureters are well opacified and normal by CT
imaging. Delayed imaging confirms the presence of the polypoid
posterior bladder wall lesion.

Stomach/Bowel: Stomach is unremarkable. No gastric wall thickening.
No evidence of outlet obstruction. Duodenum is normally positioned
as is the ligament of Treitz. No small bowel wall thickening. No
small bowel dilatation. The terminal ileum is normal. The appendix
is normal. No gross colonic mass. No colonic wall thickening.

Vascular/Lymphatic: There is abdominal aortic atherosclerosis
without aneurysm. There is no gastrohepatic or hepatoduodenal
ligament lymphadenopathy. No intraperitoneal or retroperitoneal
lymphadenopathy. No pelvic sidewall lymphadenopathy. Venous
collateralization noted in the presacral space.

Reproductive: The uterus is unremarkable.  There is no adnexal mass.

Other: No intraperitoneal free fluid.

Musculoskeletal: No worrisome lytic or sclerotic osseous
abnormality.
IMPRESSION: 1. 2.7 cm enhancing polypoid lesion identified in the posterior
right bladder, apparently involving the UVJ although no associated
right hydroureteronephrosis. Imaging features compatible with
urothelial neoplasm. No evidence for metastatic disease. No
lymphadenopathy in the abdomen/pelvis.
2. Bilateral punctate nonobstructing renal stones.
3. Numerous liver lesions of varying size. These are well-defined
and low attenuation. Larger lesions approach water density and are
probably cysts. While the tiny lesions are too small to
characterize, these are also likely benign and may reflect hepatic
cysts.
4.  Aortic Atherosclerois ([id number])

## 2021-06-18 LAB — COLOGUARD: COLOGUARD: NEGATIVE

## 2021-10-03 ENCOUNTER — Other Ambulatory Visit (HOSPITAL_COMMUNITY): Payer: Self-pay | Admitting: Urology

## 2021-10-03 ENCOUNTER — Ambulatory Visit (HOSPITAL_COMMUNITY)
Admission: RE | Admit: 2021-10-03 | Discharge: 2021-10-03 | Disposition: A | Discharge status: Home / Self Care | Payer: BC Managed Care – PPO | Source: Ambulatory Visit | Attending: Urology | Admitting: Urology

## 2021-10-03 DIAGNOSIS — C678 Malignant neoplasm of overlapping sites of bladder: Secondary | ICD-10-CM | POA: Insufficient documentation

## 2021-10-03 DIAGNOSIS — N200 Calculus of kidney: Secondary | ICD-10-CM | POA: Diagnosis not present

## 2021-10-03 DIAGNOSIS — A4151 Sepsis due to Escherichia coli [E. coli]: Secondary | ICD-10-CM | POA: Diagnosis not present

## 2021-10-06 ENCOUNTER — Encounter (HOSPITAL_COMMUNITY): Payer: Self-pay

## 2021-10-06 ENCOUNTER — Inpatient Hospital Stay (HOSPITAL_COMMUNITY): Payer: BC Managed Care – PPO

## 2021-10-06 ENCOUNTER — Inpatient Hospital Stay (HOSPITAL_COMMUNITY)
Admission: EM | Admit: 2021-10-06 | Discharge: 2021-10-08 | DRG: 872 | Disposition: A | Discharge status: Home / Self Care | Payer: BC Managed Care – PPO | Source: Ambulatory Visit | Attending: Internal Medicine | Admitting: Internal Medicine

## 2021-10-06 ENCOUNTER — Other Ambulatory Visit: Payer: Self-pay

## 2021-10-06 DIAGNOSIS — E871 Hypo-osmolality and hyponatremia: Secondary | ICD-10-CM | POA: Diagnosis present

## 2021-10-06 DIAGNOSIS — M059 Rheumatoid arthritis with rheumatoid factor, unspecified: Secondary | ICD-10-CM | POA: Diagnosis present

## 2021-10-06 DIAGNOSIS — E876 Hypokalemia: Secondary | ICD-10-CM | POA: Diagnosis present

## 2021-10-06 DIAGNOSIS — E559 Vitamin D deficiency, unspecified: Secondary | ICD-10-CM | POA: Diagnosis present

## 2021-10-06 DIAGNOSIS — N200 Calculus of kidney: Secondary | ICD-10-CM | POA: Diagnosis present

## 2021-10-06 DIAGNOSIS — E860 Dehydration: Secondary | ICD-10-CM | POA: Diagnosis present

## 2021-10-06 DIAGNOSIS — N136 Pyonephrosis: Secondary | ICD-10-CM | POA: Diagnosis present

## 2021-10-06 DIAGNOSIS — Z807 Family history of other malignant neoplasms of lymphoid, hematopoietic and related tissues: Secondary | ICD-10-CM

## 2021-10-06 DIAGNOSIS — N390 Urinary tract infection, site not specified: Secondary | ICD-10-CM | POA: Diagnosis present

## 2021-10-06 DIAGNOSIS — N179 Acute kidney failure, unspecified: Secondary | ICD-10-CM | POA: Diagnosis present

## 2021-10-06 DIAGNOSIS — F32A Depression, unspecified: Secondary | ICD-10-CM | POA: Diagnosis present

## 2021-10-06 DIAGNOSIS — R112 Nausea with vomiting, unspecified: Secondary | ICD-10-CM | POA: Diagnosis present

## 2021-10-06 DIAGNOSIS — A4151 Sepsis due to Escherichia coli [E. coli]: Principal | ICD-10-CM | POA: Diagnosis present

## 2021-10-06 DIAGNOSIS — Z8551 Personal history of malignant neoplasm of bladder: Secondary | ICD-10-CM | POA: Diagnosis not present

## 2021-10-06 DIAGNOSIS — Z7960 Long term (current) use of unspecified immunomodulators and immunosuppressants: Secondary | ICD-10-CM

## 2021-10-06 DIAGNOSIS — K219 Gastro-esophageal reflux disease without esophagitis: Secondary | ICD-10-CM | POA: Diagnosis present

## 2021-10-06 DIAGNOSIS — F419 Anxiety disorder, unspecified: Secondary | ICD-10-CM | POA: Diagnosis present

## 2021-10-06 DIAGNOSIS — Z79899 Other long term (current) drug therapy: Secondary | ICD-10-CM | POA: Diagnosis not present

## 2021-10-06 DIAGNOSIS — C679 Malignant neoplasm of bladder, unspecified: Secondary | ICD-10-CM | POA: Diagnosis present

## 2021-10-06 DIAGNOSIS — Z87891 Personal history of nicotine dependence: Secondary | ICD-10-CM | POA: Diagnosis not present

## 2021-10-06 HISTORY — PX: IR NEPHROSTOMY PLACEMENT LEFT: IMG6063

## 2021-10-06 LAB — URINALYSIS, ROUTINE W REFLEX MICROSCOPIC
Bilirubin Urine: NEGATIVE
Glucose, UA: NEGATIVE mg/dL
Ketones, ur: NEGATIVE mg/dL
Nitrite: NEGATIVE
Protein, ur: 30 mg/dL — AB
RBC / HPF: >50 RBC/hpf — ABNORMAL HIGH (ref 0–5)
Specific Gravity, Urine: 1.045 — ABNORMAL HIGH (ref 1.005–1.030)
WBC, UA: >50 WBC/hpf — ABNORMAL HIGH (ref 0–5)
pH: 6 (ref 5.0–8.0)

## 2021-10-06 LAB — BASIC METABOLIC PANEL
Anion gap: 8 (ref 5–15)
BUN: 24 mg/dL — ABNORMAL HIGH (ref 8–23)
CO2: 21 mmol/L — ABNORMAL LOW (ref 22–32)
Calcium: 9.3 mg/dL (ref 8.9–10.3)
Chloride: 104 mmol/L (ref 98–111)
Creatinine, Ser: 1.37 mg/dL — ABNORMAL HIGH (ref 0.44–1.00)
GFR, Estimated: 44 mL/min — ABNORMAL LOW (ref >60)
Glucose, Bld: 92 mg/dL (ref 70–99)
Potassium: 3.8 mmol/L (ref 3.5–5.1)
Sodium: 133 mmol/L — ABNORMAL LOW (ref 135–145)

## 2021-10-06 LAB — CBC WITH DIFFERENTIAL/PLATELET
Abs Immature Granulocytes: 0.03 10*3/uL (ref 0.00–0.07)
Basophils Absolute: 0 10*3/uL (ref 0.0–0.1)
Basophils Relative: 0 %
Eosinophils Absolute: 0 10*3/uL (ref 0.0–0.5)
Eosinophils Relative: 0 %
HCT: 39.3 % (ref 36.0–46.0)
Hemoglobin: 13.2 g/dL (ref 12.0–15.0)
Immature Granulocytes: 0 %
Lymphocytes Relative: 5 %
Lymphs Abs: 0.4 10*3/uL — ABNORMAL LOW (ref 0.7–4.0)
MCH: 29.5 pg (ref 26.0–34.0)
MCHC: 33.6 g/dL (ref 30.0–36.0)
MCV: 87.9 fL (ref 80.0–100.0)
Monocytes Absolute: 0.4 10*3/uL (ref 0.1–1.0)
Monocytes Relative: 4 %
Neutro Abs: 7.9 10*3/uL — ABNORMAL HIGH (ref 1.7–7.7)
Neutrophils Relative %: 91 %
Platelets: 159 10*3/uL (ref 150–400)
RBC: 4.47 MIL/uL (ref 3.87–5.11)
RDW: 13.3 % (ref 11.5–15.5)
WBC: 8.7 10*3/uL (ref 4.0–10.5)
nRBC: 0 % (ref 0.0–0.2)

## 2021-10-06 LAB — PROTIME-INR
INR: 1.2 (ref 0.8–1.2)
Prothrombin Time: 14.8 seconds (ref 11.4–15.2)

## 2021-10-06 MED ORDER — MORPHINE SULFATE (PF) 4 MG/ML IV SOLN
4.0000 mg | Freq: Once | INTRAVENOUS | Status: AC
  Administered 2021-10-06: 4 mg via INTRAVENOUS
  Filled 2021-10-06: qty 1

## 2021-10-06 MED ORDER — MIDAZOLAM HCL 2 MG/2ML IJ SOLN
INTRAMUSCULAR | Status: AC
  Filled 2021-10-06: qty 4

## 2021-10-06 MED ORDER — OXYCODONE HCL 5 MG PO TABS
5.0000 mg | ORAL_TABLET | ORAL | Status: DC | PRN
  Administered 2021-10-06 – 2021-10-07 (×4): 5 mg via ORAL
  Filled 2021-10-06 (×4): qty 1

## 2021-10-06 MED ORDER — LIDOCAINE HCL (PF) 1 % IJ SOLN
INTRAMUSCULAR | Status: AC | PRN
  Administered 2021-10-06: 10 mL

## 2021-10-06 MED ORDER — ACETAMINOPHEN 650 MG RE SUPP: 650.0000 mg | Freq: Four times a day (QID) | RECTAL | Status: DC | PRN

## 2021-10-06 MED ORDER — IOHEXOL 300 MG/ML  SOLN
50.0000 mL | Freq: Once | INTRAMUSCULAR | Status: AC | PRN
  Administered 2021-10-06: 5 mL

## 2021-10-06 MED ORDER — ONDANSETRON HCL 4 MG/2ML IJ SOLN
4.0000 mg | Freq: Once | INTRAMUSCULAR | Status: AC
  Administered 2021-10-06: 4 mg via INTRAVENOUS
  Filled 2021-10-06: qty 2

## 2021-10-06 MED ORDER — LACTATED RINGERS IV BOLUS
1000.0000 mL | Freq: Once | INTRAVENOUS | Status: AC
  Administered 2021-10-06: 1000 mL via INTRAVENOUS

## 2021-10-06 MED ORDER — SODIUM CHLORIDE 0.9 % IV SOLN
2.0000 g | INTRAVENOUS | Status: DC
  Administered 2021-10-06 – 2021-10-07 (×2): 2 g via INTRAVENOUS
  Filled 2021-10-06 (×2): qty 20

## 2021-10-06 MED ORDER — LIDOCAINE HCL 1 % IJ SOLN
INTRAMUSCULAR | Status: AC
  Filled 2021-10-06: qty 20

## 2021-10-06 MED ORDER — KETOROLAC TROMETHAMINE 15 MG/ML IJ SOLN
15.0000 mg | Freq: Once | INTRAMUSCULAR | Status: AC
  Administered 2021-10-06: 15 mg via INTRAVENOUS
  Filled 2021-10-06: qty 1

## 2021-10-06 MED ORDER — FENTANYL CITRATE (PF) 100 MCG/2ML IJ SOLN
INTRAMUSCULAR | Status: AC
  Filled 2021-10-06: qty 2

## 2021-10-06 MED ORDER — ONDANSETRON HCL 4 MG PO TABS: 4.0000 mg | ORAL_TABLET | Freq: Four times a day (QID) | ORAL | Status: DC | PRN

## 2021-10-06 MED ORDER — LACTATED RINGERS IV SOLN: INTRAVENOUS | Status: DC

## 2021-10-06 MED ORDER — ACETAMINOPHEN 325 MG PO TABS
650.0000 mg | ORAL_TABLET | Freq: Four times a day (QID) | ORAL | Status: DC | PRN
  Administered 2021-10-07 – 2021-10-08 (×2): 650 mg via ORAL
  Filled 2021-10-06 (×2): qty 2

## 2021-10-06 MED ORDER — NALOXONE HCL 0.4 MG/ML IJ SOLN: 0.4000 mg | Freq: Once | INTRAMUSCULAR | Status: DC

## 2021-10-06 MED ORDER — ONDANSETRON HCL 4 MG/2ML IJ SOLN: 4.0000 mg | Freq: Four times a day (QID) | INTRAMUSCULAR | Status: DC | PRN

## 2021-10-06 MED ORDER — MORPHINE SULFATE (PF) 4 MG/ML IV SOLN
4.0000 mg | INTRAVENOUS | Status: DC | PRN
  Administered 2021-10-06 – 2021-10-07 (×4): 4 mg via INTRAVENOUS
  Filled 2021-10-06 (×4): qty 1

## 2021-10-06 NOTE — ED Provider Notes (Signed)
[facility] DEPT Provider Note   CSN: [id number] Arrival date & time: 10/06/21  1101     History  Chief Complaint  Patient presents with   Flank Pain    Sarah Rush is a 61 y.o. female.  61 year old female presents with left-sided flank pain along with vomiting and chills.  Has had nausea as well.  No URI symptoms.  Seen by her urologist today and had a CT scan which showed a left-sided kidney stone with signs of obstruction.  She is also had chills and has a temperature here of 100.4.  Patient sent here for further management and likely to have a ureter stent placed       Home Medications Prior to Admission medications   Medication Sig Start Date End Date Taking? Authorizing Provider  acetaminophen (TYLENOL) 500 MG tablet Take 1,000 mg by mouth every 6 (six) hours as needed for moderate pain.    [provider]  hydroxychloroquine (PLAQUENIL) 200 MG tablet Take 200 mg by mouth 2 (two) times daily. 05/08/19   [provider]  ibuprofen (ADVIL) 200 MG tablet Take 400 mg by mouth every 6 (six) hours as needed for moderate pain.    [provider]  omeprazole (PRILOSEC) 20 MG capsule Take 20 mg by mouth daily as needed (heartburn/indigestion.).    [provider]  predniSONE (DELTASONE) 5 MG tablet Take 5 mg by mouth daily with breakfast.    [provider]  sertraline (ZOLOFT) 50 MG tablet Take 75 mg by mouth at bedtime. 05/23/19   [provider]      Allergies    Patient has no known allergies.    Review of Systems   Review of Systems  All other systems reviewed and are negative.   Physical Exam Updated Vital Signs BP (!) 148/70 (BP Location: Right Arm)   Pulse (!) 144   Temp 98.6 F (37 C) (Oral)   Resp 18   SpO2 96%  Physical Exam Vitals and nursing note reviewed.  Constitutional:      General: She is not in acute distress.    Appearance: Normal appearance. She is  well-developed. She is not toxic-appearing.  HENT:     Head: Normocephalic and atraumatic.  Eyes:     General: Lids are normal.     Conjunctiva/sclera: Conjunctivae normal.     Pupils: Pupils are equal, round, and reactive to light.  Neck:     Thyroid: No thyroid mass.     Trachea: No tracheal deviation.  Cardiovascular:     Rate and Rhythm: Normal rate and regular rhythm.     Heart sounds: Normal heart sounds. No murmur heard.    No gallop.  Pulmonary:     Effort: Pulmonary effort is normal. No respiratory distress.     Breath sounds: Normal breath sounds. No stridor. No decreased breath sounds, wheezing, rhonchi or rales.  Abdominal:     General: There is no distension.     Palpations: Abdomen is soft.     Tenderness: There is no abdominal tenderness. There is left CVA tenderness. There is no rebound.  Musculoskeletal:        General: No tenderness. Normal range of motion.     Cervical back: Normal range of motion and neck supple.  Skin:    General: Skin is warm and dry.     Findings: No abrasion or rash.  Neurological:     Mental Status: She is alert and oriented to  person, place, and time. Mental status is at baseline.     GCS: GCS eye subscore is 4. GCS verbal subscore is 5. GCS motor subscore is 6.     Cranial Nerves: No cranial nerve deficit.     Sensory: No sensory deficit.     Motor: Motor function is intact.  Psychiatric:        Attention and Perception: Attention normal.        Speech: Speech normal.        Behavior: Behavior normal.     ED Results / Procedures / Treatments   Labs (all labs ordered are listed, but only abnormal results are displayed) Labs Reviewed  URINE CULTURE  CULTURE, BLOOD (ROUTINE X 2)  CULTURE, BLOOD (ROUTINE X 2)  URINALYSIS, ROUTINE W REFLEX MICROSCOPIC  CBC WITH DIFFERENTIAL/PLATELET  BASIC METABOLIC PANEL    EKG None  Radiology No results found.  Procedures Procedures    Medications Ordered in ED Medications   lactated ringers infusion (has no administration in time range)    ED Course/ Medical Decision Making/ A&P                           Medical Decision Making Amount and/or Complexity of Data Reviewed Labs: ordered. ECG/medicine tests: ordered.  Risk Prescription drug management.   Discussed with Dr. Lovena Neighbours from neurology who states that he will arrange patient to have a percutaneous tube placed by interventional radiology.  Recommends patient started on 2 g of Rocephin for likely infected urine.  Patient will remain n.p.o..  Request that patient be admitted to the hospitalist service.        Final Clinical Impression(s) / ED Diagnoses Final diagnoses:  None    Rx / DC Orders ED Discharge Orders     None         Lacretia Leigh, MD 10/06/21 1156

## 2021-10-06 NOTE — ED Triage Notes (Signed)
Pt arrived via POV, c/o left sided flank pain, vomiting, chills, fever x2 days. States she had CT and told she had a kidney stone.

## 2021-10-06 NOTE — H&P (Addendum)
History and Physical    Patient: Sarah Rush SEG:[id number] DOB: 22-Jun-1960 DOA: 10/06/2021 DOS: the patient was seen and examined on 10/06/2021 PCP: Danae Orleans, MD  Patient coming from: Home  Chief Complaint:  Chief Complaint  Patient presents with   Flank Pain   HPI: Sarah Rush is a 61 y.o. female with medical history significant of anxiety, bladder cancer, history of complete cystectomy and right ileal conduit, GERD, rheumatoid arthritis who woke up this morning with right flank pain, low-grade fever, fatigue, malaise, nausea and vomiting. No sore throat, rhinorrhea, dyspnea, wheezing or hemoptysis.  No chest pain, palpitations, diaphoresis, PND, orthopnea or pitting edema of the lower extremities.  No diarrhea, constipation, melena or hematochezia.  No flank pain, dysuria, frequency or hematuria.  No polyuria, polydipsia, polyphagia or blurred vision.  ED course: Initial vital signs were temperature 100.4 F, pulse 160, respiration 18, BP 125/92 mmHg O2 sat 96% on room air.  The patient received Rocephin 2 g IVPB in the emergency department.  I added morphine 4 mg IVP, Toradol 15 mg IVP, ondansetron 4 mg IVP and 1000 mL of LR bolus.  Urology was consulted by ED.  They recommended IV ceftriaxone and consulted IR for percutaneous tube placement.  Lab work: Urinalysis showed increase of specific gravity of 1.0 45, large hemoglobinuria, large leukocyte esterase, more than 50 RBC, more than 50 WBC and a few bacteria.  CBC with a white count of 8.7 with 91% neutrophils, hemoglobin 13.3 g/dL platelets 159.  BMP showed sodium 133 and CO2 of 21 mmol/L.  BUN 24 and creatinine 1.37 mg/dL.  The rest of the electrolytes and glucose are normal.  Most recent creatinine level was 0.88 mg/dL.   Review of Systems: As mentioned in the history of present illness. All other systems reviewed and are negative.  Past Medical History:  Diagnosis Date   Anxiety    Cancer ([place])    bladder   GERD  (gastroesophageal reflux disease)    Lower abdominal pain    Rheumatoid arthritis ([facility])    Urinary tract infection symptoms    Past Surgical History:  Procedure Laterality Date   CESAREAN SECTION  1982   CHOLECYSTECTOMY     CYSTOSCOPY W/ RETROGRADES Bilateral 07/02/2019   Procedure: CYSTOSCOPY WITH RETROGRADE PYELOGRAM;  Surgeon: Hollice Espy, MD;  Location: [facility] ORS;  Service: Urology;  Laterality: Bilateral;   CYSTOSCOPY W/ RETROGRADES Bilateral 12/21/2019   Procedure: CYSTOSCOPY WITH RETROGRADE PYELOGRAM;  Surgeon: Hollice Espy, MD;  Location: [facility] OR[facility];  Service: Urology;  Laterality: Bilateral;   CYSTOSCOPY WITH BIOPSY N/A 12/22/2018   Procedure: CYSTOSCOPY WITH BIOPSY;  Surgeon: Hollice Espy, MD;  Location: [facility] OR[facility];  Service: Urology;  Laterality: N/A;   CYSTOSCOPY WITH INJECTION N/A 04/08/2020   Procedure: CYSTOSCOPY WITH INJECTION OF INDOCYANINE GREEN DYE;  Surgeon: Alexis Frock, MD;  Location: [facility];  Service: Urology;  Laterality: N/A;   LYMPHADENECTOMY Bilateral 04/08/2020   Procedure: LYMPHADENECTOMY;  Surgeon: Alexis Frock, MD;  Location: [facility];  Service: Urology;  Laterality: Bilateral;   ROBOT ASSISTED LAPAROSCOPIC COMPLETE CYSTECT ILEAL CONDUIT N/A 04/08/2020   Procedure: XI ROBOTIC ASSISTED LAPAROSCOPIC COMPLETE CYSTECT , TOTAL ABDOMINAL HYSTERECTOMY, BILATERAL SALPINGO-OOPHORECTOMY AND ILEAL CONDUIT;  Surgeon: Alexis Frock, MD;  Location: [facility];  Service: Urology;  Laterality: N/A;  6 HRS   TRANSURETHRAL RESECTION OF BLADDER TUMOR WITH MITOMYCIN-C N/A 12/22/2018   Procedure: TRANSURETHRAL RESECTION OF BLADDER TUMOR WITH Gemcitabine;  Surgeon: Hollice Espy, MD;  Location: [facility];  Service:  Urology;  Laterality: N/A;   TRANSURETHRAL RESECTION OF BLADDER TUMOR WITH MITOMYCIN-C N/A 07/02/2019   Procedure: TRANSURETHRAL RESECTION OF BLADDER TUMOR WITH Gemcitabine;  Surgeon: Hollice Espy, MD;  Location: [facility];  Service: Urology;  Laterality: N/A;    TRANSURETHRAL RESECTION OF BLADDER TUMOR WITH MITOMYCIN-C N/A 12/21/2019   Procedure: TRANSURETHRAL RESECTION OF BLADDER TUMOR WITH Gemcitabine;  Surgeon: Hollice Espy, MD;  Location: [facility];  Service: Urology;  Laterality: N/A;   TRANSURETHRAL RESECTION OF BLADDER TUMOR WITH MITOMYCIN-C N/A 02/01/2020   Procedure: TRANSURETHRAL RESECTION OF BLADDER TUMOR WITH Gemcitabine;  Surgeon: Hollice Espy, MD;  Location: [facility];  Service: Urology;  Laterality: N/A;   Social History:  reports that she quit smoking about 7 years ago. Her smoking use included cigarettes. She has never used smokeless tobacco. She reports current alcohol use. She reports that she does not use drugs.  No Known Allergies  Family History  Problem Relation Age of Onset   Hypertension Other     Prior to Admission medications   Medication Sig Start Date End Date Taking? Authorizing Provider  HUMIRA PEN 40 MG/0.4ML PNKT Inject 40 mg into the skin every 14 (fourteen) days. 09/26/21  Yes [provider]  hydroxychloroquine (PLAQUENIL) 200 MG tablet Take 200 mg by mouth daily. 05/08/19  Yes [provider]  omeprazole (PRILOSEC) 20 MG capsule Take 20 mg by mouth daily as needed (heartburn/indigestion.).   Yes [provider]  sertraline (ZOLOFT) 100 MG tablet Take 100 mg by mouth daily. 09/25/21  Yes [provider]  acetaminophen (TYLENOL) 500 MG tablet Take 1,000 mg by mouth every 6 (six) hours as needed for moderate pain.    [provider]  ibuprofen (ADVIL) 200 MG tablet Take 400 mg by mouth every 6 (six) hours as needed for moderate pain.    [provider]  predniSONE (DELTASONE) 5 MG tablet Take 5 mg by mouth daily with breakfast. Patient not taking: Reported on 10/06/2021    [provider]  sertraline (ZOLOFT) 50 MG tablet Take 75 mg by mouth at bedtime. Patient not taking: Reported on 10/06/2021 05/23/19   [provider]    Physical Exam: Vitals:    10/06/21 1130 10/06/21 1145 10/06/21 1200 10/06/21 1245  BP: (!) 132/109  128/71   Pulse: (!) 142 (!) 134 (!) 134 (!) 109  Resp: 15 (!) 22 (!) 24 20  Temp:      TempSrc:      SpO2: 96% 96% 99% 97%   Physical Exam Vitals and nursing note reviewed.  Constitutional:      General: She is awake. She is not in acute distress.    Appearance: Normal appearance.  HENT:     Head: Normocephalic.     Nose: No rhinorrhea.     Mouth/Throat:     Mouth: Mucous membranes are dry.  Eyes:     General: No scleral icterus.    Pupils: Pupils are equal, round, and reactive to light.  Neck:     Vascular: No JVD.  Cardiovascular:     Rate and Rhythm: Normal rate and regular rhythm.     Heart sounds: S1 normal and S2 normal.  Pulmonary:     Effort: Pulmonary effort is normal.     Breath sounds: Normal breath sounds. No wheezing, rhonchi or rales.  Abdominal:     General: Bowel sounds are normal. There is no distension.     Palpations: Abdomen is soft.     Tenderness: There  is abdominal tenderness in the left lower quadrant. There is left CVA tenderness. There is no guarding.  Musculoskeletal:     Cervical back: Neck supple.     Right lower leg: No edema.     Left lower leg: No edema.  Skin:    General: Skin is warm and dry.  Neurological:     General: No focal deficit present.     Mental Status: She is alert and oriented to person, place, and time.  Psychiatric:        Mood and Affect: Mood normal.        Behavior: Behavior normal. Behavior is cooperative.   Data Reviewed:  There are no new results to review at this time.  Assessment and Plan: Principal Problem: History of:   Bladder cancer ([place]) Status post cystectomy and right ileal conduit   Complicated UTI (urinary tract infection) Admit to telemetry/inpatient. Continue IV fluids. Continue ceftriaxone 2 g IVPB every 24 hours. Follow-up blood culture and sensitivity Follow-up urine culture and sensitivity. Follow CBC and CMP  in a.m. IR has been consulted by urology.  Active Problems:   AKI (acute kidney injury) ([place]) Secondary to:   Dehydration Due to:    Nausea and vomiting Associated with:   Hyponatremia Observation/telemetry. Continue IV fluids. Avoid hypotension. Avoid nephrotoxins. Monitor intake and output. Monitor renal function electrolytes.    Rheumatoid arthritis, seropositive ([place]) Once cleared for oral intake: Resume hydroxychloroquine and physiological prednisone Follow-up with rheumatology for Humira injections.    Anxiety/depression Continue sertraline 100 mg p.o. daily. Follow-up with PCP and behavioral health as an OP.    GERD (gastroesophageal reflux disease) As needed PPI or H2 blocker.    Vitamin D deficiency Continue supplementation at home.     Advance Care Planning:   Code Status: Full Code   Consults: Interventional radiology Jacqulynn Cadet, MD).  Family Communication:   Severity of Illness: The appropriate patient status for this patient is INPATIENT. Inpatient status is judged to be reasonable and necessary in order to provide the required intensity of service to ensure the patient's safety. The patient's presenting symptoms, physical exam findings, and initial radiographic and laboratory data in the context of their chronic comorbidities is felt to place them at high risk for further clinical deterioration. Furthermore, it is not anticipated that the patient will be medically stable for discharge from the hospital within 2 midnights of admission.   * I certify that at the point of admission it is my clinical judgment that the patient will require inpatient hospital care spanning beyond 2 midnights from the point of admission due to high intensity of service, high risk for further deterioration and high frequency of surveillance required.*  Author: Reubin Milan, MD 10/06/2021 2:18 PM  For on call review [email or web address].   This document was prepared using  Dragon voice recognition software and may contain some unintended transcription errors.

## 2021-10-06 NOTE — Progress Notes (Signed)
Office Visit Report     10/06/2021   --------------------------------------------------------------------------------   Sarah Rush  MRN: [id number]  DOB: 02/26/1960, 60 year old Female  SSN:    PRIMARY CARE:    REFERRING:  Ashley Brandon, MD  PROVIDER:  Theodore Manny, M.D.  TREATING:  Christopher Winter, M.D.  LOCATION:  [facility] - [id number]     --------------------------------------------------------------------------------   CC/HPI: Left flank pain   Sarah Rush is a 60-year-old female with a history of high-grade bladder cancer, s/p cystectomy with TAH, BSO and ileal conduit with Dr. Manning in March 2022. She presents today with acute, sharp, constant left-sided flank pain that started this morning and is associated with nausea and vomiting. She denies fever/chills. She had a CT scan this morning that reveals a 5 mm left mid ureteral calculus with moderate hydronephrosis (final read pending).     ALLERGIES: No Allergies    MEDICATIONS: Humira  Hydroxychloroquine Sulfate 200 mg tablet  Vitamin D2  Zoloft 50 mg tablet     GU PSH: Cystoscopy TURBT 2-5 cm Laparoscopy; Lymphadenectomy - 04/08/2020 Locm 300-399Mg/Ml Iodine,1Ml - 10/06/2021, 09/30/2020 Robotic Cystect Ileal Conduit - 04/08/2020 Tlh W/T/O 250 G Or Less - 04/08/2020     NON-GU PSH: Cesarean Delivery Cholecystectomy (laparoscopic)     GU PMH: Bladder Cancer overlapping sites - 10/06/2021, - 04/04/2021, - 10/04/2020, - 09/30/2020, - 05/30/2020, - 05/16/2020, - 05/02/2020, - 04/25/2020, - 04/22/2020, Status post recent radical cystectomy, hysterectomy, lymph node dissection, conduit. New onset right sided back pain. This could be early infection, obstruction or musculoskeletal, I think perhaps the former, however., - 04/20/2020, - 2022, - 2022, - 01/18/2020 Gross hematuria - 04/25/2020 Flank Pain - 04/22/2020, - 04/20/2020 Bladder Cancer, Unspec    NON-GU PMH: Other artificial openings of urinary tract -  04/04/2021, - 10/04/2020, - 05/30/2020, - 05/16/2020 Anxiety Arthritis GERD Rheumatoid Arthritis    FAMILY HISTORY: multiple myeloma - Mother   SOCIAL HISTORY: Marital Status: Married Preferred Language: English; Ethnicity: Not Hispanic Or Latino; Race: White Current Smoking Status: Patient does not smoke anymore.   Tobacco Use Assessment Completed: Used Tobacco in last 30 days? Does not use smokeless tobacco. Does drink.  Does not use drugs. Drinks 2 caffeinated drinks per day. Has not had a blood transfusion.     Notes: ETOH rare   REVIEW OF SYSTEMS:    GU Review Female:   Patient denies frequent urination, hard to postpone urination, burning /pain with urination, get up at night to urinate, leakage of urine, stream starts and stops, trouble starting your stream, have to strain to urinate, and being pregnant.  Gastrointestinal (Upper):   Patient denies nausea, vomiting, and indigestion/ heartburn.  Gastrointestinal (Lower):   Patient denies diarrhea and constipation.  Constitutional:   Patient denies fever, night sweats, weight loss, and fatigue.  Skin:   Patient denies skin rash/ lesion and itching.  Eyes:   Patient denies blurred vision and double vision.  Ears/ Nose/ Throat:   Patient denies sore throat and sinus problems.  Hematologic/Lymphatic:   Patient denies swollen glands and easy bruising.  Cardiovascular:   Patient denies leg swelling and chest pains.  Respiratory:   Patient denies cough and shortness of breath.  Endocrine:   Patient denies excessive thirst.  Musculoskeletal:   Patient denies back pain and joint pain.  Neurological:   Patient denies headaches and dizziness.  Psychologic:   Patient denies depression and anxiety.   VITAL SIGNS:      10/06/2021 10:36 AM    Weight 15 lb / 6.8 kg  Height 64 in / 162.56 cm  BP 148/95 mmHg  Pulse 150 /min  Temperature 98.0 F / 36.6 C  BMI 2.6 kg/m   MULTI-SYSTEM PHYSICAL EXAMINATION:    Constitutional: Visibly  uncomfortable with rigors. Vomit bag in hand  Neurologic / Psychiatric: Oriented to time, oriented to place, oriented to person. No depression, no anxiety, no agitation.     Complexity of Data:  Records Review:   Previous Patient Records  X-Ray Review: C.T. Abdomen/Pelvis: Reviewed Films. Discussed With Patient.     PROCEDURES:          Phenergan 25mg - [id number], [id number] Qty: 12.5 Adm. By: Patti Rivenbark  Unit: mg Lot No [id number]  Route: IM Exp. Date 06/30/2022  Freq: None Mfgr.:   Site: Left Hip   ASSESSMENT:      ICD-10 Details  1 GU:   Ureteral calculus - N20.1 Left, Acute, Systemic Symptoms  2   Ureteral obstruction secondary to calculous - N13.2 Left, Acute, Systemic Symptoms  3   Renal colic - N23 Left, Acute, Systemic Symptoms  4   History of bladder cancer - Z85.51    PLAN:           Document Letter(s):  Created for Patient: Clinical Summary         Notes:    -CT results discussed with the patient. Due to her systemic symptoms and complex GU anatomy, I recommend that she proceed to the emergency department. I will arrange a consultation with interventional radiology to place a left-sided nephroureteral catheter and will have her admitted for pain control, IV fluid resuscitation and likely antibiotics.     

## 2021-10-06 NOTE — Procedures (Signed)
Interventional Radiology Procedure Note  Procedure: LEFT PCN placement, 54I  Complications: None  Estimated Blood Loss: None  Recommendations: - tube to bag - Can try internalization next week    Signed,  Criselda Peaches, MD

## 2021-10-06 NOTE — Consult Note (Signed)
Chief Complaint: Patient was seen in consultation today for left percutaneous nephrostomy/possible nephroureteral catheter placement Chief Complaint  Patient presents with   Flank Pain    Referring Physician(s): Angwin  Supervising Physician: Jacqulynn Cadet  Patient Status: [facility] - ED  History of Present Illness: Sarah Rush is a 61 y.o. female with past medical history of anxiety,GERD, rheumatoid arthritis, bladder cancer with prior cystectomy/ileal conduit creation in 2022.  Patient was seen by urology today in office where CT scan revealed left renal stone with signs of obstruction.  Patient has had recent left-sided flank pain along with nausea ,vomiting ,chills ,fever.  She was referred to the ED for further management and request now received for left nephrostomy/nephroureteral catheter placement.  She is currently afebrile but tachycardic.  BP okay, on IV Rocephin; PT/INR normal, hemoglobin 10.9, blood/urine cultures pending.  Past Medical History:  Diagnosis Date   Anxiety    Cancer ([place])    bladder   GERD (gastroesophageal reflux disease)    Lower abdominal pain    Rheumatoid arthritis (HCC)    Urinary tract infection symptoms     Past Surgical History:  Procedure Laterality Date   CESAREAN SECTION  1982   CHOLECYSTECTOMY     CYSTOSCOPY W/ RETROGRADES Bilateral 07/02/2019   Procedure: CYSTOSCOPY WITH RETROGRADE PYELOGRAM;  Surgeon: Hollice Espy, MD;  Location: [facility];  Service: Urology;  Laterality: Bilateral;   CYSTOSCOPY W/ RETROGRADES Bilateral 12/21/2019   Procedure: CYSTOSCOPY WITH RETROGRADE PYELOGRAM;  Surgeon: Hollice Espy, MD;  Location: [facility];  Service: Urology;  Laterality: Bilateral;   CYSTOSCOPY WITH BIOPSY N/A 12/22/2018   Procedure: CYSTOSCOPY WITH BIOPSY;  Surgeon: Hollice Espy, MD;  Location: [facility];  Service: Urology;  Laterality: N/A;   CYSTOSCOPY WITH INJECTION N/A 04/08/2020   Procedure: CYSTOSCOPY WITH INJECTION OF  INDOCYANINE GREEN DYE;  Surgeon: Alexis Frock, MD;  Location: [facility];  Service: Urology;  Laterality: N/A;   LYMPHADENECTOMY Bilateral 04/08/2020   Procedure: LYMPHADENECTOMY;  Surgeon: Alexis Frock, MD;  Location: [facility];  Service: Urology;  Laterality: Bilateral;   ROBOT ASSISTED LAPAROSCOPIC COMPLETE CYSTECT ILEAL CONDUIT N/A 04/08/2020   Procedure: XI ROBOTIC ASSISTED LAPAROSCOPIC COMPLETE CYSTECT , TOTAL ABDOMINAL HYSTERECTOMY, BILATERAL SALPINGO-OOPHORECTOMY AND ILEAL CONDUIT;  Surgeon: Alexis Frock, MD;  Location: [facility];  Service: Urology;  Laterality: N/A;  6 HRS   TRANSURETHRAL RESECTION OF BLADDER TUMOR WITH MITOMYCIN-C N/A 12/22/2018   Procedure: TRANSURETHRAL RESECTION OF BLADDER TUMOR WITH Gemcitabine;  Surgeon: Hollice Espy, MD;  Location: [facility];  Service: Urology;  Laterality: N/A;   TRANSURETHRAL RESECTION OF BLADDER TUMOR WITH MITOMYCIN-C N/A 07/02/2019   Procedure: TRANSURETHRAL RESECTION OF BLADDER TUMOR WITH Gemcitabine;  Surgeon: Hollice Espy, MD;  Location: [facility];  Service: Urology;  Laterality: N/A;   TRANSURETHRAL RESECTION OF BLADDER TUMOR WITH MITOMYCIN-C N/A 12/21/2019   Procedure: TRANSURETHRAL RESECTION OF BLADDER TUMOR WITH Gemcitabine;  Surgeon: Hollice Espy, MD;  Location: [facility];  Service: Urology;  Laterality: N/A;   TRANSURETHRAL RESECTION OF BLADDER TUMOR WITH MITOMYCIN-C N/A 02/01/2020   Procedure: TRANSURETHRAL RESECTION OF BLADDER TUMOR WITH Gemcitabine;  Surgeon: Hollice Espy, MD;  Location: [facility];  Service: Urology;  Laterality: N/A;    Allergies: Patient has no known allergies.  Medications: Prior to Admission medications   Medication Sig Start Date End Date Taking? Authorizing Provider  HUMIRA PEN 40 MG/0.4ML PNKT Inject 40 mg into the skin every 14 (fourteen) days. 09/26/21  Yes [provider]  hydroxychloroquine (PLAQUENIL) 200 MG tablet  Take 200 mg by mouth daily. 05/08/19  Yes [provider]  omeprazole  (PRILOSEC) 20 MG capsule Take 20 mg by mouth daily as needed (heartburn/indigestion.).   Yes [provider]  sertraline (ZOLOFT) 100 MG tablet Take 100 mg by mouth daily. 09/25/21  Yes [provider]  acetaminophen (TYLENOL) 500 MG tablet Take 1,000 mg by mouth every 6 (six) hours as needed for moderate pain.    [provider]  ibuprofen (ADVIL) 200 MG tablet Take 400 mg by mouth every 6 (six) hours as needed for moderate pain.    [provider]  predniSONE (DELTASONE) 5 MG tablet Take 5 mg by mouth daily with breakfast. Patient not taking: Reported on 10/06/2021    [provider]  sertraline (ZOLOFT) 50 MG tablet Take 75 mg by mouth at bedtime. Patient not taking: Reported on 10/06/2021 05/23/19   [provider]     History reviewed. No pertinent family history.  Social History   Socioeconomic History   Marital status: Married    Spouse name: Not on file   Number of children: Not on file   Years of education: Not on file   Highest education level: Not on file  Occupational History   Not on file  Tobacco Use   Smoking status: Former    Types: Cigarettes    Quit date: 12/14/2013    Years since quitting: 7.8   Smokeless tobacco: Never  Vaping Use   Vaping Use: Never used  Substance and Sexual Activity   Alcohol use: Yes    Comment: occassional beer   Drug use: Never   Sexual activity: Not on file  Other Topics Concern   Not on file  Social History Narrative   Not on file   Social Determinants of Health   Financial Resource Strain: Not on file  Food Insecurity: Not on file  Transportation Needs: Not on file  Physical Activity: Not on file  Stress: Not on file  Social Connections: Not on file      Review of Systems see above; denies CP,dyspnea, cough, abd pain,or bleeding  Vital Signs: BP 128/71   Pulse (!) 109   Temp 98.6 F (37 C) (Oral)   Resp 20   SpO2 97%     Physical Exam awake/alert; chest- CTA  bilat; heart- tachy but reg rhythm; abd- soft,+BS,intact urostomy with yellow urine,NT; no sig LE edema; left CVA tenderness  Imaging: DG Chest 2 View  Result Date: 10/04/2021 CLINICAL DATA:  History of bladder cancer. Eighteen month follow-up. EXAM: CHEST - 2 VIEW COMPARISON:  Chest x-rays dated 09/30/2020 and 05/13/2014. FINDINGS: Heart size and mediastinal contours are within normal limits. Lungs are clear. No pulmonary nodule or mass. No evidence of pneumonia or pulmonary edema. No pleural effusion or pneumothorax is seen. Osseous structures about the chest are unremarkable. IMPRESSION: Normal chest x-ray. Electronically Signed   By: Franki Cabot M.D.   On: 10/04/2021 08:22    Labs:  CBC: Recent Labs    10/06/21 1144  WBC 8.7  HGB 13.2  HCT 39.3  PLT 159    COAGS: Recent Labs    10/06/21 1156  INR 1.2    BMP: Recent Labs    10/06/21 1144  NA 133*  K 3.8  CL 104  CO2 21*  GLUCOSE 92  BUN 24*  CALCIUM 9.3  CREATININE 1.37*  GFRNONAA 44*    LIVER FUNCTION TESTS: No results for input(s): "BILITOT", "AST", "ALT", "ALKPHOS", "PROT", "ALBUMIN"  in the last 8760 hours.  TUMOR MARKERS: No results for input(s): "AFPTM", "CEA", "CA199", "CHROMGRNA" in the last 8760 hours.  Assessment and Plan: 61 y.o. female with past medical history of anxiety,GERD, rheumatoid arthritis, bladder cancer with prior cystectomy/ileal conduit creation in 2022.  Patient was seen by urology today in office where CT scan revealed left renal stone with signs of obstruction.  Patient has had recent left-sided flank pain along with nausea ,vomiting ,chills ,fever.  She was referred to the ED for further management and request now received for left nephrostomy/nephroureteral catheter placement.  She is currently afebrile but tachycardic.  BP okay, on IV Rocephin; PT/INR normal, hemoglobin 10.9, blood/urine cultures pending.Imaging studies have been reviewed by Dr. Laurence Ferrari. Risks and benefits of left  PCN placement was discussed with the patient /spouse including, but not limited to, infection, bleeding, significant bleeding causing loss or decrease in renal function or damage to adjacent structures.   All of the patient's questions were answered, patient is agreeable to proceed.  Consent signed and in chart. Procedure planned for today.       Thank you for this interesting consult.  I greatly enjoyed meeting Sarah Rush and look forward to participating in their care.  A copy of this report was sent to the requesting provider on this date.  Electronically Signed: D. Rowe Robert, PA-C 10/06/2021, 1:59 PM   I spent a total of 25 minutes    in face to face in clinical consultation, greater than 50% of which was counseling/coordinating care for left percutaneous nephrostomy/possible nephroureteral catheter placement

## 2021-10-07 DIAGNOSIS — N390 Urinary tract infection, site not specified: Secondary | ICD-10-CM | POA: Diagnosis not present

## 2021-10-07 LAB — BLOOD CULTURE ID PANEL (REFLEXED) - BCID2

## 2021-10-07 LAB — CBC
HCT: 31.9 % — ABNORMAL LOW (ref 36.0–46.0)
Hemoglobin: 10.7 g/dL — ABNORMAL LOW (ref 12.0–15.0)
MCH: 30.1 pg (ref 26.0–34.0)
MCHC: 33.5 g/dL (ref 30.0–36.0)
MCV: 89.9 fL (ref 80.0–100.0)
Platelets: 120 10*3/uL — ABNORMAL LOW (ref 150–400)
RBC: 3.55 MIL/uL — ABNORMAL LOW (ref 3.87–5.11)
RDW: 13.9 % (ref 11.5–15.5)
WBC: 13 10*3/uL — ABNORMAL HIGH (ref 4.0–10.5)
nRBC: 0 % (ref 0.0–0.2)

## 2021-10-07 LAB — COMPREHENSIVE METABOLIC PANEL
ALT: 16 U/L (ref 0–44)
AST: 19 U/L (ref 15–41)
Albumin: 3.4 g/dL — ABNORMAL LOW (ref 3.5–5.0)
Alkaline Phosphatase: 69 U/L (ref 38–126)
Anion gap: 8 (ref 5–15)
BUN: 23 mg/dL (ref 8–23)
CO2: 24 mmol/L (ref 22–32)
Calcium: 8.4 mg/dL — ABNORMAL LOW (ref 8.9–10.3)
Chloride: 104 mmol/L (ref 98–111)
Creatinine, Ser: 1.41 mg/dL — ABNORMAL HIGH (ref 0.44–1.00)
GFR, Estimated: 42 mL/min — ABNORMAL LOW (ref >60)
Glucose, Bld: 94 mg/dL (ref 70–99)
Potassium: 3.4 mmol/L — ABNORMAL LOW (ref 3.5–5.1)
Sodium: 136 mmol/L (ref 135–145)
Total Bilirubin: 0.8 mg/dL (ref 0.3–1.2)
Total Protein: 6.3 g/dL — ABNORMAL LOW (ref 6.5–8.1)

## 2021-10-07 MED ORDER — SERTRALINE HCL 100 MG PO TABS
100.0000 mg | ORAL_TABLET | Freq: Every day | ORAL | Status: DC
  Administered 2021-10-07 – 2021-10-08 (×2): 100 mg via ORAL
  Filled 2021-10-07 (×2): qty 1

## 2021-10-07 MED ORDER — POTASSIUM CHLORIDE CRYS ER 20 MEQ PO TBCR
40.0000 meq | EXTENDED_RELEASE_TABLET | Freq: Once | ORAL | Status: AC
  Administered 2021-10-07: 40 meq via ORAL
  Filled 2021-10-07: qty 2

## 2021-10-07 NOTE — Progress Notes (Signed)
Mobility Specialist - Progress Note  Pre-mobility: 120 bpm HR, 92% SpO2 During mobility: 130 bpm HR, 88% SpO2 Post-mobility: 113 bpm HR, 94% SPO2  10/07/21 1450  Mobility  Activity Ambulated independently in hallway  Level of Assistance Standby assist, set-up cues, supervision of patient - no hands on  Assistive Device None  Distance Ambulated (ft) 350 ft  Activity Response Tolerated well  $Mobility charge 1 Mobility   Pt was found in bed and agreeable to mobilize. Was a little unsteady at the beginning but was able to steady herself. At EOS was left in bed with all necessities in reach.  Ferd Hibbs Mobility Specialist

## 2021-10-07 NOTE — Progress Notes (Signed)
PHARMACY - PHYSICIAN COMMUNICATION CRITICAL VALUE ALERT - BLOOD CULTURE IDENTIFICATION (BCID)  Sarah Rush is an 61 y.o. female who presented to [facility] on [facility]/06/2021 with a chief complaint of  ureteral calculus   Assessment:  EColi 1/4, no resistance  Name of physician (or Provider) Contacted: Oypd  Current antibiotics: ceftriaxone 2gm IV q24h  Changes to prescribed antibiotics recommended:  Patient is on recommended antibiotics - No changes needed  Results for orders placed or performed during the hospital encounter of 10/06/21  Blood Culture ID Panel (Reflexed) (Collected: 10/06/2021 11:44 AM)  Result Value Ref Range   Enterococcus faecalis NOT DETECTED NOT DETECTED   Enterococcus Faecium NOT DETECTED NOT DETECTED   Listeria monocytogenes NOT DETECTED NOT DETECTED   Staphylococcus species NOT DETECTED NOT DETECTED   Staphylococcus aureus (BCID) NOT DETECTED NOT DETECTED   Staphylococcus epidermidis NOT DETECTED NOT DETECTED   Staphylococcus lugdunensis NOT DETECTED NOT DETECTED   Streptococcus species NOT DETECTED NOT DETECTED   Streptococcus agalactiae NOT DETECTED NOT DETECTED   Streptococcus pneumoniae NOT DETECTED NOT DETECTED   Streptococcus pyogenes NOT DETECTED NOT DETECTED   A.calcoaceticus-baumannii NOT DETECTED NOT DETECTED   Bacteroides fragilis NOT DETECTED NOT DETECTED   Enterobacterales DETECTED (A) NOT DETECTED   Enterobacter cloacae complex NOT DETECTED NOT DETECTED   Escherichia coli DETECTED (A) NOT DETECTED   Klebsiella aerogenes NOT DETECTED NOT DETECTED   Klebsiella oxytoca NOT DETECTED NOT DETECTED   Klebsiella pneumoniae NOT DETECTED NOT DETECTED   Proteus species NOT DETECTED NOT DETECTED   Salmonella species NOT DETECTED NOT DETECTED   Serratia marcescens NOT DETECTED NOT DETECTED   Haemophilus influenzae NOT DETECTED NOT DETECTED   Neisseria meningitidis NOT DETECTED NOT DETECTED   Pseudomonas aeruginosa NOT DETECTED NOT DETECTED    Stenotrophomonas maltophilia NOT DETECTED NOT DETECTED   Candida albicans NOT DETECTED NOT DETECTED   Candida auris NOT DETECTED NOT DETECTED   Candida glabrata NOT DETECTED NOT DETECTED   Candida krusei NOT DETECTED NOT DETECTED   Candida parapsilosis NOT DETECTED NOT DETECTED   Candida tropicalis NOT DETECTED NOT DETECTED   Cryptococcus neoformans/gattii NOT DETECTED NOT DETECTED   CTX-M ESBL NOT DETECTED NOT DETECTED   Carbapenem resistance IMP NOT DETECTED NOT DETECTED   Carbapenem resistance KPC NOT DETECTED NOT DETECTED   Carbapenem resistance NDM NOT DETECTED NOT DETECTED   Carbapenem resist OXA 48 LIKE NOT DETECTED NOT DETECTED   Carbapenem resistance VIM NOT DETECTED NOT DETECTED    Dolly Rias RPh 10/07/2021, 2:41 AM

## 2021-10-07 NOTE — Plan of Care (Signed)

## 2021-10-07 NOTE — Progress Notes (Signed)
Subjective: Feeling much better today.  Complains of mild left flank pain, but denies nausea or vomiting  Objective: Vital signs in last 24 hours: Temp:  [98.6 F (37 C)-100.5 F (38.1 C)] 98.9 F (37.2 C) (09/09 0640) Pulse Rate:  [84-160] 84 (09/09 0640) Resp:  [15-27] 18 (09/09 0640) BP: (98-148)/(53-109) 104/53 (09/09 0640) SpO2:  [91 %-99 %] 96 % (09/09 0640) Weight:  [68.8 kg] 68.8 kg (09/08 1457)  Intake/Output from previous day: 09/08 0701 - 09/09 0700 In: 2865.9 [I.V.:1765.9; IV Piggyback:1100] Out: 1125 [Urine:1125]  Intake/Output this shift: Total I/O In: 240 [P.O.:240] Out: 250 [Urine:250]  Physical Exam:  General: Alert and oriented Left PCN in place and draining slightly blood-tinged urine  Lab Results: Recent Labs    10/06/21 1144 10/07/21 0512  HGB 13.2 10.7*  HCT 39.3 31.9*   BMET Recent Labs    10/06/21 1144 10/07/21 0512  NA 133* 136  K 3.8 3.4*  CL 104 104  CO2 21* 24  GLUCOSE 92 94  BUN 24* 23  CREATININE 1.37* 1.41*  CALCIUM 9.3 8.4*     Studies/Results: IR NEPHROSTOMY PLACEMENT LEFT  Result Date: 10/06/2021 INDICATION: 61 year old female with a history of bladder cancer status post cystectomy with urinary diversion. She is currently admitted with an obstructing stone in the mid left ureter and secondary hydronephrosis. She requires percutaneous nephrostomy tube placement. An attempt will be made to place a nephroureteral tube beyond the stone if possible. EXAM: IR NEPHROSTOMY PLACEMENT LEFT COMPARISON:  None Available. MEDICATIONS: Patient received IV Rocephin earlier today. No additional prophylaxis administered. ANESTHESIA/SEDATION: Fentanyl 100 mcg IV; Versed 2 mg IV Moderate Sedation Time:  15 minutes The patient's vital signs and level of consciousness continuously monitored during the procedure by the interventional radiology nurse under my direct supervision. CONTRAST:  54m OMNIPAQUE IOHEXOL 300 MG/ML SOLN - administered into the  collecting system(s) FLUOROSCOPY: Radiation exposure index: 50 mGy reference air kerma COMPLICATIONS: None immediate. TECHNIQUE: The procedure, risks, benefits, and alternatives were explained to the patient. Questions regarding the procedure were encouraged and answered. The patient understands and consents to the procedure. The left flank was prepped with chlorhexidine in a sterile fashion, and a sterile drape was applied covering the operative field. A sterile gown and sterile gloves were used for the procedure. Local anesthesia was provided with 1% Lidocaine. The left flank was interrogated with ultrasound and the left kidney identified. The kidney is hydronephrotic. A suitable access site on the skin overlying the lower pole, posterior calix was identified. After local mg anesthesia was achieved, a small skin nick was made with an 11 blade scalpel. A 21 gauge Accustick needle was then advanced under direct sonographic guidance into the lower pole of the left kidney. A 0.018 inch wire was advanced under fluoroscopic guidance into the left renal collecting system. The Accustick sheath was then advanced over the wire and a 0.018 system exchanged for a 0.035 system. Gentle hand injection of contrast material confirms placement of the sheath within the renal collecting system. There is moderate hydronephrosis and complete obstruction of the ureter in the mid segment. Both collecting systems were opacified with contrast material which has been excreted following the contrast enhanced CT scan performed earlier today. A road runner wire was successfully navigated past the obstructing stone and into the ileum. However, a 5 French catheter could not be advanced over the wire past the stone. Given the significant obstruction and edema at this time, the decision was made to proceed with  placement of a simple nephrostomy tube in the renal pelvis. The tract from the scan into the renal collecting system was then dilated  serially to 10-French. A 10-French Cook all-purpose drain was then placed and positioned under fluoroscopic guidance. The locking loop is well formed within the left renal pelvis. The catheter was secured to the skin with 2-0 Prolene and a sterile bandage was placed. Catheter was left to gravity bag drainage. IMPRESSION: Successful placement of a left 10 French percutaneous nephrostomy tube. Electronically Signed   By: Jacqulynn Cadet M.D.   On: 10/06/2021 16:44    Assessment/Plan: 61 year old female with a history of high-grade bladder cancer, status post cystectomy with ileal conduit urinary diversion in 2022.  Currently being treated for likely urosepsis secondary to an obstructing left mid ureteral stone, status post left PCN placement on 10/06/2021  -Continue empiric antibiotic coverage with Rocephin.  Cultures are pending -IR is planning to place an internal stent at some point next week.  I do not believe she needs to be an inpatient for that procedure -Plan for definitive stone treatment in the next 2 to 3 weeks with Dr. Tresa Moore   LOS: 1 day   Ellison Hughs, MD [facility] Pager: [phone]  10/07/2021, 9:42 AM

## 2021-10-07 NOTE — Progress Notes (Signed)
PROGRESS NOTE    Sarah Rush  DUK:[id number] DOB: 10-Jan-1961 DOA: 10/06/2021 PCP: Danae Orleans, MD    Brief Narrative:  Sarah Rush is a 61 y.o. female with medical history significant of anxiety, bladder cancer, history of complete cystectomy and right ileal conduit, GERD, rheumatoid arthritis who woke up this morning with right flank pain, low-grade fever, fatigue, malaise, nausea and vomiting. No sore throat, rhinorrhea, dyspnea, wheezing or hemoptysis.found to have sepsis due to UTI   Assessment and Plan: Bladder cancer [facility]) Status post cystectomy and right ileal conduit   Complicated UTI (urinary tract infection) -status post left PCN placement on 10/06/2021 Urology: Continue empiric antibiotic coverage with Rocephin.  Cultures are pending -IR is planning to place an internal stent at some point next week.  I do not believe she needs to be an inpatient for that procedure -Plan for definitive stone treatment in the next 2 to 3 weeks with Dr. Tresa Moore   E coli bacteremia -IV abx -await culture    AKI (acute kidney injury) ([place]) due to Dehydration Due to:  Nausea and vomiting Associated with:   Hyponatremia -improved with IVF     Rheumatoid arthritis, seropositive ([place]) -Resume hydroxychloroquine upon d/c Follow-up with rheumatology for Humira injections.     GERD (gastroesophageal reflux disease) As needed PPI or H2 blocker.     Vitamin D deficiency Continue supplementation at home.  Hypokalemia -replete  Placed on O2 overnight-- added incentive spirometry and wean to RA Ambulate patient    DVT prophylaxis:     Code Status: Full Code Family Communication:   Disposition Plan:  Level of care: Telemetry Status is: Inpatient Remains inpatient appropriate because: IV abx    Consultants:  urology   Subjective: Placed on O2 overnight  Objective: Vitals:   10/06/21 2357 10/07/21 0300 10/07/21 0600 10/07/21 0640  BP: 114/64 124/66  (!) 104/53   Pulse: 93 (!) 103  84  Resp: '16 17  18  '$ Temp: (!) 100.4 F (38 C) (!) 100.5 F (38.1 C) 99.3 F (37.4 C) 98.9 F (37.2 C)  TempSrc: Oral Oral  Oral  SpO2: 98%   96%  Weight:      Height:        Intake/Output Summary (Last 24 hours) at 10/07/2021 1102 Last data filed at 10/07/2021 0816 Gross per 24 hour  Intake 3105.86 ml  Output 1375 ml  Net 1730.86 ml   Filed Weights   10/06/21 1457  Weight: 68.8 kg    Examination:   General: Appearance:     Overweight female in no acute distress     Lungs:     respirations unlabored, atelectasis at bases  Heart:    Normal heart rate.    MS:   All extremities are intact.    Neurologic:   Awake, alert, oriented x 3. No apparent focal neurological           defect.        Data Reviewed: I have personally reviewed following labs and imaging studies  CBC: Recent Labs  Lab 10/06/21 1144 10/07/21 0512  WBC 8.7 13.0*  NEUTROABS 7.9*  --   HGB 13.2 10.7*  HCT 39.3 31.9*  MCV 87.9 89.9  PLT 159 376*   Basic Metabolic Panel: Recent Labs  Lab 10/06/21 1144 10/07/21 0512  NA 133* 136  K 3.8 3.4*  CL 104 104  CO2 21* 24  GLUCOSE 92 94  BUN 24* 23  CREATININE 1.37* 1.41*  CALCIUM 9.3  8.4*   GFR: Estimated Creatinine Clearance: 39 mL/min (A) (by C-G formula based on SCr of 1.41 mg/dL (H)). Liver Function Tests: Recent Labs  Lab 10/07/21 0512  AST 19  ALT 16  ALKPHOS 69  BILITOT 0.8  PROT 6.3*  ALBUMIN 3.4*   No results for input(s): "LIPASE", "AMYLASE" in the last 168 hours. No results for input(s): "AMMONIA" in the last 168 hours. Coagulation Profile: Recent Labs  Lab 10/06/21 1156  INR 1.2   Cardiac Enzymes: No results for input(s): "CKTOTAL", "CKMB", "CKMBINDEX", "TROPONINI" in the last 168 hours. BNP (last 3 results) No results for input(s): "PROBNP" in the last 8760 hours. HbA1C: No results for input(s): "HGBA1C" in the last 72 hours. CBG: No results for input(s): "GLUCAP" in the last 168  hours. Lipid Profile: No results for input(s): "CHOL", "HDL", "LDLCALC", "TRIG", "CHOLHDL", "LDLDIRECT" in the last 72 hours. Thyroid Function Tests: No results for input(s): "TSH", "T4TOTAL", "FREET4", "T3FREE", "THYROIDAB" in the last 72 hours. Anemia Panel: No results for input(s): "VITAMINB12", "FOLATE", "FERRITIN", "TIBC", "IRON", "RETICCTPCT" in the last 72 hours. Sepsis Labs: No results for input(s): "PROCALCITON", "LATICACIDVEN" in the last 168 hours.  Recent Results (from the past 240 hour(s))  Culture, blood (Routine X 2) w Reflex to ID Panel     Status: None (Preliminary result)   Collection Time: 10/06/21 11:44 AM   Specimen: BLOOD  Result Value Ref Range Status   Specimen Description   Final    BLOOD LEFT ANTECUBITAL Performed at [facility] [address] [place], [place] [address]    Special Requests   Final    BOTTLES DRAWN AEROBIC AND ANAEROBIC Blood Culture results may not be optimal due to an inadequate volume of blood received in culture bottles Performed at [facility] [address], [place], [place] [address]    Culture  Setup Time   Final    IN BOTH AEROBIC AND ANAEROBIC BOTTLES GRAM NEGATIVE RODS Organism ID to follow CRITICAL RESULT CALLED TO, READ BACK BY AND VERIFIED WITH:  C/ PHARMD E. JACKSON 10/07/21 0235 A. LAFRANCE Performed at [facility], [place] [address], [place], [place] [address]    Culture GRAM NEGATIVE RODS  Final   Report Status PENDING  Incomplete  Culture, blood (Routine X 2) w Reflex to ID Panel     Status: None (Preliminary result)   Collection Time: 10/06/21 11:44 AM   Specimen: BLOOD RIGHT ARM  Result Value Ref Range Status   Specimen Description   Final    BLOOD RIGHT ARM Performed at [facility] [address] [place], [place] [address]    Special Requests   Final    BOTTLES DRAWN AEROBIC AND ANAEROBIC Blood Culture results may not be optimal due to an  inadequate volume of blood received in culture bottles Performed at [facility] [address], [place], [place] [address]    Culture  Setup Time   Final    AEROBIC BOTTLE ONLY GRAM NEGATIVE RODS CRITICAL VALUE NOTED.  VALUE IS CONSISTENT WITH PREVIOUSLY REPORTED AND CALLED VALUE.    Culture   Final    NO GROWTH < 24 HOURS Performed at [facility], [place] [address], [place],  [address]    Report Status PENDING  Incomplete  Blood Culture ID Panel (Reflexed)     Status: Abnormal   Collection Time: 10/06/21 11:44 AM  Result Value Ref Range Status   Enterococcus faecalis NOT  DETECTED NOT DETECTED Final   Enterococcus Faecium NOT DETECTED NOT DETECTED Final   Listeria monocytogenes NOT DETECTED NOT DETECTED Final   Staphylococcus species NOT DETECTED NOT DETECTED Final   Staphylococcus aureus (BCID) NOT DETECTED NOT DETECTED Final   Staphylococcus epidermidis NOT DETECTED NOT DETECTED Final   Staphylococcus lugdunensis NOT DETECTED NOT DETECTED Final   Streptococcus species NOT DETECTED NOT DETECTED Final   Streptococcus agalactiae NOT DETECTED NOT DETECTED Final   Streptococcus pneumoniae NOT DETECTED NOT DETECTED Final   Streptococcus pyogenes NOT DETECTED NOT DETECTED Final   A.calcoaceticus-baumannii NOT DETECTED NOT DETECTED Final   Bacteroides fragilis NOT DETECTED NOT DETECTED Final   Enterobacterales DETECTED (A) NOT DETECTED Final    Comment: Enterobacterales represent a large order of gram negative bacteria, not a single organism. CRITICAL RESULT CALLED TO, READ BACK BY AND VERIFIED WITH:  C/ PHARMD E. JACKSON 10/07/21 0235 A. LAFRANCE    Enterobacter cloacae complex NOT DETECTED NOT DETECTED Final   Escherichia coli DETECTED (A) NOT DETECTED Final    Comment: CRITICAL RESULT CALLED TO, READ BACK BY AND VERIFIED WITH:  C/ PHARMD E. JACKSON 10/07/21 0235 A. LAFRANCE    Klebsiella aerogenes NOT DETECTED NOT DETECTED Final   Klebsiella  oxytoca NOT DETECTED NOT DETECTED Final   Klebsiella pneumoniae NOT DETECTED NOT DETECTED Final   Proteus species NOT DETECTED NOT DETECTED Final   Salmonella species NOT DETECTED NOT DETECTED Final   Serratia marcescens NOT DETECTED NOT DETECTED Final   Haemophilus influenzae NOT DETECTED NOT DETECTED Final   Neisseria meningitidis NOT DETECTED NOT DETECTED Final   Pseudomonas aeruginosa NOT DETECTED NOT DETECTED Final   Stenotrophomonas maltophilia NOT DETECTED NOT DETECTED Final   Candida albicans NOT DETECTED NOT DETECTED Final   Candida auris NOT DETECTED NOT DETECTED Final   Candida glabrata NOT DETECTED NOT DETECTED Final   Candida krusei NOT DETECTED NOT DETECTED Final   Candida parapsilosis NOT DETECTED NOT DETECTED Final   Candida tropicalis NOT DETECTED NOT DETECTED Final   Cryptococcus neoformans/gattii NOT DETECTED NOT DETECTED Final   CTX-M ESBL NOT DETECTED NOT DETECTED Final   Carbapenem resistance IMP NOT DETECTED NOT DETECTED Final   Carbapenem resistance KPC NOT DETECTED NOT DETECTED Final   Carbapenem resistance NDM NOT DETECTED NOT DETECTED Final   Carbapenem resist OXA 48 LIKE NOT DETECTED NOT DETECTED Final   Carbapenem resistance VIM NOT DETECTED NOT DETECTED Final    Comment: Performed at [facility], [address] [place], [place] [address]         Radiology Studies: IR NEPHROSTOMY PLACEMENT LEFT  Result Date: 10/06/2021 INDICATION: 61 year old female with a history of bladder cancer status post cystectomy with urinary diversion. She is currently admitted with an obstructing stone in the mid left ureter and secondary hydronephrosis. She requires percutaneous nephrostomy tube placement. An attempt will be made to place a nephroureteral tube beyond the stone if possible. EXAM: IR NEPHROSTOMY PLACEMENT LEFT COMPARISON:  None Available. MEDICATIONS: Patient received IV Rocephin earlier today. No additional prophylaxis administered. ANESTHESIA/SEDATION:  Fentanyl 100 mcg IV; Versed 2 mg IV Moderate Sedation Time:  15 minutes The patient's vital signs and level of consciousness continuously monitored during the procedure by the interventional radiology nurse under my direct supervision. CONTRAST:  68m OMNIPAQUE IOHEXOL 300 MG/ML SOLN - administered into the collecting system(s) FLUOROSCOPY: Radiation exposure index: 50 mGy reference air kerma COMPLICATIONS: None immediate. TECHNIQUE: The procedure, risks, benefits, and alternatives were explained to the patient. Questions regarding  the procedure were encouraged and answered. The patient understands and consents to the procedure. The left flank was prepped with chlorhexidine in a sterile fashion, and a sterile drape was applied covering the operative field. A sterile gown and sterile gloves were used for the procedure. Local anesthesia was provided with 1% Lidocaine. The left flank was interrogated with ultrasound and the left kidney identified. The kidney is hydronephrotic. A suitable access site on the skin overlying the lower pole, posterior calix was identified. After local mg anesthesia was achieved, a small skin nick was made with an 11 blade scalpel. A 21 gauge Accustick needle was then advanced under direct sonographic guidance into the lower pole of the left kidney. A 0.018 inch wire was advanced under fluoroscopic guidance into the left renal collecting system. The Accustick sheath was then advanced over the wire and a 0.018 system exchanged for a 0.035 system. Gentle hand injection of contrast material confirms placement of the sheath within the renal collecting system. There is moderate hydronephrosis and complete obstruction of the ureter in the mid segment. Both collecting systems were opacified with contrast material which has been excreted following the contrast enhanced CT scan performed earlier today. A road runner wire was successfully navigated past the obstructing stone and into the ileum.  However, a 5 French catheter could not be advanced over the wire past the stone. Given the significant obstruction and edema at this time, the decision was made to proceed with placement of a simple nephrostomy tube in the renal pelvis. The tract from the scan into the renal collecting system was then dilated serially to 10-French. A 10-French Cook all-purpose drain was then placed and positioned under fluoroscopic guidance. The locking loop is well formed within the left renal pelvis. The catheter was secured to the skin with 2-0 Prolene and a sterile bandage was placed. Catheter was left to gravity bag drainage. IMPRESSION: Successful placement of a left 10 French percutaneous nephrostomy tube. Electronically Signed   By: Jacqulynn Cadet M.D.   On: 10/06/2021 16:44        Scheduled Meds:  potassium chloride  40 mEq Oral Once   Continuous Infusions:  cefTRIAXone (ROCEPHIN)  IV 200 mL/hr at 10/06/21 1512   lactated ringers 125 mL/hr at 10/06/21 1524     LOS: 1 day    Time spent: 45 minutes spent on chart review, discussion with nursing staff, consultants, updating family and interview/physical exam; more than 50% of that time was spent in counseling and/or coordination of care.    Geradine Girt, DO [facility] Available via Epic secure chat 7am-7pm After these hours, please refer to coverage provider listed on [email or web address] 10/07/2021, 11:02 AM

## 2021-10-07 NOTE — Progress Notes (Signed)
Referring Physician(s): Dr. Gilford Rile  Supervising Physician: Juliet Rude  Patient Status:  [facility] - In-pt  Chief Complaint: Left ureteral stone with hydronephrosis s/p Left PCN 10/06/21 by Dr. Laurence Ferrari  Subjective: Patient in bed sleepy but awake and able to answer questions. She recently received pain medication. She states she is feeling better. Her husband and daughter are at the bedside.   Allergies: Patient has no known allergies.  Medications: Prior to Admission medications   Medication Sig Start Date End Date Taking? Authorizing Provider  acetaminophen (TYLENOL) 500 MG tablet Take 500-1,000 mg by mouth every 6 (six) hours as needed for mild pain or headache.   Yes [provider]  Cholecalciferol (VITAMIN D3) 25 MCG (1000 UT) CAPS Take 3,000 Units by mouth daily.   Yes [provider]  HUMIRA PEN 40 MG/0.4ML PNKT Inject 40 mg into the skin every 14 (fourteen) days. 09/26/21  Yes [provider]  hydroxychloroquine (PLAQUENIL) 200 MG tablet Take 200 mg by mouth in the morning. 05/08/19  Yes [provider]  ibuprofen (ADVIL) 200 MG tablet Take 400 mg by mouth every 6 (six) hours as needed for mild pain, moderate pain or headache.   Yes [provider]  Multiple Vitamins-Minerals (ONE-A-DAY WOMENS PO) Take 1 tablet by mouth daily with breakfast.   Yes [provider]  PRILOSEC OTC 20 MG tablet Take 20 mg by mouth daily as needed (for reflux symptoms).   Yes [provider]  sertraline (ZOLOFT) 100 MG tablet Take 100 mg by mouth daily. 09/25/21  Yes [provider]     Vital Signs: BP (!) 104/53 (BP Location: Right Arm)   Pulse 84   Temp 98.9 F (37.2 C) (Oral)   Resp 14   Ht '5\' 3"'$  (1.6 m)   Wt 151 lb 10.8 oz (68.8 kg)   SpO2 96%   BMI 26.87 kg/m   Physical Exam Constitutional:      General: She is not in acute distress.    Appearance: She is not ill-appearing.  Pulmonary:     Effort: Pulmonary  effort is normal.  Genitourinary:    Comments: Left flank PCN. Approximately 200 ml of light pink urine in gravity bag. Dressing is clean and dry. Left flank with some tenderness to palpation.  Skin:    General: Skin is warm and dry.  Neurological:     Mental Status: She is oriented to person, place, and time.     Imaging: IR NEPHROSTOMY PLACEMENT LEFT  Result Date: 10/06/2021 INDICATION: 61 year old female with a history of bladder cancer status post cystectomy with urinary diversion. She is currently admitted with an obstructing stone in the mid left ureter and secondary hydronephrosis. She requires percutaneous nephrostomy tube placement. An attempt will be made to place a nephroureteral tube beyond the stone if possible. EXAM: IR NEPHROSTOMY PLACEMENT LEFT COMPARISON:  None Available. MEDICATIONS: Patient received IV Rocephin earlier today. No additional prophylaxis administered. ANESTHESIA/SEDATION: Fentanyl 100 mcg IV; Versed 2 mg IV Moderate Sedation Time:  15 minutes The patient's vital signs and level of consciousness continuously monitored during the procedure by the interventional radiology nurse under my direct supervision. CONTRAST:  67m OMNIPAQUE IOHEXOL 300 MG/ML SOLN - administered into the collecting system(s) FLUOROSCOPY: Radiation exposure index: 50 mGy reference air kerma COMPLICATIONS: None immediate. TECHNIQUE: The procedure, risks, benefits, and alternatives were explained to the patient. Questions regarding the procedure were encouraged and answered. The patient understands and consents to the procedure. The left  flank was prepped with chlorhexidine in a sterile fashion, and a sterile drape was applied covering the operative field. A sterile gown and sterile gloves were used for the procedure. Local anesthesia was provided with 1% Lidocaine. The left flank was interrogated with ultrasound and the left kidney identified. The kidney is hydronephrotic. A suitable access site on the  skin overlying the lower pole, posterior calix was identified. After local mg anesthesia was achieved, a small skin nick was made with an 11 blade scalpel. A 21 gauge Accustick needle was then advanced under direct sonographic guidance into the lower pole of the left kidney. A 0.018 inch wire was advanced under fluoroscopic guidance into the left renal collecting system. The Accustick sheath was then advanced over the wire and a 0.018 system exchanged for a 0.035 system. Gentle hand injection of contrast material confirms placement of the sheath within the renal collecting system. There is moderate hydronephrosis and complete obstruction of the ureter in the mid segment. Both collecting systems were opacified with contrast material which has been excreted following the contrast enhanced CT scan performed earlier today. A road runner wire was successfully navigated past the obstructing stone and into the ileum. However, a 5 French catheter could not be advanced over the wire past the stone. Given the significant obstruction and edema at this time, the decision was made to proceed with placement of a simple nephrostomy tube in the renal pelvis. The tract from the scan into the renal collecting system was then dilated serially to 10-French. A 10-French Cook all-purpose drain was then placed and positioned under fluoroscopic guidance. The locking loop is well formed within the left renal pelvis. The catheter was secured to the skin with 2-0 Prolene and a sterile bandage was placed. Catheter was left to gravity bag drainage. IMPRESSION: Successful placement of a left 10 French percutaneous nephrostomy tube. Electronically Signed   By: Jacqulynn Cadet M.D.   On: 10/06/2021 16:44   DG Chest 2 View  Result Date: 10/04/2021 CLINICAL DATA:  History of bladder cancer. Eighteen month follow-up. EXAM: CHEST - 2 VIEW COMPARISON:  Chest x-rays dated 09/30/2020 and 05/13/2014. FINDINGS: Heart size and mediastinal contours are  within normal limits. Lungs are clear. No pulmonary nodule or mass. No evidence of pneumonia or pulmonary edema. No pleural effusion or pneumothorax is seen. Osseous structures about the chest are unremarkable. IMPRESSION: Normal chest x-ray. Electronically Signed   By: Franki Cabot M.D.   On: 10/04/2021 08:22    Labs:  CBC: Recent Labs    10/06/21 1144 10/07/21 0512  WBC 8.7 13.0*  HGB 13.2 10.7*  HCT 39.3 31.9*  PLT 159 120*    COAGS: Recent Labs    10/06/21 1156  INR 1.2    BMP: Recent Labs    10/06/21 1144 10/07/21 0512  NA 133* 136  K 3.8 3.4*  CL 104 104  CO2 21* 24  GLUCOSE 92 94  BUN 24* 23  CALCIUM 9.3 8.4*  CREATININE 1.37* 1.41*  GFRNONAA 44* 42*    LIVER FUNCTION TESTS: Recent Labs    10/07/21 0512  BILITOT 0.8  AST 19  ALT 16  ALKPHOS 69  PROT 6.3*  ALBUMIN 3.4*    Assessment and Plan:  Left ureteral stone with hydronephrosis s/p Left PCN 10/06/21 by Dr. Laurence Ferrari  700 ml urine output documented in Epic. Drain is functioning as expected. Dressing needs to be changed daily or as needed. Continue to document output.   IR with tentative  plans to place internal stent next week. Urology to schedule as outpatient. Urology has plans for definitive stone treatment in the next 2-3 weeks. The patient and her family are aware of these plans. Drain care teaching done - family knows how to empty gravity bag and keep the site clean and dry.   Please call IR with any questions.   Electronically Signed: Soyla Dryer, AGACNP-BC [phone] 10/07/2021, 2:00 PM   I spent a total of 15 Minutes at the the patient's bedside AND on the patient's hospital floor or unit, greater than 50% of which was counseling/coordinating care for left PCN

## 2021-10-08 DIAGNOSIS — N390 Urinary tract infection, site not specified: Secondary | ICD-10-CM | POA: Diagnosis not present

## 2021-10-08 LAB — HIV ANTIBODY (ROUTINE TESTING W REFLEX): HIV Screen 4th Generation wRfx: NONREACTIVE

## 2021-10-08 LAB — CBC
HCT: 31 % — ABNORMAL LOW (ref 36.0–46.0)
Hemoglobin: 10.1 g/dL — ABNORMAL LOW (ref 12.0–15.0)
MCH: 29.6 pg (ref 26.0–34.0)
MCHC: 32.6 g/dL (ref 30.0–36.0)
MCV: 90.9 fL (ref 80.0–100.0)
Platelets: 120 10*3/uL — ABNORMAL LOW (ref 150–400)
RBC: 3.41 MIL/uL — ABNORMAL LOW (ref 3.87–5.11)
RDW: 13.6 % (ref 11.5–15.5)
WBC: 10.2 10*3/uL (ref 4.0–10.5)
nRBC: 0 % (ref 0.0–0.2)

## 2021-10-08 LAB — URINE CULTURE: Culture: >=100000 — AB

## 2021-10-08 LAB — BASIC METABOLIC PANEL
Anion gap: 6 (ref 5–15)
BUN: 16 mg/dL (ref 8–23)
CO2: 25 mmol/L (ref 22–32)
Calcium: 9 mg/dL (ref 8.9–10.3)
Chloride: 109 mmol/L (ref 98–111)
Creatinine, Ser: 1.11 mg/dL — ABNORMAL HIGH (ref 0.44–1.00)
GFR, Estimated: 57 mL/min — ABNORMAL LOW (ref >60)
Glucose, Bld: 111 mg/dL — ABNORMAL HIGH (ref 70–99)
Potassium: 4.1 mmol/L (ref 3.5–5.1)
Sodium: 140 mmol/L (ref 135–145)

## 2021-10-08 MED ORDER — PANTOPRAZOLE SODIUM 40 MG PO TBEC
40.0000 mg | DELAYED_RELEASE_TABLET | Freq: Every day | ORAL | Status: DC
  Administered 2021-10-08: 40 mg via ORAL
  Filled 2021-10-08: qty 1

## 2021-10-08 MED ORDER — OXYCODONE HCL 5 MG PO TABS: 5.0000 mg | ORAL_TABLET | ORAL | 0 refills | Status: DC | PRN

## 2021-10-08 MED ORDER — ONDANSETRON HCL 4 MG PO TABS: 4.0000 mg | ORAL_TABLET | Freq: Four times a day (QID) | ORAL | 0 refills | Status: DC | PRN

## 2021-10-08 MED ORDER — CEFADROXIL 500 MG PO CAPS: 1000.0000 mg | ORAL_CAPSULE | Freq: Two times a day (BID) | ORAL | 0 refills | Status: DC

## 2021-10-08 MED ORDER — CEFADROXIL 500 MG PO CAPS
1000.0000 mg | ORAL_CAPSULE | Freq: Two times a day (BID) | ORAL | Status: DC
  Administered 2021-10-08: 1000 mg via ORAL
  Filled 2021-10-08: qty 2

## 2021-10-08 NOTE — Progress Notes (Signed)
Subjective: No acute events overnight.  Patient doing well.  Denies nausea or emesis or significant pain. Making adequate urine from both nephrostomy tubes and urostomy. Blood cultures and urine culture growing E. coli.  Continued on IV ceftriaxone  Objective: Vital signs in last 24 hours: Temp:  [98 F (36.7 C)-99.9 F (37.7 C)] 98 F (36.7 C) (09/10 0544) Pulse Rate:  [81-108] 81 (09/10 0544) Resp:  [17-18] 17 (09/10 0544) BP: (104-142)/(59-75) 104/59 (09/10 0544) SpO2:  [88 %-97 %] 88 % (09/10 0544)  Intake/Output from previous day: 09/09 0701 - 09/10 0700 In: 4023.9 [P.O.:640; I.V.:3237.8; IV Piggyback:146.1] Out: 3700 [Urine:3700]  Intake/Output this shift: No intake/output data recorded.  Physical Exam:  General: Alert and oriented Left PCN in place and draining slightly blood-tinged urine  Lab Results: Recent Labs    10/06/21 1144 10/07/21 0512 10/08/21 0427  HGB 13.2 10.7* 10.1*  HCT 39.3 31.9* 31.0*    BMET Recent Labs    10/07/21 0512 10/08/21 0427  NA 136 140  K 3.4* 4.1  CL 104 109  CO2 24 25  GLUCOSE 94 111*  BUN 23 16  CREATININE 1.41* 1.11*  CALCIUM 8.4* 9.0      Studies/Results: IR NEPHROSTOMY PLACEMENT LEFT  Result Date: 10/06/2021 INDICATION: 61 year old female with a history of bladder cancer status post cystectomy with urinary diversion. She is currently admitted with an obstructing stone in the mid left ureter and secondary hydronephrosis. She requires percutaneous nephrostomy tube placement. An attempt will be made to place a nephroureteral tube beyond the stone if possible. EXAM: IR NEPHROSTOMY PLACEMENT LEFT COMPARISON:  None Available. MEDICATIONS: Patient received IV Rocephin earlier today. No additional prophylaxis administered. ANESTHESIA/SEDATION: Fentanyl 100 mcg IV; Versed 2 mg IV Moderate Sedation Time:  15 minutes The patient's vital signs and level of consciousness continuously monitored during the procedure by the  interventional radiology nurse under my direct supervision. CONTRAST:  43m OMNIPAQUE IOHEXOL 300 MG/ML SOLN - administered into the collecting system(s) FLUOROSCOPY: Radiation exposure index: 50 mGy reference air kerma COMPLICATIONS: None immediate. TECHNIQUE: The procedure, risks, benefits, and alternatives were explained to the patient. Questions regarding the procedure were encouraged and answered. The patient understands and consents to the procedure. The left flank was prepped with chlorhexidine in a sterile fashion, and a sterile drape was applied covering the operative field. A sterile gown and sterile gloves were used for the procedure. Local anesthesia was provided with 1% Lidocaine. The left flank was interrogated with ultrasound and the left kidney identified. The kidney is hydronephrotic. A suitable access site on the skin overlying the lower pole, posterior calix was identified. After local mg anesthesia was achieved, a small skin nick was made with an 11 blade scalpel. A 21 gauge Accustick needle was then advanced under direct sonographic guidance into the lower pole of the left kidney. A 0.018 inch wire was advanced under fluoroscopic guidance into the left renal collecting system. The Accustick sheath was then advanced over the wire and a 0.018 system exchanged for a 0.035 system. Gentle hand injection of contrast material confirms placement of the sheath within the renal collecting system. There is moderate hydronephrosis and complete obstruction of the ureter in the mid segment. Both collecting systems were opacified with contrast material which has been excreted following the contrast enhanced CT scan performed earlier today. A road runner wire was successfully navigated past the obstructing stone and into the ileum. However, a 5 French catheter could not be advanced over the wire past the  stone. Given the significant obstruction and edema at this time, the decision was made to proceed with  placement of a simple nephrostomy tube in the renal pelvis. The tract from the scan into the renal collecting system was then dilated serially to 10-French. A 10-French Cook all-purpose drain was then placed and positioned under fluoroscopic guidance. The locking loop is well formed within the left renal pelvis. The catheter was secured to the skin with 2-0 Prolene and a sterile bandage was placed. Catheter was left to gravity bag drainage. IMPRESSION: Successful placement of a left 10 French percutaneous nephrostomy tube. Electronically Signed   By: Jacqulynn Cadet M.D.   On: 10/06/2021 16:44    Assessment/Plan: 61 year old female with a history of high-grade bladder cancer, status post cystectomy with ileal conduit urinary diversion in 2022.  Currently being treated for likely urosepsis secondary to an obstructing left mid ureteral stone, status post left PCN placement on 10/06/2021  -Continue empiric antibiotic coverage with Rocephin.  Cultures are pending -IR is planning to place an internal stent at some point next week.  I do not believe she needs to be an inpatient for that procedure -Plan for definitive stone treatment in the next 2 to 3 weeks with Dr. Tresa Moore   LOS: 2 days   Josph Macho, MD Resident Physician [facility]  10/08/2021, 8:27 AM

## 2021-10-08 NOTE — Discharge Instructions (Addendum)
IR with tentative plans to place internal stent next week. Urology to schedule as outpatient. Urology has plans for definitive stone treatment in the next 2-3 weeks Drain care -  empty gravity bag and keep the site clean and dry

## 2021-10-08 NOTE — TOC CM/SW Note (Signed)
  Transition of Care [facility]) Screening Note   Patient Details  Name: Sarah Rush Date of Birth: November 22, 1960   Transition of Care [facility]) CM/SW Contact:    Ross Ludwig, LCSW Phone Number: 10/08/2021, 12:36 PM    Transition of Care Department [facility]) has reviewed patient and no TOC needs have been identified at this time. We will continue to monitor patient advancement through interdisciplinary progression rounds. If new patient transition needs arise, please place a TOC consult.

## 2021-10-08 NOTE — Plan of Care (Signed)
Plan of care reviewed with the patient and pt expressed an understanding Problem: Education: Goal: Knowledge of General Education information will improve Description: Including pain rating scale, medication(s)/side effects and non-pharmacologic comfort measures Outcome: Progressing   Problem: Education: Goal: Knowledge of General Education information will improve Description: Including pain rating scale, medication(s)/side effects and non-pharmacologic comfort measures Outcome: Progressing   Problem: Health Behavior/Discharge Planning: Goal: Ability to manage health-related needs will improve Outcome: Progressing   Problem: Health Behavior/Discharge Planning: Goal: Ability to manage health-related needs will improve Outcome: Progressing   Problem: Clinical Measurements: Goal: Ability to maintain clinical measurements within normal limits will improve Outcome: Progressing Goal: Will remain free from infection Outcome: Progressing Goal: Diagnostic test results will improve Outcome: Progressing Goal: Respiratory complications will improve Outcome: Progressing Goal: Cardiovascular complication will be avoided Outcome: Progressing   Problem: Clinical Measurements: Goal: Ability to maintain clinical measurements within normal limits will improve Outcome: Progressing   Problem: Clinical Measurements: Goal: Will remain free from infection Outcome: Progressing   Problem: Clinical Measurements: Goal: Diagnostic test results will improve Outcome: Progressing   Problem: Clinical Measurements: Goal: Respiratory complications will improve Outcome: Progressing   Problem: Clinical Measurements: Goal: Cardiovascular complication will be avoided Outcome: Progressing

## 2021-10-08 NOTE — Discharge Summary (Signed)
Physician Discharge Summary  Sarah Rush BTD:[id number] DOB: 16-Jun-1960 DOA: 10/06/2021  PCP: Danae Orleans, MD  Admit date: 10/06/2021 Discharge date: 10/08/2021  Admitted From: home Discharge disposition: home   Recommendations for Outpatient Follow-Up:   IR with tentative plans to place internal stent next week. Urology to schedule as outpatient. Urology has plans for definitive stone treatment in the next 2-3 weeks   Discharge Diagnosis:   Principal Problem:   Complicated UTI (urinary tract infection) Active Problems:   Rheumatoid arthritis, seropositive (HCC)   Bladder cancer (HCC)   Anxiety   Depression   GERD (gastroesophageal reflux disease)   Vitamin D deficiency   AKI (acute kidney injury) ([place])   Dehydration   Nausea and vomiting   Hyponatremia    Discharge Condition: Improved.  Diet recommendation: Regular.  Wound care: None.  Code status: Full.   History of Present Illness:   Sarah Rush is a 61 y.o. female with medical history significant of anxiety, bladder cancer, history of complete cystectomy and right ileal conduit, GERD, rheumatoid arthritis who woke up this morning with right flank pain, low-grade fever, fatigue, malaise, nausea and vomiting. No sore throat, rhinorrhea, dyspnea, wheezing or hemoptysis.  No chest pain, palpitations, diaphoresis, PND, orthopnea or pitting edema of the lower extremities.  No diarrhea, constipation, melena or hematochezia.  No flank pain, dysuria, frequency or hematuria.  No polyuria, polydipsia, polyphagia or blurred vision.   Hospital Course by Problem:   Bladder cancer [facility]) Status post cystectomy and right ileal conduit   Complicated UTI (urinary tract infection) -status post left PCN placement on 10/06/2021 Urology: Continue empiric antibiotic coverage with Rocephin.  Cultures are pending -IR is planning to place an internal stent at some point next week.  I do not believe she needs to be  an inpatient for that procedure -Plan for definitive stone treatment in the next 2 to 3 weeks with Dr. Tresa Moore   E coli bacteremia -IV abx -await culture     AKI (acute kidney injury) ([place]) due to Dehydration Due to:  Nausea and vomiting Associated with:   Hyponatremia -improved with IVF     Rheumatoid arthritis, seropositive ([place]) -Resume hydroxychloroquine upon d/c Follow-up with rheumatology for Humira injections.     GERD (gastroesophageal reflux disease) As needed PPI or H2 blocker.     Vitamin D deficiency Continue supplementation at home.   Hypokalemia -replete    Medical Consultants:   urology   Discharge Exam:   Vitals:   10/07/21 1500 10/08/21 0544  BP:  (!) 104/59  Pulse:  81  Resp:  17  Temp:  98 F (36.7 C)  SpO2: (!) 88% (!) 88%   Vitals:   10/07/21 0745 10/07/21 1340 10/07/21 1500 10/08/21 0544  BP:  (!) 142/75  (!) 104/59  Pulse:  (!) 108  81  Resp: '14 18  17  '$ Temp:  99.9 F (37.7 C)  98 F (36.7 C)  TempSrc:  Oral  Oral  SpO2:  97% (!) 88% (!) 88%  Weight:      Height:        General exam: Appears calm and comfortable.      The results of significant diagnostics from this hospitalization (including imaging, microbiology, ancillary and laboratory) are listed below for reference.     Procedures and Diagnostic Studies:   IR NEPHROSTOMY PLACEMENT LEFT  Result Date: 10/06/2021 INDICATION: 61 year old female with a history of bladder cancer status post cystectomy with urinary  diversion. She is currently admitted with an obstructing stone in the mid left ureter and secondary hydronephrosis. She requires percutaneous nephrostomy tube placement. An attempt will be made to place a nephroureteral tube beyond the stone if possible. EXAM: IR NEPHROSTOMY PLACEMENT LEFT COMPARISON:  None Available. MEDICATIONS: Patient received IV Rocephin earlier today. No additional prophylaxis administered. ANESTHESIA/SEDATION: Fentanyl 100 mcg IV; Versed 2 mg IV  Moderate Sedation Time:  15 minutes The patient's vital signs and level of consciousness continuously monitored during the procedure by the interventional radiology nurse under my direct supervision. CONTRAST:  57m OMNIPAQUE IOHEXOL 300 MG/ML SOLN - administered into the collecting system(s) FLUOROSCOPY: Radiation exposure index: 50 mGy reference air kerma COMPLICATIONS: None immediate. TECHNIQUE: The procedure, risks, benefits, and alternatives were explained to the patient. Questions regarding the procedure were encouraged and answered. The patient understands and consents to the procedure. The left flank was prepped with chlorhexidine in a sterile fashion, and a sterile drape was applied covering the operative field. A sterile gown and sterile gloves were used for the procedure. Local anesthesia was provided with 1% Lidocaine. The left flank was interrogated with ultrasound and the left kidney identified. The kidney is hydronephrotic. A suitable access site on the skin overlying the lower pole, posterior calix was identified. After local mg anesthesia was achieved, a small skin nick was made with an 11 blade scalpel. A 21 gauge Accustick needle was then advanced under direct sonographic guidance into the lower pole of the left kidney. A 0.018 inch wire was advanced under fluoroscopic guidance into the left renal collecting system. The Accustick sheath was then advanced over the wire and a 0.018 system exchanged for a 0.035 system. Gentle hand injection of contrast material confirms placement of the sheath within the renal collecting system. There is moderate hydronephrosis and complete obstruction of the ureter in the mid segment. Both collecting systems were opacified with contrast material which has been excreted following the contrast enhanced CT scan performed earlier today. A road runner wire was successfully navigated past the obstructing stone and into the ileum. However, a 5 French catheter could not be  advanced over the wire past the stone. Given the significant obstruction and edema at this time, the decision was made to proceed with placement of a simple nephrostomy tube in the renal pelvis. The tract from the scan into the renal collecting system was then dilated serially to 10-French. A 10-French Cook all-purpose drain was then placed and positioned under fluoroscopic guidance. The locking loop is well formed within the left renal pelvis. The catheter was secured to the skin with 2-0 Prolene and a sterile bandage was placed. Catheter was left to gravity bag drainage. IMPRESSION: Successful placement of a left 10 French percutaneous nephrostomy tube. Electronically Signed   By: HJacqulynn CadetM.D.   On: 10/06/2021 16:44     Labs:   Basic Metabolic Panel: Recent Labs  Lab 10/06/21 1144 10/07/21 0512 10/08/21 0427  NA 133* 136 140  K 3.8 3.4* 4.1  CL 104 104 109  CO2 21* 24 25  GLUCOSE 92 94 111*  BUN 24* 23 16  CREATININE 1.37* 1.41* 1.11*  CALCIUM 9.3 8.4* 9.0   GFR Estimated Creatinine Clearance: 49.6 mL/min (A) (by C-G formula based on SCr of 1.11 mg/dL (H)). Liver Function Tests: Recent Labs  Lab 10/07/21 0512  AST 19  ALT 16  ALKPHOS 69  BILITOT 0.8  PROT 6.3*  ALBUMIN 3.4*   No results for input(s): "LIPASE", "AMYLASE"  in the last 168 hours. No results for input(s): "AMMONIA" in the last 168 hours. Coagulation profile Recent Labs  Lab 10/06/21 1156  INR 1.2    CBC: Recent Labs  Lab 10/06/21 1144 10/07/21 0512 10/08/21 0427  WBC 8.7 13.0* 10.2  NEUTROABS 7.9*  --   --   HGB 13.2 10.7* 10.1*  HCT 39.3 31.9* 31.0*  MCV 87.9 89.9 90.9  PLT 159 120* 120*   Cardiac Enzymes: No results for input(s): "CKTOTAL", "CKMB", "CKMBINDEX", "TROPONINI" in the last 168 hours. BNP: Invalid input(s): "POCBNP" CBG: No results for input(s): "GLUCAP" in the last 168 hours. D-Dimer No results for input(s): "DDIMER" in the last 72 hours. Hgb A1c No results for  input(s): "HGBA1C" in the last 72 hours. Lipid Profile No results for input(s): "CHOL", "HDL", "LDLCALC", "TRIG", "CHOLHDL", "LDLDIRECT" in the last 72 hours. Thyroid function studies No results for input(s): "TSH", "T4TOTAL", "T3FREE", "THYROIDAB" in the last 72 hours.  Invalid input(s): "FREET3" Anemia work up No results for input(s): "VITAMINB12", "FOLATE", "FERRITIN", "TIBC", "IRON", "RETICCTPCT" in the last 72 hours. Microbiology Recent Results (from the past 240 hour(s))  Urine Culture     Status: Abnormal   Collection Time: 10/06/21 11:30 AM   Specimen: Urine, Clean Catch  Result Value Ref Range Status   Specimen Description   Final    URINE, CLEAN CATCH Performed at [facility], [place] [address] [place], [place] [address]    Special Requests   Final    NONE Performed at [facility], [place] [address] [place], [place] [address]    Culture >=100,000 COLONIES/mL ESCHERICHIA COLI (A)  Final   Report Status 10/08/2021 FINAL  Final   Organism ID, Bacteria ESCHERICHIA COLI (A)  Final      Susceptibility   Escherichia coli - MIC*    AMPICILLIN <=2 SENSITIVE Sensitive     CEFAZOLIN <=4 SENSITIVE Sensitive     CEFEPIME <=0.12 SENSITIVE Sensitive     CEFTRIAXONE <=0.25 SENSITIVE Sensitive     CIPROFLOXACIN <=0.25 SENSITIVE Sensitive     GENTAMICIN <=1 SENSITIVE Sensitive     IMIPENEM <=0.25 SENSITIVE Sensitive     NITROFURANTOIN <=16 SENSITIVE Sensitive     TRIMETH/SULFA <=20 SENSITIVE Sensitive     AMPICILLIN/SULBACTAM <=2 SENSITIVE Sensitive     PIP/TAZO <=4 SENSITIVE Sensitive     * >=100,000 COLONIES/mL ESCHERICHIA COLI  Culture, blood (Routine X 2) w Reflex to ID Panel     Status: Abnormal (Preliminary result)   Collection Time: 10/06/21 11:44 AM   Specimen: BLOOD  Result Value Ref Range Status   Specimen Description   Final    BLOOD LEFT ANTECUBITAL Performed at [place] [address] [place], [place]  [address]    Special Requests   Final    BOTTLES DRAWN AEROBIC AND ANAEROBIC Blood Culture results may not be optimal due to an inadequate volume of blood received in culture bottles Performed at [facility] [address], [place], [place] [address]    Culture  Setup Time   Final    IN BOTH AEROBIC AND ANAEROBIC BOTTLES GRAM NEGATIVE RODS Organism ID to follow CRITICAL RESULT CALLED TO, READ BACK BY AND VERIFIED WITH:  C/ PHARMD E. JACKSON 10/07/21 0235 A. LAFRANCE    Culture (A)  Final    ESCHERICHIA COLI SUSCEPTIBILITIES TO FOLLOW Performed at [facility], [place] [address], [place], [place] [address]    Report Status PENDING  Incomplete  Culture, blood (Routine X 2) w Reflex to ID Panel     Status: None (Preliminary result)   Collection Time: 10/06/21 11:44 AM   Specimen: BLOOD RIGHT ARM  Result Value Ref Range Status   Specimen Description   Final    BLOOD RIGHT ARM Performed at [place] [address], [place], [place] [address]    Special Requests   Final    BOTTLES DRAWN AEROBIC AND ANAEROBIC Blood Culture results may not be optimal due to an inadequate volume of blood received in culture bottles Performed at [place] [address] [place], [place] [address]    Culture  Setup Time   Final    AEROBIC BOTTLE ONLY GRAM NEGATIVE RODS CRITICAL VALUE NOTED.  VALUE IS CONSISTENT WITH PREVIOUSLY REPORTED AND CALLED VALUE. Performed at [facility], [place] [address], [place], [place] [address]    Culture GRAM NEGATIVE RODS  Final   Report Status PENDING  Incomplete  Blood Culture ID Panel (Reflexed)     Status: Abnormal   Collection Time: 10/06/21 11:44 AM  Result Value Ref Range Status   Enterococcus faecalis NOT DETECTED NOT DETECTED Final   Enterococcus Faecium NOT DETECTED NOT DETECTED Final   Listeria monocytogenes NOT DETECTED NOT DETECTED Final   Staphylococcus species NOT DETECTED NOT  DETECTED Final   Staphylococcus aureus (BCID) NOT DETECTED NOT DETECTED Final   Staphylococcus epidermidis NOT DETECTED NOT DETECTED Final   Staphylococcus lugdunensis NOT DETECTED NOT DETECTED Final   Streptococcus species NOT DETECTED NOT DETECTED Final   Streptococcus agalactiae NOT DETECTED NOT DETECTED Final   Streptococcus pneumoniae NOT DETECTED NOT DETECTED Final   Streptococcus pyogenes NOT DETECTED NOT DETECTED Final   A.calcoaceticus-baumannii NOT DETECTED NOT DETECTED Final   Bacteroides fragilis NOT DETECTED NOT DETECTED Final   Enterobacterales DETECTED (A) NOT DETECTED Final    Comment: Enterobacterales represent a large order of gram negative bacteria, not a single organism. CRITICAL RESULT CALLED TO, READ BACK BY AND VERIFIED WITH:  C/ PHARMD E. JACKSON 10/07/21 0235 A. LAFRANCE    Enterobacter cloacae complex NOT DETECTED NOT DETECTED Final   Escherichia coli DETECTED (A) NOT DETECTED Final    Comment: CRITICAL RESULT CALLED TO, READ BACK BY AND VERIFIED WITH:  C/ PHARMD E. JACKSON 10/07/21 0235 A. LAFRANCE    Klebsiella aerogenes NOT DETECTED NOT DETECTED Final   Klebsiella oxytoca NOT DETECTED NOT DETECTED Final   Klebsiella pneumoniae NOT DETECTED NOT DETECTED Final   Proteus species NOT DETECTED NOT DETECTED Final   Salmonella species NOT DETECTED NOT DETECTED Final   Serratia marcescens NOT DETECTED NOT DETECTED Final   Haemophilus influenzae NOT DETECTED NOT DETECTED Final   Neisseria meningitidis NOT DETECTED NOT DETECTED Final   Pseudomonas aeruginosa NOT DETECTED NOT DETECTED Final   Stenotrophomonas maltophilia NOT DETECTED NOT DETECTED Final   Candida albicans NOT DETECTED NOT DETECTED Final   Candida auris NOT DETECTED NOT DETECTED Final   Candida glabrata NOT DETECTED NOT DETECTED Final   Candida krusei NOT DETECTED NOT DETECTED Final   Candida parapsilosis NOT DETECTED NOT DETECTED Final   Candida tropicalis NOT DETECTED NOT DETECTED Final    Cryptococcus neoformans/gattii NOT DETECTED NOT DETECTED Final   CTX-M ESBL NOT DETECTED NOT DETECTED Final   Carbapenem resistance IMP NOT DETECTED NOT DETECTED Final   Carbapenem resistance KPC NOT DETECTED NOT DETECTED Final   Carbapenem resistance NDM NOT DETECTED NOT DETECTED Final   Carbapenem resist OXA  90 LIKE NOT DETECTED NOT DETECTED Final   Carbapenem resistance VIM NOT DETECTED NOT DETECTED Final    Comment: Performed at [facility], [place] [address] [place], [place] [address]     Discharge Instructions:   Discharge Instructions     Diet general   Complete by: As directed    Increase activity slowly   Complete by: As directed    No wound care   Complete by: As directed       Allergies as of 10/08/2021   No Known Allergies      Medication List     STOP taking these medications    ibuprofen 200 MG tablet Commonly known as: ADVIL       TAKE these medications    acetaminophen 500 MG tablet Commonly known as: TYLENOL Take 500-1,000 mg by mouth every 6 (six) hours as needed for mild pain or headache.   cefadroxil 500 MG capsule Commonly known as: DURICEF Take 2 capsules (1,000 mg total) by mouth 2 (two) times daily.   Humira Pen 40 MG/0.4ML Pnkt Generic drug: Adalimumab Inject 40 mg into the skin every 14 (fourteen) days.   hydroxychloroquine 200 MG tablet Commonly known as: PLAQUENIL Take 200 mg by mouth in the morning.   ondansetron 4 MG tablet Commonly known as: ZOFRAN Take 1 tablet (4 mg total) by mouth every 6 (six) hours as needed for nausea.   ONE-A-DAY WOMENS PO Take 1 tablet by mouth daily with breakfast.   oxyCODONE 5 MG immediate release tablet Commonly known as: Oxy IR/ROXICODONE Take 1 tablet (5 mg total) by mouth every 4 (four) hours as needed for breakthrough pain or moderate pain.   PriLOSEC OTC 20 MG tablet Generic drug: omeprazole Take 20 mg by mouth daily as needed (for reflux symptoms).   sertraline 100 MG  tablet Commonly known as: ZOLOFT Take 100 mg by mouth daily.   Vitamin D3 25 MCG (1000 UT) Caps Take 3,000 Units by mouth daily.          Time coordinating discharge: 45 min  Signed:  Geradine Girt DO  [facility] 10/08/2021, 9:23 AM

## 2021-10-09 ENCOUNTER — Other Ambulatory Visit: Payer: Self-pay | Admitting: Urology

## 2021-10-09 LAB — CULTURE, BLOOD (ROUTINE X 2)

## 2021-10-11 NOTE — Progress Notes (Signed)
Sent message, via epic in basket, requesting orders in epic from surgeon.  

## 2021-10-16 NOTE — Progress Notes (Signed)
COVID Vaccine Completed:  Date of COVID positive in last 90 days:  PCP - Danae Orleans, MD Cardiologist -   Chest x-ray - 10/03/21 Epic EKG - 10/06/21 Epic- sinus tachycardia Stress Test -  ECHO -  Cardiac Cath -  Pacemaker/ICD device last checked: Spinal Cord Stimulator:  Bowel Prep -   Sleep Study -  CPAP -   Fasting Blood Sugar -  Checks Blood Sugar _____ times a day  Blood Thinner Instructions: Aspirin Instructions: Last Dose:  Activity level:  Can go up a flight of stairs and perform activities of daily living without stopping and without symptoms of chest pain or shortness of breath.  Able to exercise without symptoms  Unable to go up a flight of stairs without symptoms of     Anesthesia review:   Patient denies shortness of breath, fever, cough and chest pain at PAT appointment  Patient verbalized understanding of instructions that were given to them at the PAT appointment. Patient was also instructed that they will need to review over the PAT instructions again at home before surgery.

## 2021-10-16 NOTE — Patient Instructions (Signed)
SURGICAL WAITING ROOM VISITATION Patients having surgery or a procedure may have no more than 2 support people in the waiting area - these visitors may rotate.   Children under the age of 44 must have an adult with them who is not the patient. Sarah the patient needs to stay at the hospital during part of their recovery, the visitor guidelines for inpatient rooms apply. Pre-op nurse will coordinate an appropriate time for 1 support person to accompany patient in pre-op.  This support person may not rotate.    Please refer to the [facility] website for the visitor guidelines for Inpatients (after your surgery is over and you are in a regular room).    Your procedure is scheduled on: 10/20/21   Report to [facility] : [facility] Main Entrance    Report to admitting at 10:45 AM   Call this number Sarah you have problems the morning of surgery [phone]   Do not eat food or drink fluids:After Midnight.                     Sarah you have questions, please contact your surgeon's office.   FOLLOW BOWEL PREP AND ANY ADDITIONAL PRE OP INSTRUCTIONS YOU RECEIVED FROM YOUR SURGEON'S OFFICE!!!     Oral Hygiene is also important to reduce your risk of infection.                                    Remember - BRUSH YOUR TEETH THE MORNING OF SURGERY WITH YOUR REGULAR TOOTHPASTE   Take these medicines the morning of surgery with A SIP OF WATER: Tylenol, Claritin, Zofran, Prilosec, Sertraline                               You may not have any metal on your body including hair pins, jewelry, and body piercing             Do not wear make-up, lotions, powders, perfumes, or deodorant  Do not wear nail polish including gel and S&S, artificial/acrylic nails, or any other type of covering on natural nails including finger and toenails. Sarah you have artificial nails, gel coating, etc. that needs to be removed by a nail salon please have this removed prior to surgery or surgery may need to be canceled/ delayed Sarah the  surgeon/ anesthesia feels like they are unable to be safely monitored.   Do not shave  48 hours prior to surgery.    Do not bring valuables to the hospital. Sarah Rush.   Contacts, dentures or bridgework may not be worn into surgery.   Bring small overnight bag day of surgery.   DO NOT [place]. PHARMACY WILL DISPENSE MEDICATIONS LISTED ON YOUR MEDICATION LIST TO YOU DURING YOUR ADMISSION [place]!               Please read over the following fact sheets you were given: Sarah Rush   Sarah you received a COVID test during your pre-op visit  it is requested that you wear a mask when out in public, stay away from anyone that may not be feeling well and notify your surgeon  Sarah you develop symptoms. Sarah you test positive for Covid or have been in contact with anyone that has tested positive in the last 10 days please notify you surgeon.     Sarah Rush - Preparing for Surgery Before surgery, you can play an important role.  Because skin is not sterile, your skin needs to be as free of germs as possible.  You can reduce the number of germs on your skin by washing with CHG (chlorahexidine gluconate) soap before surgery.  CHG is an antiseptic cleaner which kills germs and bonds with the skin to continue killing germs even after washing. Please DO NOT use Sarah you have an allergy to CHG or antibacterial soaps.  Sarah your skin becomes reddened/irritated stop using the CHG and inform your nurse when you arrive at Short Stay. Do not shave (including legs and underarms) for at least 48 hours prior to the first CHG shower.  You may shave your face/neck.  Please follow these instructions carefully:  1.  Shower with CHG Soap the night before surgery and the  morning of surgery.  2.  Sarah you choose to wash your hair, wash your hair first as usual with  your normal  shampoo.  3.  After you shampoo, rinse your hair and body thoroughly to remove the shampoo.                             4.  Use CHG as you would any other liquid soap.  You can apply chg directly to the skin and wash.  Gently with a scrungie or clean washcloth.  5.  Apply the CHG Soap to your body ONLY FROM THE NECK DOWN.   Do   not use on face/ open                           Wound or open sores. Avoid contact with eyes, ears mouth and   genitals (private parts).                       Wash face,  Genitals (private parts) with your normal soap.             6.  Wash thoroughly, paying special attention to the area where your    surgery  will be performed.  7.  Thoroughly rinse your body with warm water from the neck down.  8.  DO NOT shower/wash with your normal soap after using and rinsing off the CHG Soap.                9.  Pat yourself dry with a clean towel.            10.  Wear clean pajamas.            11.  Place clean sheets on your bed the night of your first shower and do not  sleep with pets. Day of Surgery : Do not apply any lotions/deodorants the morning of surgery.  Please wear clean clothes to the hospital/surgery center.  FAILURE TO FOLLOW THESE INSTRUCTIONS MAY RESULT IN THE CANCELLATION OF YOUR SURGERY  PATIENT SIGNATURE_________________________________  NURSE SIGNATURE__________________________________  ________________________________________________________________________

## 2021-10-17 ENCOUNTER — Encounter (HOSPITAL_COMMUNITY)
Admission: RE | Admit: 2021-10-17 | Discharge: 2021-10-17 | Disposition: A | Discharge status: Home / Self Care | Payer: BC Managed Care – PPO | Source: Ambulatory Visit | Attending: Urology | Admitting: Urology

## 2021-10-17 ENCOUNTER — Encounter (HOSPITAL_COMMUNITY): Payer: Self-pay

## 2021-10-17 DIAGNOSIS — Z01818 Encounter for other preprocedural examination: Secondary | ICD-10-CM | POA: Insufficient documentation

## 2021-10-17 HISTORY — DX: Personal history of urinary calculi: Z87.442

## 2021-10-20 ENCOUNTER — Observation Stay (HOSPITAL_COMMUNITY)
Admission: RE | Admit: 2021-10-20 | Discharge: 2021-10-21 | Disposition: A | Discharge status: Home / Self Care | Payer: BC Managed Care – PPO | Attending: Urology | Admitting: Urology

## 2021-10-20 ENCOUNTER — Other Ambulatory Visit: Payer: Self-pay

## 2021-10-20 ENCOUNTER — Ambulatory Visit (HOSPITAL_COMMUNITY): Payer: BC Managed Care – PPO | Admitting: Certified Registered"

## 2021-10-20 ENCOUNTER — Encounter (HOSPITAL_COMMUNITY): Payer: Self-pay | Admitting: Urology

## 2021-10-20 ENCOUNTER — Encounter (HOSPITAL_COMMUNITY)
Admission: RE | Disposition: A | Discharge status: Home / Self Care | Payer: Self-pay | Source: Home / Self Care | Attending: Urology

## 2021-10-20 ENCOUNTER — Ambulatory Visit (HOSPITAL_COMMUNITY): Payer: BC Managed Care – PPO

## 2021-10-20 DIAGNOSIS — Z8551 Personal history of malignant neoplasm of bladder: Secondary | ICD-10-CM | POA: Insufficient documentation

## 2021-10-20 DIAGNOSIS — Z936 Other artificial openings of urinary tract status: Secondary | ICD-10-CM | POA: Insufficient documentation

## 2021-10-20 DIAGNOSIS — N201 Calculus of ureter: Secondary | ICD-10-CM | POA: Diagnosis not present

## 2021-10-20 DIAGNOSIS — Z87891 Personal history of nicotine dependence: Secondary | ICD-10-CM | POA: Diagnosis not present

## 2021-10-20 DIAGNOSIS — N132 Hydronephrosis with renal and ureteral calculous obstruction: Secondary | ICD-10-CM | POA: Diagnosis present

## 2021-10-20 SURGERY — URETEROSCOPY, ANTEGRADE
Anesthesia: General | Laterality: Left

## 2021-10-20 MED ORDER — PROPOFOL 10 MG/ML IV BOLUS
INTRAVENOUS | Status: AC
  Filled 2021-10-20: qty 20

## 2021-10-20 MED ORDER — MIDAZOLAM HCL 2 MG/2ML IJ SOLN
INTRAMUSCULAR | Status: DC | PRN
  Administered 2021-10-20: 1 mg via INTRAVENOUS

## 2021-10-20 MED ORDER — ACETAMINOPHEN 500 MG PO TABS
1000.0000 mg | ORAL_TABLET | Freq: Four times a day (QID) | ORAL | Status: DC
  Administered 2021-10-20 – 2021-10-21 (×3): 1000 mg via ORAL
  Filled 2021-10-20 (×3): qty 2

## 2021-10-20 MED ORDER — OMEPRAZOLE 20 MG PO CPDR: 20.0000 mg | DELAYED_RELEASE_CAPSULE | Freq: Every day | ORAL | Status: DC | PRN

## 2021-10-20 MED ORDER — MIDAZOLAM HCL 2 MG/2ML IJ SOLN
INTRAMUSCULAR | Status: AC
  Filled 2021-10-20: qty 2

## 2021-10-20 MED ORDER — SODIUM CHLORIDE 0.9 % IV SOLN: INTRAVENOUS | Status: DC

## 2021-10-20 MED ORDER — PHENYLEPHRINE HCL (PRESSORS) 10 MG/ML IV SOLN
INTRAVENOUS | Status: DC | PRN
  Administered 2021-10-20: 80 ug via INTRAVENOUS

## 2021-10-20 MED ORDER — ORAL CARE MOUTH RINSE: 15.0000 mL | Freq: Once | OROMUCOSAL | Status: AC

## 2021-10-20 MED ORDER — PHENYLEPHRINE HCL-NACL 20-0.9 MG/250ML-% IV SOLN
INTRAVENOUS | Status: DC | PRN
  Administered 2021-10-20: 25 ug/min via INTRAVENOUS

## 2021-10-20 MED ORDER — GENTAMICIN SULFATE 40 MG/ML IJ SOLN
5.0000 mg/kg | INTRAVENOUS | Status: AC
  Administered 2021-10-20: 340 mg via INTRAVENOUS
  Filled 2021-10-20: qty 8.5

## 2021-10-20 MED ORDER — CEPHALEXIN 500 MG PO CAPS: 500.0000 mg | ORAL_CAPSULE | Freq: Two times a day (BID) | ORAL | 0 refills | Status: DC

## 2021-10-20 MED ORDER — ACETAMINOPHEN 160 MG/5ML PO SOLN: 1000.0000 mg | Freq: Once | ORAL | Status: DC | PRN

## 2021-10-20 MED ORDER — FENTANYL CITRATE (PF) 250 MCG/5ML IJ SOLN
INTRAMUSCULAR | Status: DC | PRN
  Administered 2021-10-20: 50 ug via INTRAVENOUS
  Administered 2021-10-20: 100 ug via INTRAVENOUS
  Administered 2021-10-20: 50 ug via INTRAVENOUS

## 2021-10-20 MED ORDER — OXYCODONE HCL 5 MG PO TABS: 5.0000 mg | ORAL_TABLET | Freq: Once | ORAL | Status: DC | PRN

## 2021-10-20 MED ORDER — LORATADINE 10 MG PO TABS: 10.0000 mg | ORAL_TABLET | Freq: Every day | ORAL | Status: DC

## 2021-10-20 MED ORDER — ONDANSETRON HCL 4 MG/2ML IJ SOLN
INTRAMUSCULAR | Status: DC | PRN
  Administered 2021-10-20: 4 mg via INTRAVENOUS

## 2021-10-20 MED ORDER — OXYCODONE HCL 5 MG/5ML PO SOLN: 5.0000 mg | Freq: Once | ORAL | Status: DC | PRN

## 2021-10-20 MED ORDER — PROPOFOL 10 MG/ML IV BOLUS
INTRAVENOUS | Status: DC | PRN
  Administered 2021-10-20: 110 mg via INTRAVENOUS

## 2021-10-20 MED ORDER — OXYCODONE HCL 5 MG PO TABS
5.0000 mg | ORAL_TABLET | ORAL | Status: DC | PRN
  Administered 2021-10-20: 5 mg via ORAL
  Filled 2021-10-20: qty 1

## 2021-10-20 MED ORDER — ACETAMINOPHEN 500 MG PO TABS: 1000.0000 mg | ORAL_TABLET | Freq: Once | ORAL | Status: DC | PRN

## 2021-10-20 MED ORDER — SODIUM CHLORIDE 0.9 % IR SOLN
Status: DC | PRN
  Administered 2021-10-20: 1000 mL
  Administered 2021-10-20: 3000 mL

## 2021-10-20 MED ORDER — DEXAMETHASONE SODIUM PHOSPHATE 10 MG/ML IJ SOLN
INTRAMUSCULAR | Status: DC | PRN
  Administered 2021-10-20: 5 mg via INTRAVENOUS

## 2021-10-20 MED ORDER — LACTATED RINGERS IV SOLN: INTRAVENOUS | Status: DC

## 2021-10-20 MED ORDER — HYDROXYCHLOROQUINE SULFATE 200 MG PO TABS
200.0000 mg | ORAL_TABLET | Freq: Every morning | ORAL | Status: DC
  Filled 2021-10-20: qty 1

## 2021-10-20 MED ORDER — ROCURONIUM BROMIDE 10 MG/ML (PF) SYRINGE
PREFILLED_SYRINGE | INTRAVENOUS | Status: DC | PRN
  Administered 2021-10-20: 40 mg via INTRAVENOUS

## 2021-10-20 MED ORDER — HYDROMORPHONE HCL 1 MG/ML IJ SOLN: 0.5000 mg | INTRAMUSCULAR | Status: DC | PRN

## 2021-10-20 MED ORDER — ACETAMINOPHEN 10 MG/ML IV SOLN: 1000.0000 mg | Freq: Once | INTRAVENOUS | Status: DC | PRN

## 2021-10-20 MED ORDER — OXYCODONE-ACETAMINOPHEN 5-325 MG PO TABS: 1.0000 | ORAL_TABLET | Freq: Four times a day (QID) | ORAL | 0 refills | Status: AC | PRN

## 2021-10-20 MED ORDER — OXYCODONE HCL 5 MG PO TABS
ORAL_TABLET | ORAL | Status: AC
  Administered 2021-10-20: 5 mg via ORAL
  Filled 2021-10-20: qty 1

## 2021-10-20 MED ORDER — FENTANYL CITRATE (PF) 250 MCG/5ML IJ SOLN
INTRAMUSCULAR | Status: AC
  Filled 2021-10-20: qty 5

## 2021-10-20 MED ORDER — SERTRALINE HCL 100 MG PO TABS: 100.0000 mg | ORAL_TABLET | Freq: Every day | ORAL | Status: DC

## 2021-10-20 MED ORDER — CHLORHEXIDINE GLUCONATE 0.12 % MT SOLN: 15.0000 mL | Freq: Once | OROMUCOSAL | Status: DC

## 2021-10-20 MED ORDER — SUGAMMADEX SODIUM 200 MG/2ML IV SOLN
INTRAVENOUS | Status: DC | PRN
  Administered 2021-10-20: 300 mg via INTRAVENOUS

## 2021-10-20 MED ORDER — IOHEXOL 300 MG/ML  SOLN
INTRAMUSCULAR | Status: DC | PRN
  Administered 2021-10-20: 10 mL

## 2021-10-20 MED ORDER — EPHEDRINE SULFATE-NACL 50-0.9 MG/10ML-% IV SOSY
PREFILLED_SYRINGE | INTRAVENOUS | Status: DC | PRN
  Administered 2021-10-20: 10 mg via INTRAVENOUS

## 2021-10-20 MED ORDER — SODIUM CHLORIDE (PF) 0.9 % IJ SOLN
INTRAMUSCULAR | Status: AC
  Filled 2021-10-20: qty 10

## 2021-10-20 MED ORDER — FENTANYL CITRATE PF 50 MCG/ML IJ SOSY: 25.0000 ug | PREFILLED_SYRINGE | INTRAMUSCULAR | Status: DC | PRN

## 2021-10-20 MED ORDER — CHLORHEXIDINE GLUCONATE 0.12 % MT SOLN
15.0000 mL | Freq: Once | OROMUCOSAL | Status: AC
  Administered 2021-10-20: 15 mL via OROMUCOSAL

## 2021-10-20 MED ORDER — LIDOCAINE 2% (20 MG/ML) 5 ML SYRINGE
INTRAMUSCULAR | Status: DC | PRN
  Administered 2021-10-20: 40 mg via INTRAVENOUS

## 2021-10-20 SURGICAL SUPPLY — 76 items
AGENT HMST KT MTR STRL THRMB (HEMOSTASIS)
APL PRP STRL LF DISP 70% ISPRP (MISCELLANEOUS) ×1
APL SKNCLS STERI-STRIP NONHPOA (GAUZE/BANDAGES/DRESSINGS) ×1
BAG COUNTER SPONGE SURGICOUNT (BAG) IMPLANT
BAG DRN RND TRDRP ANRFLXCHMBR (UROLOGICAL SUPPLIES) ×1
BAG SPNG CNTER NS LX DISP (BAG)
BAG URINE DRAIN 2000ML AR STRL (UROLOGICAL SUPPLIES) IMPLANT
BAG URO CATCHER STRL LF (MISCELLANEOUS) IMPLANT
BASKET LASER NITINOL 1.9FR (BASKET) IMPLANT
BASKET ZERO TIP NITINOL 2.4FR (BASKET) IMPLANT
BENZOIN TINCTURE PRP APPL 2/3 (GAUZE/BANDAGES/DRESSINGS) ×1 IMPLANT
BLADE SURG 15 STRL LF DISP TIS (BLADE) ×1 IMPLANT
BLADE SURG 15 STRL SS (BLADE) ×1
BSKT STON RTRVL 120 1.9FR (BASKET)
BSKT STON RTRVL ZERO TP 2.4FR (BASKET)
CATH FOLEY 2W COUNCIL 20FR 5CC (CATHETERS) IMPLANT
CATH FOLEY 2WAY SLVR  5CC 16FR (CATHETERS)
CATH FOLEY 2WAY SLVR 5CC 16FR (CATHETERS) ×1 IMPLANT
CATH MULTI PURPOSE 16FR DRAIN (CATHETERS) IMPLANT
CATH ROBINSON RED A/P 20FR (CATHETERS) IMPLANT
CATH ULTRATHANE 14FR (CATHETERS) IMPLANT
CATH URETL OPEN END 6FR 70 (CATHETERS) IMPLANT
CATH UROLOGY TORQUE 40 (MISCELLANEOUS) IMPLANT
CATH X-FORCE N30 NEPHROSTOMY (TUBING) IMPLANT
CHLORAPREP W/TINT 26 (MISCELLANEOUS) ×2 IMPLANT
DRAPE C-ARM 42X120 X-RAY (DRAPES) ×1 IMPLANT
DRAPE LINGEMAN PERC (DRAPES) ×1 IMPLANT
DRAPE SHEET LG 3/4 BI-LAMINATE (DRAPES) IMPLANT
DRAPE SURG IRRIG POUCH 19X23 (DRAPES) ×1 IMPLANT
DRSG TEGADERM 4X4.75 (GAUZE/BANDAGES/DRESSINGS) IMPLANT
DRSG TEGADERM 8X12 (GAUZE/BANDAGES/DRESSINGS) ×2 IMPLANT
GAUZE PAD ABD 8X10 STRL (GAUZE/BANDAGES/DRESSINGS) ×2 IMPLANT
GAUZE SPONGE 4X4 12PLY STRL (GAUZE/BANDAGES/DRESSINGS) ×1 IMPLANT
GLOVE SURG LX STRL 7.5 STRW (GLOVE) ×1 IMPLANT
GOWN SRG XL LVL 4 BRTHBL STRL (GOWNS) ×1 IMPLANT
GOWN STRL NON-REIN XL LVL4 (GOWNS)
GUIDEWIRE AMPLAZ .035X145 (WIRE) ×2 IMPLANT
GUIDEWIRE ANG ZIPWIRE 038X150 (WIRE) ×1 IMPLANT
GUIDEWIRE STR DUAL SENSOR (WIRE) IMPLANT
IV SET EXTENSION CATH 6 NF (IV SETS) IMPLANT
KIT BASIN OR (CUSTOM PROCEDURE TRAY) ×1 IMPLANT
KIT PROBE 340X3.4XDISP GRN (MISCELLANEOUS) IMPLANT
KIT PROBE TRILOGY 3.4X340 (MISCELLANEOUS)
KIT PROBE TRILOGY 3.9X350 (MISCELLANEOUS) IMPLANT
KIT TURNOVER KIT A (KITS) IMPLANT
LASER FIB FLEXIVA PULSE ID 365 (Laser) IMPLANT
LASER FIB FLEXIVA PULSE ID 550 (Laser) IMPLANT
LASER FIB FLEXIVA PULSE ID 910 (Laser) IMPLANT
MANIFOLD NEPTUNE II (INSTRUMENTS) ×1 IMPLANT
NDL TROCAR 18X15 ECHO (NEEDLE) IMPLANT
NDL TROCAR 18X20 (NEEDLE) IMPLANT
NEEDLE TROCAR 18X15 ECHO (NEEDLE) IMPLANT
NEEDLE TROCAR 18X20 (NEEDLE) IMPLANT
NS IRRIG 1000ML POUR BTL (IV SOLUTION) ×1 IMPLANT
PACK CYSTO (CUSTOM PROCEDURE TRAY) IMPLANT
SHEATH NAVIGATOR HD 12/14X28 (SHEATH) IMPLANT
SHEATH PEELAWAY SET 9 (SHEATH) IMPLANT
SPONGE T-LAP 4X18 ~~LOC~~+RFID (SPONGE) ×1 IMPLANT
SURGIFLO W/THROMBIN 8M KIT (HEMOSTASIS) IMPLANT
SUT SILK 2 0 30  PSL (SUTURE) ×1
SUT SILK 2 0 30 PSL (SUTURE) ×1 IMPLANT
SUT VIC AB 2-0 CT1 27 (SUTURE)
SUT VIC AB 2-0 CT1 TAPERPNT 27 (SUTURE) IMPLANT
SYR 10ML LL (SYRINGE) ×1 IMPLANT
SYR 20ML LL LF (SYRINGE) ×2 IMPLANT
SYR 50ML LL SCALE MARK (SYRINGE) ×1 IMPLANT
TOWEL OR 17X26 10 PK STRL BLUE (TOWEL DISPOSABLE) ×1 IMPLANT
TRACTIP FLEXIVA PULS ID 200XHI (Laser) IMPLANT
TRACTIP FLEXIVA PULSE ID 200 (Laser)
TRAY FOLEY MTR SLVR 16FR STAT (SET/KITS/TRAYS/PACK) ×1 IMPLANT
TUBE CONNECTING VINYL 14FR 30C (TUBING) IMPLANT
TUBING CONNECTING 10 (TUBING) ×2 IMPLANT
TUBING STONE CATCHER TRILOGY (MISCELLANEOUS) IMPLANT
TUBING UROLOGY SET (TUBING) IMPLANT
WATER STERILE IRR 1000ML POUR (IV SOLUTION) ×1 IMPLANT
WATER STERILE IRR 3000ML UROMA (IV SOLUTION) ×1 IMPLANT

## 2021-10-20 NOTE — Anesthesia Preprocedure Evaluation (Signed)
Anesthesia Evaluation  Patient identified by MRN, date of birth, ID band Patient awake    Reviewed: Allergy & Precautions, NPO status , Patient's Chart, lab work & pertinent test results  History of Anesthesia Complications Negative for: history of anesthetic complications  Airway Mallampati: I  TM Distance: >3 FB Neck ROM: Full    Dental  (+) Edentulous Upper, Edentulous Lower, Dental Advisory Given   Pulmonary former smoker,    Pulmonary exam normal        Cardiovascular negative cardio ROS Normal cardiovascular exam     Neuro/Psych PSYCHIATRIC DISORDERS Anxiety Depression negative neurological ROS     GI/Hepatic Neg liver ROS, GERD  Medicated and Controlled,  Endo/Other  negative endocrine ROS  Renal/GU      Musculoskeletal  (+) Arthritis ,   Abdominal   Peds  Hematology negative hematology ROS (+)   Anesthesia Other Findings   Reproductive/Obstetrics                             Anesthesia Physical Anesthesia Plan  ASA: 2  Anesthesia Plan: General   Post-op Pain Management:    Induction:   PONV Risk Score and Plan: 4 or greater and Ondansetron, Dexamethasone, Midazolam and Treatment may vary due to age or medical condition  Airway Management Planned: Oral ETT  Additional Equipment:   Intra-op Plan:   Post-operative Plan: Extubation in OR  Informed Consent: I have reviewed the patients History and Physical, chart, labs and discussed the procedure including the risks, benefits and alternatives for the proposed anesthesia with the patient or authorized representative who has indicated his/her understanding and acceptance.     Dental advisory given  Plan Discussed with: Anesthesiologist and CRNA  Anesthesia Plan Comments:         Anesthesia Quick Evaluation

## 2021-10-20 NOTE — Brief Op Note (Signed)
10/20/2021  1:28 PM  PATIENT:  Sarah Rush  61 y.o. female  PRE-OPERATIVE DIAGNOSIS:  URETERAL STONE, ILEAL CONDUIT  POST-OPERATIVE DIAGNOSIS:  URETERAL STONE, ILEAL CONDUIT  PROCEDURE:  Procedure(s): ANTEGRADE URETEROSCOPY, STENT CHANGE, NEPHROSTOGRAM (Left)  SURGEON:  Surgeon(s) and Role:    * Alexis Frock, MD - Primary  PHYSICIAN ASSISTANT:   ASSISTANTS: none   ANESTHESIA:   general  EBL:  minimal   BLOOD ADMINISTERED:none  DRAINS:  nephroureteral stent LEFT (capped)    LOCAL MEDICATIONS USED:  NONE  SPECIMEN:  No Specimen  DISPOSITION OF SPECIMEN:  N/A  COUNTS:  YES  TOURNIQUET:  * No tourniquets in log *  DICTATION: .Other Dictation: Dictation Number [id number]  PLAN OF CARE: Admit for overnight observation  PATIENT DISPOSITION:  PACU - hemodynamically stable.   Delay start of Pharmacological VTE agent (>24hrs) due to surgical blood loss or risk of bleeding: not applicable

## 2021-10-20 NOTE — Anesthesia Procedure Notes (Signed)
Procedure Name: Intubation Date/Time: 10/20/2021 12:38 PM  Performed by: Cynda Familia, CRNAPre-anesthesia Checklist: Patient identified, Emergency Drugs available, Suction available and Patient being monitored Patient Re-evaluated:Patient Re-evaluated prior to induction Oxygen Delivery Method: Circle System Utilized Preoxygenation: Pre-oxygenation with 100% oxygen Induction Type: IV induction Ventilation: Mask ventilation without difficulty Laryngoscope Size: Miller and 2 Grade View: Grade I Tube type: Oral Number of attempts: 1 Airway Equipment and Method: Stylet Placement Confirmation: ETT inserted through vocal cords under direct vision, positive ETCO2 and breath sounds checked- equal and bilateral Secured at: 21 cm Tube secured with: Tape Dental Injury: Teeth and Oropharynx as per pre-operative assessment  Comments: Smooth IV induction Moser- intubation AM CRNA atraumatic-- teeth and mouth as preop- no teeth

## 2021-10-20 NOTE — Op Note (Signed)
NAME: Sarah Rush, Sarah Rush MEDICAL RECORD NO: [id number] ACCOUNT NO: [id number] DATE OF BIRTH: 11/11/1960 FACILITY: [facility] PHYSICIAN: Alexis Frock, MD  Operative Report   DATE OF PROCEDURE: 10/20/2021  PREOPERATIVE DIAGNOSES:  Left ureteral stone, history of pyelonephritis, history of bladder cancer, status post urinary diversion.  PROCEDURE:  1.  Antegrade nephrostogram interpretation. 2.  Left diagnostic ureteroscopy. 3.  Exchange of left nephroureteral stent.  ESTIMATED BLOOD LOSS:  Nil.  COMPLICATIONS:  None.  SPECIMEN:  None.  FINDINGS:  1.  Unremarkable antegrade nephrostogram without filling defects. 2.  No evidence of intraluminal urolithiasis seen whatsoever within the left kidney and ureter all the way to the conduit. 3.  Exchange of left nephrostomy tube for antegrade nephroureteral stent, capped.  INDICATIONS:  The patient is a very pleasant 61 year old lady with history of aggressive bladder cancer.  She is status post pelvic exenteration with cystectomy and urinary diversion previously. She has done exceptionally well oncologically and  functionally following this. She was found on workup of colicky flank pain and fevers to have a left ureteral stone, complicated by pyelonephritis with hydronephrosis.  She appropriately underwent nephrostomy tube drainage at that point, intravenous  antibiotics cleared her infectious parameters and was referred for definitive management of her stone.  Given her variant anatomy with urinary diversion, I felt the safest means of management would be antegrade ureteroscopy via nephrostomy tract with a  goal of stone free as this was her only known calcification on imaging.  She presents for this today.  Informed consent was obtained and placed in medical record.  PROCEDURE IN DETAIL:  The patient was verified, procedure being left ureteroscopic stone manipulation antegrade was confirmed.  Procedure timeout was performed.   Intravenous antibiotics were administered.  General endotracheal anesthesia induced.  The  patient was placed into a prone position, employing prone view, chest rolls, axillary rolls, padding of her knees and ankles.  She was further fastened to the operating table using tape over foam padding across her pelvis and chest.  In situ left  nephrostomy tube was prepped into a sterile field using chlorhexidine gluconate after it was capped.  Percutaneous type drape was applied.  Notably, her urostomy was connected to  straight drain. Initial antegrade nephrostogram was performed.  Antegrade nephrostogram revealed excellent placement of the left nephrostomy tube at the level of the kidney.  There was no hydronephrosis noted, whatsoever.  There was free flow of contrast into the left ureter all the way to the conduit and there were  no obvious filling defects noted, whatsoever.  A ZIPwire was advanced through the nephrostomy tube, coiled in the kidney.  Nephrostomy tube was removed and a KMP type catheter was advanced with angle-tip and this was used to navigate all the way down to  the level of the conduit and the ZIPwire was exchanged for a Super Stiff wire and a peel-away dual lumen sheath was advanced in antegrade fashion to the level of proximal ureter.  A ZIPwire was once again advanced through this. Peel-away sheath was  removed.  ZIPwire was set aside as a safety wire and a short length ureteral access sheath was placed over the Super Stiff wire to the level of proximal ureter using continuous fluoroscopic guidance.  Next, antegrade ureteroscopy was performed using  single channel digital ureteroscope and the entire ureter was inspected all the way to the level of the conduit.  There was no evidence of intraluminal stone, neoplasia, stricture or significant mucosal abnormalities whatsoever.  The entire length of the  ureter was inspected x3.  It was felt to likely represent interval passage of the ureteral  stone likely to the level of the conduit and out. The access sheath was carefully retrograde positioned to the level of the kidney and the entire kidney was  inspected including all accessible calices and again no evidence of intraluminal stone was seen whatsoever.  There was what appeared to be a calcification in possibly upper pole area on fluoroscopic imaging.  However, this is not intrarenal, with direct  visualization and final antegrade nephrostogram also corroborated this being a likely extrarenal calcification.  We achieved the goals for today, which is to verify her stone free. Given the antegrade manipulation, it was felt that brief interval  stenting with nephroureteral stent in a capped fashion would be safest and most prudent.  As such, the KMP type catheter was advanced over the safety wire to the proximal conduit externalized and capped.  This was sutured in place with silk x2.  A dressing  of 4 x 4s and Tegaderm was applied to the level of the flank and the procedure was terminated.  The patient tolerated the procedure well, no immediate perioperative complications.  The patient was taken to postanesthesia care unit in stable condition.   Plan for observation admission, likely discharge home tomorrow, following fever free.  She will have her nephroureteral stent removed in the office and outpatient followup.   SHW D: 10/20/2021 1:36:02 pm T: 10/20/2021 9:15:00 pm  JOB: [id number]/ [id number]

## 2021-10-20 NOTE — H&P (Signed)
Sarah Rush is an 61 y.o. female.    Chief Complaint: Pre-Op Left Percutaneous Nephrostolithotomy / Antegrade Ureteroscopy  HPI:   1 - Left Ureteral Stone - neph tube placed 09/2021 for 54m ureteral stone complicated by pyelo. She is s/p cystectomy and conduit diversion for bladder cancer. Most recent UCX pan-sensitive e. Coli.  Today "Sarah Rush is seen to proceed with left PCNL / antegrade ureteroscopy for management of stone with history of urinary diversion. No interval fevers. She is on keflex proph pre-op as directed according to most recent culture.   Past Medical History:  Diagnosis Date   Anxiety    Cancer ([facility]    bladder   GERD (gastroesophageal reflux disease)    History of kidney stones    Lower abdominal pain    Rheumatoid arthritis (HCC)    Urinary tract infection symptoms     Past Surgical History:  Procedure Laterality Date   ABDOMINAL HYSTERECTOMY     CESAREAN SECTION  1982   CHOLECYSTECTOMY     CYSTOSCOPY W/ RETROGRADES Bilateral 07/02/2019   Procedure: CYSTOSCOPY WITH RETROGRADE PYELOGRAM;  Surgeon: BHollice Espy MD;  Location: [facility];  Service: Urology;  Laterality: Bilateral;   CYSTOSCOPY W/ RETROGRADES Bilateral 12/21/2019   Procedure: CYSTOSCOPY WITH RETROGRADE PYELOGRAM;  Surgeon: BHollice Espy MD;  Location: [facility];  Service: Urology;  Laterality: Bilateral;   CYSTOSCOPY WITH BIOPSY N/A 12/22/2018   Procedure: CYSTOSCOPY WITH BIOPSY;  Surgeon: BHollice Espy MD;  Location: [facility];  Service: Urology;  Laterality: N/A;   CYSTOSCOPY WITH INJECTION N/A 04/08/2020   Procedure: CYSTOSCOPY WITH INJECTION OF INDOCYANINE GREEN DYE;  Surgeon: MAlexis Frock MD;  Location: [facility];  Service: Urology;  Laterality: N/A;   IR NEPHROSTOMY PLACEMENT LEFT  10/06/2021   LYMPHADENECTOMY Bilateral 04/08/2020   Procedure: LYMPHADENECTOMY;  Surgeon: MAlexis Frock MD;  Location: [facility];  Service: Urology;  Laterality: Bilateral;   ROBOT ASSISTED  LAPAROSCOPIC COMPLETE CYSTECT ILEAL CONDUIT N/A 04/08/2020   Procedure: XI ROBOTIC ASSISTED LAPAROSCOPIC COMPLETE CYSTECT , TOTAL ABDOMINAL HYSTERECTOMY, BILATERAL SALPINGO-OOPHORECTOMY AND ILEAL CONDUIT;  Surgeon: MAlexis Frock MD;  Location: [facility];  Service: Urology;  Laterality: N/A;  6 HRS   TRANSURETHRAL RESECTION OF BLADDER TUMOR WITH MITOMYCIN-C N/A 12/22/2018   Procedure: TRANSURETHRAL RESECTION OF BLADDER TUMOR WITH Gemcitabine;  Surgeon: BHollice Espy MD;  Location: [facility];  Service: Urology;  Laterality: N/A;   TRANSURETHRAL RESECTION OF BLADDER TUMOR WITH MITOMYCIN-C N/A 07/02/2019   Procedure: TRANSURETHRAL RESECTION OF BLADDER TUMOR WITH Gemcitabine;  Surgeon: BHollice Espy MD;  Location: [facility];  Service: Urology;  Laterality: N/A;   TRANSURETHRAL RESECTION OF BLADDER TUMOR WITH MITOMYCIN-C N/A 12/21/2019   Procedure: TRANSURETHRAL RESECTION OF BLADDER TUMOR WITH Gemcitabine;  Surgeon: BHollice Espy MD;  Location: [facility];  Service: Urology;  Laterality: N/A;   TRANSURETHRAL RESECTION OF BLADDER TUMOR WITH MITOMYCIN-C N/A 02/01/2020   Procedure: TRANSURETHRAL RESECTION OF BLADDER TUMOR WITH Gemcitabine;  Surgeon: BHollice Espy MD;  Location: [facility];  Service: Urology;  Laterality: N/A;    Family History  Problem Relation Age of Onset   Hypertension Other    Social History:  reports that she quit smoking about 7 years ago. Her smoking use included cigarettes. She has never used smokeless tobacco. She reports current alcohol use. She reports current drug use. Frequency: 1.00 time per week. Drug: Marijuana.  Allergies: No Known Allergies  No medications prior to admission.    No results found for this or any previous visit (from the  past 48 hour(s)). No results found.  Review of Systems  Constitutional:  Negative for chills and fever.  All other systems reviewed and are negative.   There were no vitals taken for this visit. Physical Exam Vitals  reviewed.  HENT:     Head: Normocephalic.  Abdominal:     Comments: Stable truncal obesity and RLQ Urostomy with copious non-foul urine.   Genitourinary:    General: Normal vulva.     Comments: Left neph tube c/d/I with non-foul urine.  Neurological:     General: No focal deficit present.     Mental Status: She is alert.  Psychiatric:        Mood and Affect: Mood normal.      Assessment/Plan  Proceed as planned with LEFT PCNL / antegrade ureteroscopy. Risks, benefits,alternatives, expected peri-op course discussed previously and reiterated today.   Alexis Frock, MD 10/20/2021, 7:53 AM

## 2021-10-20 NOTE — Anesthesia Procedure Notes (Signed)
Date/Time: 10/20/2021 1:47 PM  Performed by: Cynda Familia, CRNAOxygen Delivery Method: Simple face mask Placement Confirmation: positive ETCO2 and breath sounds checked- equal and bilateral Dental Injury: Teeth and Oropharynx as per pre-operative assessment

## 2021-10-20 NOTE — Transfer of Care (Signed)
Immediate Anesthesia Transfer of Care Note  Patient: Sarah Rush  Procedure(s) Performed: ANTEGRADE URETEROSCOPY, STENT CHANGE, NEPHROSTOGRAM (Left)  Patient Location: PACU  Anesthesia Type:General  Level of Consciousness: awake and alert   Airway & Oxygen Therapy: Patient Spontanous Breathing and Patient connected to face mask oxygen  Post-op Assessment: Report given to RN and Post -op Vital signs reviewed and stable  Post vital signs: Reviewed and stable  Last Vitals:  Vitals Value Taken Time  BP 149/69 10/20/21 1354  Temp    Pulse 100 10/20/21 1355  Resp 20 10/20/21 1355  SpO2 100 % 10/20/21 1355  Vitals shown include unvalidated device data.  Last Pain:  Vitals:   10/20/21 1146  TempSrc: Oral  PainSc: 0-No pain         Complications: No notable events documented.

## 2021-10-20 NOTE — Discharge Instructions (Addendum)
1 - You may have bloody urine on / off with stent in place. This is normal. OK to shower with stent in place and replace dressing as needed.   2 - Call MD or go to ER for fever >102, severe pain / nausea / vomiting not relieved by medications, or acute change in medical status

## 2021-10-21 DIAGNOSIS — N201 Calculus of ureter: Secondary | ICD-10-CM | POA: Diagnosis not present

## 2021-10-21 NOTE — Progress Notes (Signed)
Pt discharged to home. Prior to DC, IV was removed. Pt was given DC paperwork regarding appointments and medication. Pt verbalized understanding and states no other concerns at this time. Pt stable at time of DC and left in personal vehicle driven by her husband.

## 2021-10-21 NOTE — Discharge Summary (Signed)
  Date of admission: 10/20/2021  Date of discharge: 10/21/2021  Admission diagnosis: Left ureteral calculus  Discharge diagnosis: Left ureteral calculus  Secondary diagnoses: History of bladder cancer status post cystectomy with ileal conduit urinary diversion  History and Physical: For full details, please see admission history and physical. Briefly, Sarah Rush is a 61 y.o. year old patient with history of bladder cancer with prior cystectomy and ileal conduit urinary diversion.  Noted to have a 4 mm ureteral stone complicated by pyelopatient admitted this time to undergo left PCNL with antegrade ureteroscopy for management of her stone.Marland Kitchen   Hospital Course: Patient was admitted after undergoing antegrade ureteroscopy.  For details procedure please see the typed operative note.  Postoperative course was unremarkable.  She had a nephroureteral stent which was capped and she had no fever overnight.  She is felt ready for discharge home.  Patient be scheduled for follow-up next week for removal of her nephroureteral stent.  She has our telephone number and knows to call should problems arise relative to management in the interim.  Laboratory values: No results for input(s): "HGB", "HCT" in the last 72 hours. No results for input(s): "CREATININE" in the last 72 hours.  Disposition: Home  Discharge instruction: The patient was instructed to be ambulatory but told to refrain from heavy lifting, strenuous activity, or driving.   Discharge medications:    Followup:   Follow-up Information     Alexis Frock, MD Follow up on 11/07/2021.   Specialty: Urology Why: at 12:30 for MD visit and nephroureteral stent removal. Contact information: [place] [id number] [phone]

## 2021-10-21 NOTE — Progress Notes (Signed)
1 Day Post-Op Subjective: Feeling well status post antegrade ureteroscopy.  Having some tenderness in the left flank but not severe.  Wants to go home , nephroureteral stent is capped off.  No fever overnight  Objective: Vital signs in last 24 hours: Temp:  [97.5 F (36.4 C)-98.9 F (37.2 C)] 97.9 F (36.6 C) (09/23 0612) Pulse Rate:  [65-101] 78 (09/23 0612) Resp:  [14-19] 18 (09/23 0612) BP: (120-157)/(62-94) 120/65 (09/23 0612) SpO2:  [93 %-100 %] 93 % (09/23 0612) Weight:  [69 kg] 69 kg (09/22 1146)  Intake/Output from previous day: 09/22 0701 - 09/23 0700 In: 1575.9 [P.O.:240; I.V.:1227.4; IV Piggyback:108.5] Out: 0  Intake/Output this shift: No intake/output data recorded.  Physical Exam:  General: Alert and oriented  Abdomen: Soft, ND   Lab Results: No results for input(s): "HGB", "HCT" in the last 72 hours. BMET No results for input(s): "NA", "K", "CL", "CO2", "GLUCOSE", "BUN", "CREATININE", "CALCIUM" in the last 72 hours.   Studies/Results: DG C-Arm 1-60 Min-No Report  Result Date: 10/20/2021 Fluoroscopy was utilized by the requesting physician.  No radiographic interpretation.    Assessment/Plan: 1.  Status post antegrade ureteroscopy, doing well.  Plan for follow-up with Dr. Tresa Moore next week for nephroureteral stent removal.    LOS: 0 days   Sarah Rush 10/21/2021, 7:53 AM

## 2021-10-21 NOTE — TOC CM/SW Note (Signed)
  Transition of Care [facility]) Screening Note   Patient Details  Name: MIREILLE LACOMBE Date of Birth: Apr 11, 1960   Transition of Care [facility]) CM/SW Contact:    Ross Ludwig, LCSW Phone Number: 10/21/2021, 1:45 PM    Transition of Care Department [facility]) has reviewed patient and no TOC needs have been identified at this time. We will continue to monitor patient advancement through interdisciplinary progression rounds. If new patient transition needs arise, please place a TOC consult.

## 2021-10-23 ENCOUNTER — Other Ambulatory Visit: Payer: Self-pay

## 2021-10-23 ENCOUNTER — Emergency Department (HOSPITAL_COMMUNITY)
Admission: EM | Admit: 2021-10-23 | Discharge: 2021-10-23 | Disposition: A | Discharge status: Home / Self Care | Payer: BC Managed Care – PPO | Attending: Emergency Medicine | Admitting: Emergency Medicine

## 2021-10-23 ENCOUNTER — Emergency Department (HOSPITAL_COMMUNITY): Payer: BC Managed Care – PPO

## 2021-10-23 ENCOUNTER — Encounter (HOSPITAL_COMMUNITY): Payer: Self-pay

## 2021-10-23 DIAGNOSIS — L03319 Cellulitis of trunk, unspecified: Secondary | ICD-10-CM | POA: Insufficient documentation

## 2021-10-23 DIAGNOSIS — T83098A Other mechanical complication of other indwelling urethral catheter, initial encounter: Secondary | ICD-10-CM

## 2021-10-23 DIAGNOSIS — T83092A Other mechanical complication of nephrostomy catheter, initial encounter: Secondary | ICD-10-CM | POA: Diagnosis present

## 2021-10-23 LAB — COMPREHENSIVE METABOLIC PANEL
ALT: 15 U/L (ref 0–44)
AST: 14 U/L — ABNORMAL LOW (ref 15–41)
Albumin: 4 g/dL (ref 3.5–5.0)
Alkaline Phosphatase: 85 U/L (ref 38–126)
Anion gap: 8 (ref 5–15)
BUN: 19 mg/dL (ref 8–23)
CO2: 25 mmol/L (ref 22–32)
Calcium: 9.7 mg/dL (ref 8.9–10.3)
Chloride: 105 mmol/L (ref 98–111)
Creatinine, Ser: 1.17 mg/dL — ABNORMAL HIGH (ref 0.44–1.00)
GFR, Estimated: 53 mL/min — ABNORMAL LOW (ref >60)
Glucose, Bld: 111 mg/dL — ABNORMAL HIGH (ref 70–99)
Potassium: 4.2 mmol/L (ref 3.5–5.1)
Sodium: 138 mmol/L (ref 135–145)
Total Bilirubin: 0.6 mg/dL (ref 0.3–1.2)
Total Protein: 7.8 g/dL (ref 6.5–8.1)

## 2021-10-23 LAB — URINALYSIS, ROUTINE W REFLEX MICROSCOPIC
Bacteria, UA: NONE SEEN
Bilirubin Urine: NEGATIVE
Glucose, UA: NEGATIVE mg/dL
Ketones, ur: NEGATIVE mg/dL
Nitrite: NEGATIVE
Protein, ur: 100 mg/dL — AB
RBC / HPF: >50 RBC/hpf — ABNORMAL HIGH (ref 0–5)
Specific Gravity, Urine: 1.011 (ref 1.005–1.030)
pH: 7 (ref 5.0–8.0)

## 2021-10-23 LAB — CBC WITH DIFFERENTIAL/PLATELET
Abs Immature Granulocytes: 0.03 10*3/uL (ref 0.00–0.07)
Basophils Absolute: 0.1 10*3/uL (ref 0.0–0.1)
Basophils Relative: 1 %
Eosinophils Absolute: 0.1 10*3/uL (ref 0.0–0.5)
Eosinophils Relative: 1 %
HCT: 37.2 % (ref 36.0–46.0)
Hemoglobin: 12.1 g/dL (ref 12.0–15.0)
Immature Granulocytes: 0 %
Lymphocytes Relative: 17 %
Lymphs Abs: 1.4 10*3/uL (ref 0.7–4.0)
MCH: 29.4 pg (ref 26.0–34.0)
MCHC: 32.5 g/dL (ref 30.0–36.0)
MCV: 90.3 fL (ref 80.0–100.0)
Monocytes Absolute: 0.8 10*3/uL (ref 0.1–1.0)
Monocytes Relative: 10 %
Neutro Abs: 6 10*3/uL (ref 1.7–7.7)
Neutrophils Relative %: 71 %
Platelets: 243 10*3/uL (ref 150–400)
RBC: 4.12 MIL/uL (ref 3.87–5.11)
RDW: 13.2 % (ref 11.5–15.5)
WBC: 8.4 10*3/uL (ref 4.0–10.5)
nRBC: 0 % (ref 0.0–0.2)

## 2021-10-23 MED ORDER — CEPHALEXIN 500 MG PO CAPS
500.0000 mg | ORAL_CAPSULE | Freq: Once | ORAL | Status: AC
  Administered 2021-10-23: 500 mg via ORAL
  Filled 2021-10-23: qty 1

## 2021-10-23 MED ORDER — CEPHALEXIN 500 MG PO CAPS: 500.0000 mg | ORAL_CAPSULE | Freq: Four times a day (QID) | ORAL | 0 refills | Status: DC

## 2021-10-23 NOTE — Discharge Instructions (Addendum)
Take the antibiotics as prescribed.  Follow-up as recommended by the urologist.  Return to the ED for fevers vomiting or other concerning symptoms

## 2021-10-23 NOTE — Consult Note (Signed)
Urology Consult   Physician requesting consult: Dr. Dorie Rank, MD  Reason for consult: Leakage around nephrostomy site  History of Present Illness: Sarah Rush is a 61 y.o. female with a history of high-grade bladder cancer status post cystectomy with TAH, BSO and ileal conduit in March 2022.  She recently presented with a 5 mm left obstructing ureteral stone status post nephrostomy tube placement on 10/06/2021.  She is now status post antegrade left ureteroscopy on 10/20/2021.  During the procedure, the stone was not found and a nephroureteral stent was placed with plan for her to follow-up for subsequent removal.  The patient did fine postoperatively and was discharged on postop day 1.  She notes that over the weekend, she had increased drainage from around the tube as well as decreased drainage to her urostomy bag.  Her urine in the bag also became bloody.  Due to this, she presented to the emergency department today. She is afebrile and hemodynamically stable.  Labs without leukocytosis and creatinine is stable.  Subsequent CT imaging with nephroureteral stent in appropriate position with mild hydronephrosis, mild perinephric stranding.  There is no perinephric fluid collection.  Patient denies any flank pain at this time.  She just notes that she is sore from after the procedure.  She denies any fevers at home.  Past Medical History:  Diagnosis Date   Anxiety    Cancer ([place])    bladder   GERD (gastroesophageal reflux disease)    History of kidney stones    Lower abdominal pain    Rheumatoid arthritis ([facility])    Urinary tract infection symptoms     Past Surgical History:  Procedure Laterality Date   ABDOMINAL HYSTERECTOMY     CESAREAN SECTION  1982   CHOLECYSTECTOMY     CYSTOSCOPY W/ RETROGRADES Bilateral 07/02/2019   Procedure: CYSTOSCOPY WITH RETROGRADE PYELOGRAM;  Surgeon: Hollice Espy, MD;  Location: [facility];  Service: Urology;  Laterality: Bilateral;   CYSTOSCOPY W/  RETROGRADES Bilateral 12/21/2019   Procedure: CYSTOSCOPY WITH RETROGRADE PYELOGRAM;  Surgeon: Hollice Espy, MD;  Location: [facility];  Service: Urology;  Laterality: Bilateral;   CYSTOSCOPY WITH BIOPSY N/A 12/22/2018   Procedure: CYSTOSCOPY WITH BIOPSY;  Surgeon: Hollice Espy, MD;  Location: [facility];  Service: Urology;  Laterality: N/A;   CYSTOSCOPY WITH INJECTION N/A 04/08/2020   Procedure: CYSTOSCOPY WITH INJECTION OF INDOCYANINE GREEN DYE;  Surgeon: Alexis Frock, MD;  Location: [facility];  Service: Urology;  Laterality: N/A;   IR NEPHROSTOMY PLACEMENT LEFT  10/06/2021   LYMPHADENECTOMY Bilateral 04/08/2020   Procedure: LYMPHADENECTOMY;  Surgeon: Alexis Frock, MD;  Location: [facility];  Service: Urology;  Laterality: Bilateral;   ROBOT ASSISTED LAPAROSCOPIC COMPLETE CYSTECT ILEAL CONDUIT N/A 04/08/2020   Procedure: XI ROBOTIC ASSISTED LAPAROSCOPIC COMPLETE CYSTECT , TOTAL ABDOMINAL HYSTERECTOMY, BILATERAL SALPINGO-OOPHORECTOMY AND ILEAL CONDUIT;  Surgeon: Alexis Frock, MD;  Location: [facility];  Service: Urology;  Laterality: N/A;  6 HRS   TRANSURETHRAL RESECTION OF BLADDER TUMOR WITH MITOMYCIN-C N/A 12/22/2018   Procedure: TRANSURETHRAL RESECTION OF BLADDER TUMOR WITH Gemcitabine;  Surgeon: Hollice Espy, MD;  Location: [facility];  Service: Urology;  Laterality: N/A;   TRANSURETHRAL RESECTION OF BLADDER TUMOR WITH MITOMYCIN-C N/A 07/02/2019   Procedure: TRANSURETHRAL RESECTION OF BLADDER TUMOR WITH Gemcitabine;  Surgeon: Hollice Espy, MD;  Location: [facility];  Service: Urology;  Laterality: N/A;   TRANSURETHRAL RESECTION OF BLADDER TUMOR WITH MITOMYCIN-C N/A 12/21/2019   Procedure: TRANSURETHRAL RESECTION OF BLADDER TUMOR WITH Gemcitabine;  Surgeon: Erlene Quan,  Caryl Pina, MD;  Location: [facility];  Service: Urology;  Laterality: N/A;   TRANSURETHRAL RESECTION OF BLADDER TUMOR WITH MITOMYCIN-C N/A 02/01/2020   Procedure: TRANSURETHRAL RESECTION OF BLADDER TUMOR WITH Gemcitabine;  Surgeon:  Hollice Espy, MD;  Location: [facility];  Service: Urology;  Laterality: N/A;    Current Hospital Medications:  Home Meds:  Current Meds  Medication Sig   cephALEXin (KEFLEX) 500 MG capsule Take 1 capsule (500 mg total) by mouth 4 (four) times daily.    Scheduled Meds: Continuous Infusions: PRN Meds:.  Allergies: No Known Allergies  Family History  Problem Relation Age of Onset   Hypertension Other     Social History:  reports that she quit smoking about 7 years ago. Her smoking use included cigarettes. She has never used smokeless tobacco. She reports current alcohol use. She reports current drug use. Frequency: 1.00 time per week. Drug: Marijuana.  ROS: A complete review of systems was performed.  All systems are negative except for pertinent findings as noted.  Physical Exam:  Vital signs in last 24 hours: Temp:  [98.2 F (36.8 C)-98.3 F (36.8 C)] 98.2 F (36.8 C) (09/25 2000) Pulse Rate:  [76-92] 85 (09/25 2130) Resp:  [9-22] 16 (09/25 2130) BP: (84-157)/(63-88) 139/63 (09/25 2130) SpO2:  [96 %-100 %] 100 % (09/25 2130) Weight:  [68.9 kg] 68.9 kg (09/25 1423) Constitutional:  Alert and oriented, No acute distress Cardiovascular: Regular rate  Respiratory: Normal respiratory effort on room air GI: Abdomen is soft, nontender, nondistended.  Right lower quadrant urostomy in place, stoma healthy pink and viable.  Urine in urostomy bag is watermelon in color and thin. Back: Left nephroureteral stent in place with surrounding blanching erythema Neurologic: Grossly intact, no focal deficits Psychiatric: Normal mood and affect  Laboratory Data:  Recent Labs    10/23/21 1438  WBC 8.4  HGB 12.1  HCT 37.2  PLT 243    Recent Labs    10/23/21 1438  NA 138  K 4.2  CL 105  GLUCOSE 111*  BUN 19  CALCIUM 9.7  CREATININE 1.17*     Results for orders placed or performed during the hospital encounter of 10/23/21 (from the past 24 hour(s))  Comprehensive  metabolic panel     Status: Abnormal   Collection Time: 10/23/21  2:38 PM  Result Value Ref Range   Sodium 138 135 - 145 mmol/L   Potassium 4.2 3.5 - 5.1 mmol/L   Chloride 105 98 - 111 mmol/L   CO2 25 22 - 32 mmol/L   Glucose, Bld 111 (H) 70 - 99 mg/dL   BUN 19 8 - 23 mg/dL   Creatinine, Ser 1.17 (H) 0.44 - 1.00 mg/dL   Calcium 9.7 8.9 - 10.3 mg/dL   Total Protein 7.8 6.5 - 8.1 g/dL   Albumin 4.0 3.5 - 5.0 g/dL   AST 14 (L) 15 - 41 U/L   ALT 15 0 - 44 U/L   Alkaline Phosphatase 85 38 - 126 U/L   Total Bilirubin 0.6 0.3 - 1.2 mg/dL   GFR, Estimated 53 (L) >60 mL/min   Anion gap 8 5 - 15  CBC with Differential     Status: None   Collection Time: 10/23/21  2:38 PM  Result Value Ref Range   WBC 8.4 4.0 - 10.5 K/uL   RBC 4.12 3.87 - 5.11 MIL/uL   Hemoglobin 12.1 12.0 - 15.0 g/dL   HCT 37.2 36.0 - 46.0 %   MCV 90.3 80.0 - 100.0  fL   MCH 29.4 26.0 - 34.0 pg   MCHC 32.5 30.0 - 36.0 g/dL   RDW 13.2 11.5 - 15.5 %   Platelets 243 150 - 400 K/uL   nRBC 0.0 0.0 - 0.2 %   Neutrophils Relative % 71 %   Neutro Abs 6.0 1.7 - 7.7 K/uL   Lymphocytes Relative 17 %   Lymphs Abs 1.4 0.7 - 4.0 K/uL   Monocytes Relative 10 %   Monocytes Absolute 0.8 0.1 - 1.0 K/uL   Eosinophils Relative 1 %   Eosinophils Absolute 0.1 0.0 - 0.5 K/uL   Basophils Relative 1 %   Basophils Absolute 0.1 0.0 - 0.1 K/uL   Immature Granulocytes 0 %   Abs Immature Granulocytes 0.03 0.00 - 0.07 K/uL  Urinalysis, Routine w reflex microscopic Urine, Suprapubic     Status: Abnormal   Collection Time: 10/23/21  6:12 PM  Result Value Ref Range   Color, Urine RED (A) YELLOW   APPearance CLOUDY (A) CLEAR   Specific Gravity, Urine 1.011 1.005 - 1.030   pH 7.0 5.0 - 8.0   Glucose, UA NEGATIVE NEGATIVE mg/dL   Hgb urine dipstick LARGE (A) NEGATIVE   Bilirubin Urine NEGATIVE NEGATIVE   Ketones, ur NEGATIVE NEGATIVE mg/dL   Protein, ur 100 (A) NEGATIVE mg/dL   Nitrite NEGATIVE NEGATIVE   Leukocytes,Ua MODERATE (A) NEGATIVE    RBC / HPF >50 (H) 0 - 5 RBC/hpf   WBC, UA 11-20 0 - 5 WBC/hpf   Bacteria, UA NONE SEEN NONE SEEN   No results found for this or any previous visit (from the past 240 hour(s)).  Renal Function: Recent Labs    10/23/21 1438  CREATININE 1.17*   Estimated Creatinine Clearance: 47 mL/min (A) (by C-G formula based on SCr of 1.17 mg/dL (H)).  Radiologic Imaging: CT Renal Stone Study  Result Date: 10/23/2021 CLINICAL DATA:  Nephrostomy tube complication EXAM: CT ABDOMEN AND PELVIS WITHOUT CONTRAST TECHNIQUE: Multidetector CT imaging of the abdomen and pelvis was performed following the standard protocol without IV contrast. RADIATION DOSE REDUCTION: This exam was performed according to the departmental dose-optimization program which includes automated exposure control, adjustment of the mA and/or kV according to patient size and/or use of iterative reconstruction technique. COMPARISON:  Fluoroscopic images 922 2023, 10/06/2021, CT 01/10/2020 FINDINGS: Lower chest: Lung bases demonstrate no acute airspace disease. Cardiac size within normal limits. Hepatobiliary: Multiple liver cysts and subcentimeter hypodensities without significant change. Status post cholecystectomy. No biliary dilatation Pancreas: Unremarkable. No pancreatic ductal dilatation or surrounding inflammatory changes. Spleen: Normal in size without focal abnormality. Adrenals/Urinary Tract: Adrenal glands are normal. Patient is status post cystectomy with right abdominal ileal conduit. Atrophy and cortical scarring right kidney. Left-sided percutaneous nephrostomy catheter, the distal tip is positioned within the ileum near the surgical sutures within the anterior mid upper pelvis. There is mild left hydronephrosis with soft tissue stranding at the left renal pelvis and left ureter. There is no right hydronephrosis or hydroureter. No definite calcified stones along the left ureteral stent. 4 mm stone lower pole left kidney. Stomach/Bowel:  The stomach is nonenlarged. No dilated small bowel. No acute bowel wall thickening. Postsurgical changes of left lower quadrant bowel without obstruction or wall thickening. Vascular/Lymphatic: Moderate aortic atherosclerosis. No aneurysm. No suspicious lymph nodes. Reproductive: Status post hysterectomy. No adnexal masses. Other: Negative for pelvic effusion or free air. Edema within the left posterior flank soft tissues at the level of the nephrostomy catheter. Musculoskeletal: No acute osseous  abnormality. IMPRESSION: 1. Patient is status post cystectomy with right abdominal ileal conduit. There is a left-sided percutaneous stent with the tip terminating in the ileal segment/diversion adjacent to the mid line anterior pelvic sutures. There is mild to moderate left hydronephrosis but no definitive stone along the course of the stent. There is moderate left perinephric stranding and soft tissue stranding about the renal collecting system suggestive of superimposed infection. 2. Cortical scarring of the right kidney. Right kidney shows no hydronephrosis or obstructing stone. Electronically Signed   By: Donavan Foil M.D.   On: 10/23/2021 15:44    I independently reviewed the above imaging studies.  Impression/Recommendation 61 year old female with history of bladder cancer status post cystectomy with ileal conduit, TAH, BSO who recently was found to have obstructing ureteral stone status post nephrostomy tube.  Most recently status post antegrade left ureteroscopy with nephroureteral stent placement on 10/20/2021.  Presents to the emergency department today for evaluation of increased leakage around her nephroureteral stent.  Patient is afebrile and hemodynamically stable.  Labs without leukocytosis, renal function stable.  Urinalysis without overt concern of infection.  CT scan with nephroureteral stent in appropriate position, mild hydro and perinephric stranding which can be seen after most recent  procedure.  There is no evidence of perinephric fluid collection.  Exam notable for erythema around nephrostomy tube.  Discussed findings with patient.  Discussed that there is no indication for acute urologic intervention and no indication for exchange of nephroureteral stent this time.  We will reach out to Dr. Barbee Cough tomorrow to discuss timing of nephroureteral stent removal.  Also will recommend 7-day course of empiric antibiotics for possible cellulitis due to erythema around nephrostomy tube site.    This was discussed with the ED physician.   Coralyn Pear, MD Resident physician [facility] 10/23/2021, 9:59 PM

## 2021-10-23 NOTE — ED Triage Notes (Signed)
Patient reports that  she has a urostomy. Patient had a left ureteral stent placed and patient states that it has been leaking around the insertion site. Patient also reports that her urine is not the normal amount and is bloody.

## 2021-10-23 NOTE — ED Provider Notes (Signed)
[facility] DEPT Provider Note   CSN: [id number] Arrival date & time: 10/23/21  1217     History  Chief Complaint  Patient presents with   Post-op Problem    Sarah Rush is a 61 y.o. female.  HPI   Patient has a history of reflux, rheumatoid arthritis, kidney stones urinary tract infection.  Patient has history of cholecystectomy transurethral resection of a bladder tumor and robot-assisted laparoscopic complete cystic ileal conduit.  Patient was recently seen for left ureteral calculus.  Patient ended up having pyelonephritis and was admitted to the hospital earlier this month.  She had a follow-up procedure on Friday.  Notes indicate that the patient was admitted for antegrade ureteroscopy.  She had a nephroureteral stent which was capped.  Patient was discharged and the plan was to have her follow-up with the urologist in a week to have the stent removed.  Patient states last night she noticed that she had a lot of leaking from around the nephrostomy tube site.  It was enough that it soaked her bed.  Patient now has developed some flank pain as well.  She has noticed decreased urine output in the catheter bag.  She has had some low-grade temps but no definite fevers.  Patient states she tried to call her urologist today but was unable to get through so she came to the ED  Home Medications Prior to Admission medications   Medication Sig Start Date End Date Taking? Authorizing Provider  cephALEXin (KEFLEX) 500 MG capsule Take 1 capsule (500 mg total) by mouth 4 (four) times daily. 10/23/21  Yes Dorie Rank, MD  acetaminophen (TYLENOL) 500 MG tablet Take 500-1,000 mg by mouth every 6 (six) hours as needed for mild pain or headache.    [provider]  Cholecalciferol (VITAMIN D3) 25 MCG (1000 UT) CAPS Take 3,000 Units by mouth daily.    [provider]  HUMIRA PEN 40 MG/0.4ML PNKT Inject 40 mg into the skin every 14 (fourteen) days.  09/26/21   [provider]  hydroxychloroquine (PLAQUENIL) 200 MG tablet Take 200 mg by mouth in the morning. 05/08/19   [provider]  loratadine (CLARITIN) 10 MG tablet Take 10 mg by mouth daily.    [provider]  Multiple Vitamins-Minerals (ONE-A-DAY WOMENS PO) Take 1 tablet by mouth daily with breakfast.    [provider]  ondansetron (ZOFRAN) 4 MG tablet Take 1 tablet (4 mg total) by mouth every 6 (six) hours as needed for nausea. 10/08/21   Geradine Girt, DO  oxyCODONE-acetaminophen (PERCOCET) 5-325 MG tablet Take 1 tablet by mouth every 6 (six) hours as needed for severe pain or moderate pain (post-operatively). 10/20/21 10/20/22  Alexis Frock, MD  PRILOSEC OTC 20 MG tablet Take 20 mg by mouth daily as needed (for reflux symptoms).    [provider]  sertraline (ZOLOFT) 100 MG tablet Take 100 mg by mouth daily. 09/25/21   [provider]      Allergies    Patient has no known allergies.    Review of Systems   Review of Systems  Physical Exam Updated Vital Signs BP 139/63   Pulse 85   Temp 98.2 F (36.8 C)   Resp 16   Ht 1.613 m (5' 3.5")   Wt 68.9 kg   SpO2 100%   BMI 26.50 kg/m  Physical Exam Vitals and nursing note reviewed.  Constitutional:      General: She is not in  acute distress.    Appearance: She is well-developed.  HENT:     Head: Normocephalic and atraumatic.     Right Ear: External ear normal.     Left Ear: External ear normal.  Eyes:     General: No scleral icterus.       Right eye: No discharge.        Left eye: No discharge.     Conjunctiva/sclera: Conjunctivae normal.  Neck:     Trachea: No tracheal deviation.  Cardiovascular:     Rate and Rhythm: Normal rate and regular rhythm.  Pulmonary:     Effort: Pulmonary effort is normal. No respiratory distress.     Breath sounds: Normal breath sounds. No stridor. No wheezing or rales.  Abdominal:     General: Bowel sounds are normal. There is  no distension.     Palpations: Abdomen is soft.     Tenderness: There is no abdominal tenderness. There is no guarding or rebound.     Comments: Erythema and induration noted around the left flank at the site of the nephrostomy tube, urine draining from around the left nephrostomy tube site, ileal conduit ostomy bag with bloody appearing urine some clots noted  Musculoskeletal:        General: No tenderness or deformity.     Cervical back: Neck supple.  Skin:    General: Skin is warm and dry.     Findings: No rash.  Neurological:     General: No focal deficit present.     Mental Status: She is alert.     Cranial Nerves: No cranial nerve deficit (no facial droop, extraocular movements intact, no slurred speech).     Sensory: No sensory deficit.     Motor: No abnormal muscle tone or seizure activity.     Coordination: Coordination normal.  Psychiatric:        Mood and Affect: Mood normal.     ED Results / Procedures / Treatments   Labs (all labs ordered are listed, but only abnormal results are displayed) Labs Reviewed  URINALYSIS, ROUTINE W REFLEX MICROSCOPIC - Abnormal; Notable for the following components:      Result Value   Color, Urine RED (*)    APPearance CLOUDY (*)    Hgb urine dipstick LARGE (*)    Protein, ur 100 (*)    Leukocytes,Ua MODERATE (*)    RBC / HPF >50 (*)    All other components within normal limits  COMPREHENSIVE METABOLIC PANEL - Abnormal; Notable for the following components:   Glucose, Bld 111 (*)    Creatinine, Ser 1.17 (*)    AST 14 (*)    GFR, Estimated 53 (*)    All other components within normal limits  CBC WITH DIFFERENTIAL/PLATELET    EKG None  Radiology CT Renal Stone Study  Result Date: 10/23/2021 CLINICAL DATA:  Nephrostomy tube complication EXAM: CT ABDOMEN AND PELVIS WITHOUT CONTRAST TECHNIQUE: Multidetector CT imaging of the abdomen and pelvis was performed following the standard protocol without IV contrast. RADIATION DOSE  REDUCTION: This exam was performed according to the departmental dose-optimization program which includes automated exposure control, adjustment of the mA and/or kV according to patient size and/or use of iterative reconstruction technique. COMPARISON:  Fluoroscopic images 922 2023, 10/06/2021, CT 01/10/2020 FINDINGS: Lower chest: Lung bases demonstrate no acute airspace disease. Cardiac size within normal limits. Hepatobiliary: Multiple liver cysts and subcentimeter hypodensities without significant change. Status post cholecystectomy. No biliary dilatation Pancreas: Unremarkable. No pancreatic ductal  dilatation or surrounding inflammatory changes. Spleen: Normal in size without focal abnormality. Adrenals/Urinary Tract: Adrenal glands are normal. Patient is status post cystectomy with right abdominal ileal conduit. Atrophy and cortical scarring right kidney. Left-sided percutaneous nephrostomy catheter, the distal tip is positioned within the ileum near the surgical sutures within the anterior mid upper pelvis. There is mild left hydronephrosis with soft tissue stranding at the left renal pelvis and left ureter. There is no right hydronephrosis or hydroureter. No definite calcified stones along the left ureteral stent. 4 mm stone lower pole left kidney. Stomach/Bowel: The stomach is nonenlarged. No dilated small bowel. No acute bowel wall thickening. Postsurgical changes of left lower quadrant bowel without obstruction or wall thickening. Vascular/Lymphatic: Moderate aortic atherosclerosis. No aneurysm. No suspicious lymph nodes. Reproductive: Status post hysterectomy. No adnexal masses. Other: Negative for pelvic effusion or free air. Edema within the left posterior flank soft tissues at the level of the nephrostomy catheter. Musculoskeletal: No acute osseous abnormality. IMPRESSION: 1. Patient is status post cystectomy with right abdominal ileal conduit. There is a left-sided percutaneous stent with the tip  terminating in the ileal segment/diversion adjacent to the mid line anterior pelvic sutures. There is mild to moderate left hydronephrosis but no definitive stone along the course of the stent. There is moderate left perinephric stranding and soft tissue stranding about the renal collecting system suggestive of superimposed infection. 2. Cortical scarring of the right kidney. Right kidney shows no hydronephrosis or obstructing stone. Electronically Signed   By: Donavan Foil M.D.   On: 10/23/2021 15:44    Procedures Procedures    Medications Ordered in ED Medications  cephALEXin (KEFLEX) capsule 500 mg (500 mg Oral Given 10/23/21 2009)    ED Course/ Medical Decision Making/ A&P Clinical Course as of 10/23/21 2212  [facility] Oct 23, 2021  1936 Case discussed with urology Dr. Christena Flake.  She recommends uncapping the for nephrostomy tube and placing a drainage bag on it.  If the drains appropriately in the nephrostomy tube pt may be discharged [JK]  2106 Nephrostomy tube was placed to catheter bag drainage.  Unfortunately patient still is leaking around the nephrostomy tube I will contact urology [JK]  2119 Discussed with Dr Christena Flake.  Will see patient in the ED. [JK]  2156 DIscussed with urology.  Will dc home with po abx [JK]  2159 Comprehensive metabolic panel(!) Creatinine slightly increased [JK]  2200 CBC with Differential Normal [JK]  2200 CT scan findings reviewed [JK]    Clinical Course User Index [JK] Dorie Rank, MD                           Medical Decision Making Problems Addressed: Cellulitis of trunk, unspecified site of trunk: acute illness or injury Malfunction of nephrostomy tube [facility]): acute illness or injury  Amount and/or Complexity of Data Reviewed Labs: ordered. Decision-making details documented in ED Course. Radiology: ordered.  Risk Prescription drug management.   Patient presented to the ED with new drainage from her nephrostomy tube.  No signs of acute kidney  injury.  CT scan showed mild signs of hydronephrosis but case discussed with urology.  This is not felt to be concerning and is somewhat expected after her recent procedure.  Patient's nephrostomy tube was hooked back to bag for drainage and unfortunately it still ended up draining around the tube site.  Patient was seen by urology in the ED.  She is not having any systemic signs of infection.  She otherwise appears well.  We will plan on discharge home with course of antibiotics as recommended by neurology for her flank inflammation.  We will have her follow-up closely with urologist as an outpatient.  No indication for acute intervention of her nephrostomy tube this evening per urology        Final Clinical Impression(s) / ED Diagnoses Final diagnoses:  Malfunction of nephrostomy tube ([place])  Cellulitis of trunk, unspecified site of trunk    Rx / DC Orders ED Discharge Orders          Ordered    cephALEXin (KEFLEX) 500 MG capsule  4 times daily        10/23/21 2158              Dorie Rank, MD 10/23/21 2212

## 2021-10-23 NOTE — ED Provider Triage Note (Signed)
Emergency Medicine Provider Triage Evaluation Note  Sarah Rush , a 62 y.o. female  was evaluated in triage.  Pt complains of nephrostomy tube problem.  Patient had surgery with Dr. Barbee Cough on September 22 for anterograde ureteroscopy. She has a nephroureteral stent in place with suprapubic drainage bag.  She says that on Friday she noticed some blood in the bag as well as leaking around the site to the point where she is waking up drenched in bed. This has gotten a lot worse since then and she has now developed some left flank pain.  She tried to call her urology office but has not had a call back yet. She reports decreased urine from the drainage bag as well.   Review of Systems  Positive:  Negative:   Physical Exam  Ht 5' 3.5" (1.613 m)   Wt 68.9 kg   BMI 26.50 kg/m  Gen:   Awake, no distress   Resp:  Normal effort  MSK:   Moves extremities without difficulty  Other:  Bloody drainage from suprapubic drain, about 250 ccs in bag  Medical Decision Making  Medically screening exam initiated at 2:33 PM.  Appropriate orders placed.  Darrold Span was informed that the remainder of the evaluation will be completed by another provider, this initial triage assessment does not replace that evaluation, and the importance of remaining in the ED until their evaluation is complete.     Adolphus Birchwood, [place] 10/23/21 1436

## 2021-10-23 NOTE — Anesthesia Postprocedure Evaluation (Signed)
Anesthesia Post Note  Patient: Sarah Rush  Procedure(s) Performed: ANTEGRADE URETEROSCOPY, STENT CHANGE, NEPHROSTOGRAM (Left)     Patient location during evaluation: PACU Anesthesia Type: General Level of consciousness: awake and alert Pain management: pain level controlled Vital Signs Assessment: post-procedure vital signs reviewed and stable Respiratory status: spontaneous breathing, nonlabored ventilation, respiratory function stable and patient connected to nasal cannula oxygen Cardiovascular status: blood pressure returned to baseline and stable Postop Assessment: no apparent nausea or vomiting Anesthetic complications: no   No notable events documented.  Last Vitals:  Vitals:   10/20/21 2132 10/21/21 0612  BP: 130/62 120/65  Pulse: 71 78  Resp: 18 18  Temp: 37.2 C 36.6 C  SpO2: 96% 93%    Last Pain:  Vitals:   10/21/21 0612  TempSrc: Oral  PainSc:                  Daxten Kovalenko

## 2022-04-09 ENCOUNTER — Ambulatory Visit (HOSPITAL_COMMUNITY)
Admission: RE | Admit: 2022-04-09 | Discharge: 2022-04-09 | Disposition: A | Discharge status: Home / Self Care | Payer: BC Managed Care – PPO | Source: Ambulatory Visit | Attending: Urology | Admitting: Urology

## 2022-04-09 ENCOUNTER — Other Ambulatory Visit (HOSPITAL_COMMUNITY): Payer: Self-pay | Admitting: Urology

## 2022-04-09 DIAGNOSIS — C678 Malignant neoplasm of overlapping sites of bladder: Secondary | ICD-10-CM | POA: Diagnosis not present

## 2023-04-14 ENCOUNTER — Emergency Department

## 2023-04-14 ENCOUNTER — Observation Stay
Admission: EM | Admit: 2023-04-14 | Discharge: 2023-04-14 | Disposition: A | Discharge status: Home / Self Care | Attending: Internal Medicine | Admitting: Internal Medicine

## 2023-04-14 ENCOUNTER — Other Ambulatory Visit: Payer: Self-pay

## 2023-04-14 DIAGNOSIS — Z8551 Personal history of malignant neoplasm of bladder: Secondary | ICD-10-CM | POA: Diagnosis not present

## 2023-04-14 DIAGNOSIS — C679 Malignant neoplasm of bladder, unspecified: Secondary | ICD-10-CM | POA: Diagnosis present

## 2023-04-14 DIAGNOSIS — X501XXA Overexertion from prolonged static or awkward postures, initial encounter: Secondary | ICD-10-CM | POA: Insufficient documentation

## 2023-04-14 DIAGNOSIS — S82851A Displaced trimalleolar fracture of right lower leg, initial encounter for closed fracture: Secondary | ICD-10-CM | POA: Diagnosis not present

## 2023-04-14 DIAGNOSIS — S82853A Displaced trimalleolar fracture of unspecified lower leg, initial encounter for closed fracture: Secondary | ICD-10-CM | POA: Diagnosis present

## 2023-04-14 DIAGNOSIS — S99911A Unspecified injury of right ankle, initial encounter: Secondary | ICD-10-CM | POA: Diagnosis present

## 2023-04-14 DIAGNOSIS — M810 Age-related osteoporosis without current pathological fracture: Secondary | ICD-10-CM

## 2023-04-14 DIAGNOSIS — M059 Rheumatoid arthritis with rheumatoid factor, unspecified: Secondary | ICD-10-CM | POA: Diagnosis present

## 2023-04-14 LAB — COMPREHENSIVE METABOLIC PANEL
ALT: 22 U/L (ref 0–44)
AST: 26 U/L (ref 15–41)
Albumin: 4.3 g/dL (ref 3.5–5.0)
Alkaline Phosphatase: 63 U/L (ref 38–126)
Anion gap: 11 (ref 5–15)
BUN: 20 mg/dL (ref 8–23)
CO2: 24 mmol/L (ref 22–32)
Calcium: 9.6 mg/dL (ref 8.9–10.3)
Chloride: 105 mmol/L (ref 98–111)
Creatinine, Ser: 1 mg/dL (ref 0.44–1.00)
GFR, Estimated: >60 mL/min (ref >60)
Glucose, Bld: 110 mg/dL — ABNORMAL HIGH (ref 70–99)
Potassium: 4.1 mmol/L (ref 3.5–5.1)
Sodium: 140 mmol/L (ref 135–145)
Total Bilirubin: 0.6 mg/dL (ref 0.0–1.2)
Total Protein: 7.3 g/dL (ref 6.5–8.1)

## 2023-04-14 LAB — CBC WITH DIFFERENTIAL/PLATELET
Abs Immature Granulocytes: 0.03 10*3/uL (ref 0.00–0.07)
Basophils Absolute: 0 10*3/uL (ref 0.0–0.1)
Basophils Relative: 0 %
Eosinophils Absolute: 0 10*3/uL (ref 0.0–0.5)
Eosinophils Relative: 0 %
HCT: 35.2 % — ABNORMAL LOW (ref 36.0–46.0)
Hemoglobin: 12.1 g/dL (ref 12.0–15.0)
Immature Granulocytes: 0 %
Lymphocytes Relative: 8 %
Lymphs Abs: 0.7 10*3/uL (ref 0.7–4.0)
MCH: 30.6 pg (ref 26.0–34.0)
MCHC: 34.4 g/dL (ref 30.0–36.0)
MCV: 89.1 fL (ref 80.0–100.0)
Monocytes Absolute: 0.7 10*3/uL (ref 0.1–1.0)
Monocytes Relative: 8 %
Neutro Abs: 7.2 10*3/uL (ref 1.7–7.7)
Neutrophils Relative %: 84 %
Platelets: 172 10*3/uL (ref 150–400)
RBC: 3.95 MIL/uL (ref 3.87–5.11)
RDW: 13.6 % (ref 11.5–15.5)
WBC: 8.7 10*3/uL (ref 4.0–10.5)
nRBC: 0 % (ref 0.0–0.2)

## 2023-04-14 MED ORDER — HYDROMORPHONE HCL 1 MG/ML IJ SOLN
0.5000 mg | Freq: Once | INTRAMUSCULAR | Status: AC
  Administered 2023-04-14: 0.5 mg via INTRAVENOUS
  Filled 2023-04-14: qty 0.5

## 2023-04-14 MED ORDER — ACETAMINOPHEN 325 MG PO TABS
650.0000 mg | ORAL_TABLET | Freq: Four times a day (QID) | ORAL | Status: DC
  Filled 2023-04-14: qty 2

## 2023-04-14 MED ORDER — ONDANSETRON HCL 4 MG/2ML IJ SOLN: 4.0000 mg | Freq: Four times a day (QID) | INTRAMUSCULAR | Status: DC | PRN

## 2023-04-14 MED ORDER — OXYCODONE HCL 5 MG PO TABS: 5.0000 mg | ORAL_TABLET | Freq: Four times a day (QID) | ORAL | Status: DC | PRN

## 2023-04-14 MED ORDER — HYDROMORPHONE HCL 1 MG/ML IJ SOLN: 0.5000 mg | INTRAMUSCULAR | Status: DC | PRN

## 2023-04-14 MED ORDER — POLYETHYLENE GLYCOL 3350 17 G PO PACK: 17.0000 g | PACK | Freq: Every day | ORAL | Status: DC | PRN

## 2023-04-14 MED ORDER — MORPHINE SULFATE (PF) 4 MG/ML IV SOLN
4.0000 mg | Freq: Once | INTRAVENOUS | Status: AC
  Administered 2023-04-14: 4 mg via INTRAVENOUS
  Filled 2023-04-14: qty 1

## 2023-04-14 MED ORDER — ACETAMINOPHEN 650 MG RE SUPP: 650.0000 mg | Freq: Four times a day (QID) | RECTAL | Status: DC

## 2023-04-14 MED ORDER — SODIUM CHLORIDE 0.9% FLUSH: 3.0000 mL | Freq: Two times a day (BID) | INTRAVENOUS | Status: DC

## 2023-04-14 MED ORDER — ONDANSETRON HCL 4 MG PO TABS: 4.0000 mg | ORAL_TABLET | Freq: Four times a day (QID) | ORAL | Status: DC | PRN

## 2023-04-14 MED ORDER — OXYCODONE HCL 5 MG PO TABS: 5.0000 mg | ORAL_TABLET | Freq: Four times a day (QID) | ORAL | 0 refills | Status: DC | PRN

## 2023-04-14 NOTE — Assessment & Plan Note (Signed)
 Patient has a history of rheumatoid arthritis on Plaquenil and Rinvoq.  No recent exacerbations.  - Resume home regimen

## 2023-04-14 NOTE — Assessment & Plan Note (Signed)
 S/p total bladder resection with ileal conduit.  No reoccurrence.

## 2023-04-14 NOTE — ED Triage Notes (Signed)
 Pt to ED via [facility] for mechanical fall, slipped. Deformity to R ankle.  18#, fentanyl given.  EMS VS: 96% RA, 176/81, RR 11, 84 HR, CBG was 150  No thinners, no LOC, no head trauma

## 2023-04-14 NOTE — Discharge Summary (Signed)
 Physician Discharge Summary   Patient: Sarah Rush MRN: [id number] DOB: [birth year]  Admit date:     04/14/2023  Discharge date: 04/14/23  Discharge Physician: Verdene Lennert   PCP: Jenell Milliner, MD   Recommendations at discharge:    Please follow up with podiatry on Tuesday, March 18  Discharge Diagnoses: Principal Problem:   Trimalleolar fracture Active Problems:   Rheumatoid arthritis, seropositive (HCC)   Osteoporosis   Bladder cancer (HCC)  Resolved Problems:   * No resolved hospital problems. [facility] Course: On arrival to the ED, patient was hypertensive at 153/59 with heart rate of 80.  She was saturating at 97% on room air.  She was afebrile at 98.1. Initial x-ray with trimalleolar fracture s/p reduction by EDP.  CT of the ankle obtained with evidence of the same.  In addition to surrounding soft tissue swelling and ill-defined hematoma.  CBC and CMP still pending.  Podiatry was consulted and stated to plan for surgery tomorrow.  Due to this, [facility] contacted for admission.   Shortly afterwards, Dr. Jamse Arn with [facility] evaluated patient and unfortunately, she is unable to be put on the schedule for surgery tomorrow. He offered outpatient follow up. I then consulted with both orthopedic surgery and [facility] podiatry. Neither are able to accommodate surgery within the next 24 hours.  Discussed with patient that her options are either to discharge home on strict nonweightbearing status and follow-up with podiatry outpatient VS staying overnight and reaching out again to podiatry or orthopedic surgery tomorrow morning for surgery on the 18th.  Patient opted to go home.  Discharge orders placed.  Assessment and Plan:  * Trimalleolar fracture Secondary to mechanical ground-level fall.  - Podiatry consulted; appreciate their recommendations  Rheumatoid arthritis, seropositive (HCC) Patient has a history of rheumatoid arthritis on Plaquenil and Rinvoq.  No recent  exacerbations.  - Resume home regimen  Osteoporosis History of osteoporosis on Alendronate.   Bladder cancer [facility]) S/p total bladder resection with ileal conduit.  No reoccurrence.   Pain control - [employer] Controlled Substance Reporting System database was reviewed. and patient was instructed, not to drive, operate heavy machinery, perform activities at heights, swimming or participation in water activities or provide baby-sitting services while on Pain, Sleep and Anxiety Medications; until their outpatient Physician has advised to do so again. Also recommended to not to take more than prescribed Pain, Sleep and Anxiety Medications.   Consultants: Podiatry Procedures performed: None  Disposition: Home Diet recommendation:  Regular diet DISCHARGE MEDICATION: Allergies as of 04/14/2023   No Known Allergies      Medication List     TAKE these medications    acetaminophen 500 MG tablet Commonly known as: TYLENOL Take 500-1,000 mg by mouth every 6 (six) hours as needed for mild pain or headache.   alendronate 70 MG tablet Commonly known as: FOSAMAX Take 70 mg by mouth once a week. Take with a full glass of water on an empty stomach.   fluticasone 50 MCG/ACT nasal spray Commonly known as: FLONASE Place 1 spray into both nostrils at bedtime.   hydroxychloroquine 200 MG tablet Commonly known as: PLAQUENIL Take 200 mg by mouth in the morning.   metoprolol succinate 25 MG 24 hr tablet Commonly known as: TOPROL-XL Take 25 mg by mouth daily.   ONE-A-DAY WOMENS PO Take 1 tablet by mouth daily with breakfast.   oxyCODONE 5 MG immediate release tablet Commonly known as: Oxy IR/ROXICODONE Take 1-2 tablets (5-10 mg total)  by mouth every 6 (six) hours as needed for moderate pain (pain score 4-6) or severe pain (pain score 7-10).   PriLOSEC OTC 20 MG tablet Generic drug: omeprazole Take 20 mg by mouth daily as needed (for reflux symptoms).   Rinvoq 15 MG Tb24 Generic  drug: Upadacitinib ER Take 15 mg by mouth daily.   sertraline 100 MG tablet Commonly known as: ZOLOFT Take 100 mg by mouth daily.   Vitamin D3 25 MCG (1000 UT) Caps Take 3,000 Units by mouth daily.        Follow-up Information     Felecia Shelling, DPM. Call in 1 day(s).   Specialty: Podiatry Contact information: [address] [place] [address] [phone]                Discharge Exam: Ceasar Mons Weights   04/14/23 1528  Weight: 71.2 kg   Physical Exam Vitals and nursing note reviewed.  Constitutional:      General: She is not in acute distress.    Appearance: She is normal weight.  Pulmonary:     Effort: Pulmonary effort is normal. No respiratory distress.  Neurological:     Mental Status: She is alert.  Psychiatric:        Mood and Affect: Mood normal.        Behavior: Behavior normal.    Condition at discharge: stable  The results of significant diagnostics from this hospitalization (including imaging, microbiology, ancillary and laboratory) are listed below for reference.   Imaging Studies: CT Ankle Right Wo Contrast Result Date: 04/14/2023 CLINICAL DATA:  Trimalleolar right ankle fracture EXAM: CT OF THE RIGHT ANKLE WITHOUT CONTRAST TECHNIQUE: Multidetector CT imaging of the right ankle was performed according to the standard protocol. Multiplanar CT image reconstructions were also generated. RADIATION DOSE REDUCTION: This exam was performed according to the departmental dose-optimization program which includes automated exposure control, adjustment of the mA and/or kV according to patient size and/or use of iterative reconstruction technique. COMPARISON:  Same-day x-ray FINDINGS: Bones/Joint/Cartilage Acute trimalleolar fracture of the right ankle. Obliquely oriented, comminuted fracture through the lateral malleolus and distal fibular metaphysis with 8 mm of posterior displacement as well as mild shortening. Vertically oriented posterior malleolar  fracture extending to the central aspect of the tibial plafond with approximately 7 mm of articular-surface diastasis centrally and mild articular surface depression. Transversely oriented fracture through the base of the medial malleolus with mild fracture distraction. Posterior subluxation at the tibiotalar joint. No dislocation. There are several small fracture fragments in the tibiotalar joint space. Remaining bones of the hindfoot and midfoot appear intact without fracture or dislocation. Mild osteoarthritic changes. Ligaments Suboptimally assessed by CT. Muscles and Tendons No acute musculotendinous abnormality by CT. No obvious signs of tendon entrapment. Soft tissues Soft tissue swelling and ill-defined hematoma is most pronounced laterally. No soft tissue air to suggest open fracture. IMPRESSION: Acute trimalleolar fracture-subluxation of the right ankle, as described above. Electronically Signed   By: Duanne Guess D.O.   On: 04/14/2023 17:19   DG Ankle Complete Right Result Date: 04/14/2023 CLINICAL DATA:  pain post fall. EXAM: RIGHT ANKLE - COMPLETE 3+ VIEW COMPARISON:  None Available. FINDINGS: There is oblique mildly displaced fracture of the posterior malleolus. There is mildly displaced fracture of the medial malleolus and moderately displaced oblique fracture of the distal right fibula. No other acute fracture or dislocation. No aggressive osseous lesion. Calcaneal spur noted along the Achilles tendon and Plantar aponeurosis attachment sites. No significant degenerative changes  of imaged joints. No focal soft tissue swelling. No radiopaque foreign bodies. IMPRESSION: *Trimalleolar fracture, as described above. Electronically Signed   By: Jules Schick M.D.   On: 04/14/2023 16:04    Microbiology: Results for orders placed or performed during the hospital encounter of 10/06/21  Urine Culture     Status: Abnormal   Collection Time: 10/06/21 11:30 AM   Specimen: Urine, Clean Catch  Result  Value Ref Range Status   Specimen Description   Final    URINE, CLEAN CATCH Performed at [facility], [address] [place], [place] [address]    Special Requests   Final    NONE Performed at [facility], [address] [place], [place] [address]    Culture >=100,000 COLONIES/mL ESCHERICHIA COLI (A)  Final   Report Status 10/08/2021 FINAL  Final   Organism ID, Bacteria ESCHERICHIA COLI (A)  Final      Susceptibility   Escherichia coli - MIC*    AMPICILLIN <=2 SENSITIVE Sensitive     CEFAZOLIN <=4 SENSITIVE Sensitive     CEFEPIME <=0.12 SENSITIVE Sensitive     CEFTRIAXONE <=0.25 SENSITIVE Sensitive     CIPROFLOXACIN <=0.25 SENSITIVE Sensitive     GENTAMICIN <=1 SENSITIVE Sensitive     IMIPENEM <=0.25 SENSITIVE Sensitive     NITROFURANTOIN <=16 SENSITIVE Sensitive     TRIMETH/SULFA <=20 SENSITIVE Sensitive     AMPICILLIN/SULBACTAM <=2 SENSITIVE Sensitive     PIP/TAZO <=4 SENSITIVE Sensitive     * >=100,000 COLONIES/mL ESCHERICHIA COLI  Culture, blood (Routine X 2) w Reflex to ID Panel     Status: Abnormal   Collection Time: 10/06/21 11:44 AM   Specimen: BLOOD  Result Value Ref Range Status   Specimen Description   Final    BLOOD LEFT ANTECUBITAL Performed at [facility], [address] [place], [place] [address]    Special Requests   Final    BOTTLES DRAWN AEROBIC AND ANAEROBIC Blood Culture results may not be optimal due to an inadequate volume of blood received in culture bottles Performed at [facility], [address] [place], [place] [address]    Culture  Setup Time   Final    IN BOTH AEROBIC AND ANAEROBIC BOTTLES GRAM NEGATIVE RODS Organism ID to follow CRITICAL RESULT CALLED TO, READ BACK BY AND VERIFIED WITH:  C/ PHARMD E. JACKSON 10/07/21 0235 A. LAFRANCE Performed at [facility], [address] [place], [place] [address]    Culture ESCHERICHIA COLI (A)  Final   Report  Status 10/09/2021 FINAL  Final   Organism ID, Bacteria ESCHERICHIA COLI  Final      Susceptibility   Escherichia coli - MIC*    AMPICILLIN <=2 SENSITIVE Sensitive     CEFAZOLIN <=4 SENSITIVE Sensitive     CEFEPIME <=0.12 SENSITIVE Sensitive     CEFTAZIDIME <=1 SENSITIVE Sensitive     CEFTRIAXONE <=0.25 SENSITIVE Sensitive     CIPROFLOXACIN <=0.25 SENSITIVE Sensitive     GENTAMICIN <=1 SENSITIVE Sensitive     IMIPENEM <=0.25 SENSITIVE Sensitive     TRIMETH/SULFA <=20 SENSITIVE Sensitive     AMPICILLIN/SULBACTAM <=2 SENSITIVE Sensitive     PIP/TAZO <=4 SENSITIVE Sensitive     * ESCHERICHIA COLI  Culture, blood (Routine X 2) w Reflex to ID Panel     Status: Abnormal   Collection Time: 10/06/21 11:44 AM   Specimen: BLOOD RIGHT ARM  Result Value Ref Range Status  Specimen Description   Final    BLOOD RIGHT ARM Performed at [facility] [address] [place], [place] [address]    Special Requests   Final    BOTTLES DRAWN AEROBIC AND ANAEROBIC Blood Culture results may not be optimal due to an inadequate volume of blood received in culture bottles Performed at [facility], [address] [place], [place] [address]    Culture  Setup Time   Final    AEROBIC BOTTLE ONLY GRAM NEGATIVE RODS CRITICAL VALUE NOTED.  VALUE IS CONSISTENT WITH PREVIOUSLY REPORTED AND CALLED VALUE.    Culture (A)  Final    ESCHERICHIA COLI SUSCEPTIBILITIES PERFORMED ON PREVIOUS CULTURE WITHIN THE LAST 5 DAYS. Performed at [facility], [address], [place], [place] [address]    Report Status 10/09/2021 FINAL  Final  Blood Culture ID Panel (Reflexed)     Status: Abnormal   Collection Time: 10/06/21 11:44 AM  Result Value Ref Range Status   Enterococcus faecalis NOT DETECTED NOT DETECTED Final   Enterococcus Faecium NOT DETECTED NOT DETECTED Final   Listeria monocytogenes NOT DETECTED NOT DETECTED Final   Staphylococcus species NOT DETECTED NOT DETECTED  Final   Staphylococcus aureus (BCID) NOT DETECTED NOT DETECTED Final   Staphylococcus epidermidis NOT DETECTED NOT DETECTED Final   Staphylococcus lugdunensis NOT DETECTED NOT DETECTED Final   Streptococcus species NOT DETECTED NOT DETECTED Final   Streptococcus agalactiae NOT DETECTED NOT DETECTED Final   Streptococcus pneumoniae NOT DETECTED NOT DETECTED Final   Streptococcus pyogenes NOT DETECTED NOT DETECTED Final   A.calcoaceticus-baumannii NOT DETECTED NOT DETECTED Final   Bacteroides fragilis NOT DETECTED NOT DETECTED Final   Enterobacterales DETECTED (A) NOT DETECTED Final    Comment: Enterobacterales represent a large order of gram negative bacteria, not a single organism. CRITICAL RESULT CALLED TO, READ BACK BY AND VERIFIED WITH:  C/ PHARMD E. JACKSON 10/07/21 0235 A. LAFRANCE    Enterobacter cloacae complex NOT DETECTED NOT DETECTED Final   Escherichia coli DETECTED (A) NOT DETECTED Final    Comment: CRITICAL RESULT CALLED TO, READ BACK BY AND VERIFIED WITH:  C/ PHARMD E. JACKSON 10/07/21 0235 A. LAFRANCE    Klebsiella aerogenes NOT DETECTED NOT DETECTED Final   Klebsiella oxytoca NOT DETECTED NOT DETECTED Final   Klebsiella pneumoniae NOT DETECTED NOT DETECTED Final   Proteus species NOT DETECTED NOT DETECTED Final   Salmonella species NOT DETECTED NOT DETECTED Final   Serratia marcescens NOT DETECTED NOT DETECTED Final   Haemophilus influenzae NOT DETECTED NOT DETECTED Final   Neisseria meningitidis NOT DETECTED NOT DETECTED Final   Pseudomonas aeruginosa NOT DETECTED NOT DETECTED Final   Stenotrophomonas maltophilia NOT DETECTED NOT DETECTED Final   Candida albicans NOT DETECTED NOT DETECTED Final   Candida auris NOT DETECTED NOT DETECTED Final   Candida glabrata NOT DETECTED NOT DETECTED Final   Candida krusei NOT DETECTED NOT DETECTED Final   Candida parapsilosis NOT DETECTED NOT DETECTED Final   Candida tropicalis NOT DETECTED NOT DETECTED Final   Cryptococcus  neoformans/gattii NOT DETECTED NOT DETECTED Final   CTX-M ESBL NOT DETECTED NOT DETECTED Final   Carbapenem resistance IMP NOT DETECTED NOT DETECTED Final   Carbapenem resistance KPC NOT DETECTED NOT DETECTED Final   Carbapenem resistance NDM NOT DETECTED NOT DETECTED Final   Carbapenem resist OXA 48 LIKE NOT DETECTED NOT DETECTED Final   Carbapenem resistance VIM NOT DETECTED NOT DETECTED Final    Comment: Performed at [facility], [address], [place], [place] [address]    Labs: CBC: Recent Labs  Lab 04/14/23 1641  WBC 8.7  NEUTROABS 7.2  HGB 12.1  HCT 35.2*  MCV 89.1  PLT 172   Basic Metabolic Panel: Recent Labs  Lab 04/14/23 1641  NA 140  K 4.1  CL 105  CO2 24  GLUCOSE 110*  BUN 20  CREATININE 1.00  CALCIUM 9.6   Liver Function Tests: Recent Labs  Lab 04/14/23 1641  AST 26  ALT 22  ALKPHOS 63  BILITOT 0.6  PROT 7.3  ALBUMIN 4.3   CBG: No results for input(s): "GLUCAP" in the last 168 hours.  Discharge time spent: less than 30 minutes.  Signed: Verdene Lennert, MD [facility] 04/14/2023

## 2023-04-14 NOTE — ED Notes (Signed)
 Written and verbal discharge instructions were given to pt, understanding verbalized. Pt instructed on use of crutches, return demonstration by pt. Pt discharged to personal vehicle with significant other driving.

## 2023-04-14 NOTE — Assessment & Plan Note (Signed)
 History of osteoporosis on Alendronate.

## 2023-04-14 NOTE — ED Notes (Signed)
 Ankle was reduced and splint placed by EDP and NP.

## 2023-04-14 NOTE — H&P (Signed)
 History and Physical    Patient: Sarah Rush OZH:[id number] DOB: May 27, 1960 DOA: 04/14/2023 DOS: the patient was seen and examined on 04/14/2023 PCP: Jenell Milliner, MD  Patient coming from: Home  Chief Complaint:  Chief Complaint  Patient presents with   Fall   HPI: Sarah Rush is a 63 y.o. female with medical history significant of rheumatoid arthritis on Rinvoq and Plaquenil, osteoporosis on Alendronate, SVT, bladder cancer s/p total bladder resection w/ ileal conduit (2022), depression/anxiety, who presents to the ED 2/2 ground level fall.   [name] states she was in her normal state of health earlier this afternoon when she was walking on her deck and slipped.  Her ankle turned outwards when she fell to the ground.  Prior to the fall, she denies any chest pain, shortness of breath, palpitations, dizziness or headache.  She did not hit her head or lose consciousness.  ED Course:  On arrival to the ED, patient was hypertensive at 153/59 with heart rate of 80.  She was saturating at 97% on room air.  She was afebrile at 98.1. Initial x-ray with trimalleolar fracture s/p reduction by EDP.  CT of the ankle obtained with evidence of the same.  In addition to surrounding soft tissue swelling and ill-defined hematoma.  CBC and CMP still pending.  Podiatry was consulted and stated to plan for surgery tomorrow.  Due to this, [facility] contacted for admission.  Review of Systems: As mentioned in the history of present illness. All other systems reviewed and are negative.  Past Medical History:  Diagnosis Date   Anxiety    Cancer (HCC)    bladder   GERD (gastroesophageal reflux disease)    History of kidney stones    Lower abdominal pain    Rheumatoid arthritis (HCC)    Urinary tract infection symptoms    Past Surgical History:  Procedure Laterality Date   ABDOMINAL HYSTERECTOMY     CESAREAN SECTION  1982   CHOLECYSTECTOMY     CYSTOSCOPY W/ RETROGRADES Bilateral  07/02/2019   Procedure: CYSTOSCOPY WITH RETROGRADE PYELOGRAM;  Surgeon: Vanna Scotland, MD;  Location: [facility];  Service: Urology;  Laterality: Bilateral;   CYSTOSCOPY W/ RETROGRADES Bilateral 12/21/2019   Procedure: CYSTOSCOPY WITH RETROGRADE PYELOGRAM;  Surgeon: Vanna Scotland, MD;  Location: [facility];  Service: Urology;  Laterality: Bilateral;   CYSTOSCOPY WITH BIOPSY N/A 12/22/2018   Procedure: CYSTOSCOPY WITH BIOPSY;  Surgeon: Vanna Scotland, MD;  Location: [facility];  Service: Urology;  Laterality: N/A;   CYSTOSCOPY WITH INJECTION N/A 04/08/2020   Procedure: CYSTOSCOPY WITH INJECTION OF INDOCYANINE GREEN DYE;  Surgeon: Sebastian Ache, MD;  Location: [facility];  Service: Urology;  Laterality: N/A;   IR NEPHROSTOMY PLACEMENT LEFT  10/06/2021   LYMPHADENECTOMY Bilateral 04/08/2020   Procedure: LYMPHADENECTOMY;  Surgeon: Sebastian Ache, MD;  Location: [facility];  Service: Urology;  Laterality: Bilateral;   REVISION UROSTOMY CUTANEOUS Right    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   ROBOT ASSISTED LAPAROSCOPIC COMPLETE CYSTECT ILEAL CONDUIT N/A 04/08/2020   Procedure: XI ROBOTIC ASSISTED LAPAROSCOPIC COMPLETE CYSTECT , TOTAL ABDOMINAL HYSTERECTOMY, BILATERAL SALPINGO-OOPHORECTOMY AND ILEAL CONDUIT;  Surgeon: Sebastian Ache, MD;  Location: [facility];  Service: Urology;  Laterality: N/A;  6 HRS   TRANSURETHRAL RESECTION OF BLADDER TUMOR WITH MITOMYCIN-C N/A 12/22/2018   Procedure: TRANSURETHRAL RESECTION OF BLADDER TUMOR WITH Gemcitabine;  Surgeon: Vanna Scotland, MD;  Location: [facility];  Service: Urology;  Laterality: N/A;   TRANSURETHRAL RESECTION OF BLADDER TUMOR WITH MITOMYCIN-C  N/A 07/02/2019   Procedure: TRANSURETHRAL RESECTION OF BLADDER TUMOR WITH Gemcitabine;  Surgeon: Vanna Scotland, MD;  Location: [facility];  Service: Urology;  Laterality: N/A;   TRANSURETHRAL RESECTION OF BLADDER TUMOR WITH MITOMYCIN-C N/A 12/21/2019   Procedure: TRANSURETHRAL RESECTION OF BLADDER TUMOR WITH Gemcitabine;   Surgeon: Vanna Scotland, MD;  Location: [facility];  Service: Urology;  Laterality: N/A;   TRANSURETHRAL RESECTION OF BLADDER TUMOR WITH MITOMYCIN-C N/A 02/01/2020   Procedure: TRANSURETHRAL RESECTION OF BLADDER TUMOR WITH Gemcitabine;  Surgeon: Vanna Scotland, MD;  Location: [facility];  Service: Urology;  Laterality: N/A;   Social History:  reports that she quit smoking about 9 years ago. Her smoking use included cigarettes. She has never used smokeless tobacco. She reports current alcohol use. She reports current drug use. Frequency: 1.00 time per week. Drug: Marijuana.  No Known Allergies  Family History  Problem Relation Age of Onset   Hypertension Other     Prior to Admission medications   Medication Sig Start Date End Date Taking? Authorizing Provider  acetaminophen (TYLENOL) 500 MG tablet Take 500-1,000 mg by mouth every 6 (six) hours as needed for mild pain or headache.    [provider]  cephALEXin (KEFLEX) 500 MG capsule Take 1 capsule (500 mg total) by mouth 4 (four) times daily. 10/23/21   Linwood Dibbles, MD  Cholecalciferol (VITAMIN D3) 25 MCG (1000 UT) CAPS Take 3,000 Units by mouth daily.    [provider]  HUMIRA PEN 40 MG/0.4ML PNKT Inject 40 mg into the skin every 14 (fourteen) days. 09/26/21   [provider]  hydroxychloroquine (PLAQUENIL) 200 MG tablet Take 200 mg by mouth in the morning. 05/08/19   [provider]  loratadine (CLARITIN) 10 MG tablet Take 10 mg by mouth daily.    [provider]  Multiple Vitamins-Minerals (ONE-A-DAY WOMENS PO) Take 1 tablet by mouth daily with breakfast.    [provider]  ondansetron (ZOFRAN) 4 MG tablet Take 1 tablet (4 mg total) by mouth every 6 (six) hours as needed for nausea. 10/08/21   Joseph Art, DO  PRILOSEC OTC 20 MG tablet Take 20 mg by mouth daily as needed (for reflux symptoms).    [provider]  sertraline (ZOLOFT) 100 MG tablet Take 100 mg by mouth daily.  09/25/21   [provider]    Physical Exam: Vitals:   04/14/23 1527 04/14/23 1528  BP: (!) 153/59   Pulse: 80   Resp: 20   Temp: 98.1 F (36.7 C)   TempSrc: Oral   SpO2: 97%   Weight:  71.2 kg  Height:  5\' 4"  (1.626 m)   Physical Exam Vitals and nursing note reviewed.  Constitutional:      General: She is not in acute distress.    Appearance: She is not toxic-appearing.  HENT:     Head: Normocephalic and atraumatic.     Mouth/Throat:     Mouth: Mucous membranes are moist.     Pharynx: Oropharynx is clear.  Eyes:     Conjunctiva/sclera: Conjunctivae normal.     Pupils: Pupils are equal, round, and reactive to light.  Cardiovascular:     Rate and Rhythm: Normal rate and regular rhythm.     Heart sounds: No murmur heard. Pulmonary:     Effort: Pulmonary effort is normal. No respiratory distress.     Breath sounds: Normal breath sounds. No wheezing, rhonchi or rales.  Abdominal:     General: Bowel sounds are normal.  There is no distension.     Palpations: Abdomen is soft.     Tenderness: There is no abdominal tenderness. There is no guarding.  Musculoskeletal:     Left lower leg: No edema.  Skin:    General: Skin is warm and dry.     Comments: Bruises on the left superior medial calf.  Right lower extremity wrapped.  Neurological:     Mental Status: She is alert and oriented to person, place, and time. Mental status is at baseline.  Psychiatric:        Mood and Affect: Mood normal.        Behavior: Behavior normal.    Data Reviewed: CBC with WBC of 8.7, hemoglobin of 12.1, and platelets of 172 CMP pending  CT Ankle Right Wo Contrast Result Date: 04/14/2023 CLINICAL DATA:  Trimalleolar right ankle fracture EXAM: CT OF THE RIGHT ANKLE WITHOUT CONTRAST TECHNIQUE: Multidetector CT imaging of the right ankle was performed according to the standard protocol. Multiplanar CT image reconstructions were also generated. RADIATION DOSE REDUCTION: This exam was  performed according to the departmental dose-optimization program which includes automated exposure control, adjustment of the mA and/or kV according to patient size and/or use of iterative reconstruction technique. COMPARISON:  Same-day x-ray FINDINGS: Bones/Joint/Cartilage Acute trimalleolar fracture of the right ankle. Obliquely oriented, comminuted fracture through the lateral malleolus and distal fibular metaphysis with 8 mm of posterior displacement as well as mild shortening. Vertically oriented posterior malleolar fracture extending to the central aspect of the tibial plafond with approximately 7 mm of articular-surface diastasis centrally and mild articular surface depression. Transversely oriented fracture through the base of the medial malleolus with mild fracture distraction. Posterior subluxation at the tibiotalar joint. No dislocation. There are several small fracture fragments in the tibiotalar joint space. Remaining bones of the hindfoot and midfoot appear intact without fracture or dislocation. Mild osteoarthritic changes. Ligaments Suboptimally assessed by CT. Muscles and Tendons No acute musculotendinous abnormality by CT. No obvious signs of tendon entrapment. Soft tissues Soft tissue swelling and ill-defined hematoma is most pronounced laterally. No soft tissue air to suggest open fracture. IMPRESSION: Acute trimalleolar fracture-subluxation of the right ankle, as described above. Electronically Signed   By: Duanne Guess D.O.   On: 04/14/2023 17:19   DG Ankle Complete Right Result Date: 04/14/2023 CLINICAL DATA:  pain post fall. EXAM: RIGHT ANKLE - COMPLETE 3+ VIEW COMPARISON:  None Available. FINDINGS: There is oblique mildly displaced fracture of the posterior malleolus. There is mildly displaced fracture of the medial malleolus and moderately displaced oblique fracture of the distal right fibula. No other acute fracture or dislocation. No aggressive osseous lesion. Calcaneal spur noted  along the Achilles tendon and Plantar aponeurosis attachment sites. No significant degenerative changes of imaged joints. No focal soft tissue swelling. No radiopaque foreign bodies. IMPRESSION: *Trimalleolar fracture, as described above. Electronically Signed   By: Jules Schick M.D.   On: 04/14/2023 16:04   Results are pending, will review when available.  Assessment and Plan:  * Trimalleolar fracture Secondary to mechanical ground-level fall.  - Podiatry consulted; appreciate their recommendations - N.p.o. after midnight given tentative plans for surgery - Pain control with Tylenol, oxycodone and Dilaudid  Rheumatoid arthritis, seropositive (HCC) Patient has a history of rheumatoid arthritis on Plaquenil and Rinvoq.  No recent exacerbations.  - Resume home regimen  Osteoporosis History of osteoporosis on Alendronate.   Bladder cancer [facility]) S/p total bladder resection with ileal conduit.  No reoccurrence.  Advance Care  Planning:   Code Status: Full Code   Consults: Podiatry  Family Communication: Patient's husband and daughter updated at bedside  Severity of Illness: The appropriate patient status for this patient is OBSERVATION. Observation status is judged to be reasonable and necessary in order to provide the required intensity of service to ensure the patient's safety. The patient's presenting symptoms, physical exam findings, and initial radiographic and laboratory data in the context of their medical condition is felt to place them at decreased risk for further clinical deterioration. Furthermore, it is anticipated that the patient will be medically stable for discharge from the hospital within 2 midnights of admission.   Author: Verdene Lennert, MD 04/14/2023 5:46 PM  For on call review [email or web address].

## 2023-04-14 NOTE — Discharge Instructions (Addendum)
 Mrs. Sarah Rush, Sarah Rush were seen in the ER due to an ankle fracture that required a reduction.  This fracture will require surgery, however unfortunately our surgeons are not able to operate tomorrow and have offered to see you in their clinic on Tuesday, 3/18. We discussed staying in the hospital for surgery later in the week, but you opted to go home at this time.    At home, please make sure to not put any weight on your right foot.  Keep the dressing dry and clean.  For pain control, you can take Tylenol 650 mg every 6 hours as needed.  In addition, I have also sent in a prescription for Oxycodone. You can take 1-2 tablets every 6-8 hours as needed.   It was a pleasure meeting you.   Sincerely,  Dr. Huel Cote

## 2023-04-14 NOTE — ED Provider Notes (Signed)
 [facility] Provider Note    Event Date/Time   First MD Initiated Contact with Patient 04/14/23 1520     (approximate)   History   Fall   HPI  Sarah Rush is a 63 y.o. female  with history of RA, bladder cancer, AKI, and as listed in EMR presents to the emergency department for treatment and evaluation of right ankle pain after slipping on her deck and ankle rolled outward. She denies other injury. She did not hit her head or lose consciousness. She arrives via EMS who applied SAM splint and has given her Fentanyl . EMS report ankle deformity noted without open wounds..      Physical Exam   Triage Vital Signs: ED Triage Vitals  Encounter Vitals Group     BP 04/14/23 1527 (!) 153/59     Systolic BP Percentile --      Diastolic BP Percentile --      Pulse Rate 04/14/23 1527 80     Resp 04/14/23 1527 20     Temp 04/14/23 1527 98.1 F (36.7 C)     Temp Source 04/14/23 1527 Oral     SpO2 04/14/23 1527 97 %     Weight 04/14/23 1528 157 lb (71.2 kg)     Height 04/14/23 1528 5\' 4"  (1.626 m)     Head Circumference --      Peak Flow --      Pain Score 04/14/23 1525 8     Pain Loc --      Pain Education --      Exclude from Growth Chart --     Most recent vital signs: Vitals:   04/14/23 1527  BP: (!) 153/59  Pulse: 80  Resp: 20  Temp: 98.1 F (36.7 C)  SpO2: 97%    General: Awake, no distress.  CV:  Good peripheral perfusion.  Resp:  Normal effort.  Abd:  No distention.  Other:  No pain along the length of the spine. No pain in upper extremities and demonstrates ROM. No pain in either hip. No tenderness over proximal fibula/tibia of right lower extremity. No tenderness in left lower extremity. Superficial abrasion to right lateral malleolus. No open wounds on medial aspect. DP pulse 2+   ED Results / Procedures / Treatments   Labs (all labs ordered are listed, but only abnormal results are displayed) Labs Reviewed   COMPREHENSIVE METABOLIC PANEL  CBC WITH DIFFERENTIAL/PLATELET     EKG  Not indicated   RADIOLOGY  Image and radiology report reviewed and interpreted by me. Radiology report consistent with the same.  Trimalleolar fracture evident on x-ray  PROCEDURES:  Critical Care performed: No  .Reduction of dislocation  Date/Time: 04/14/2023 5:08 PM  Performed by: Chinita Pester, FNP Authorized by: Chinita Pester, FNP  Risks and benefits: risks, benefits and alternatives were discussed Consent given by: patient Patient understanding: patient states understanding of the procedure being performed Imaging studies: imaging studies available Patient identity confirmed: verbally with patient Patient tolerance: patient tolerated the procedure well with no immediate complications Comments: Right ankle trimalleolar fracture reduced with traction to the great toe and movement of the heel. Realignment appears successful. CT imaging pending.   Marland KitchenSplint Application  Date/Time: 04/14/2023 5:20 PM  Performed by: Chinita Pester, FNP Authorized by: Chinita Pester, FNP   Consent:    Consent obtained:  Verbal   Consent given by:  Patient   Risks discussed:  Pain Universal protocol:  Procedure explained and questions answered to patient or proxy's satisfaction: yes     Patient identity confirmed:  Verbally with patient Pre-procedure details:    Distal neurologic exam:  Normal   Distal perfusion: distal pulses strong   Procedure details:    Location:  Ankle   Ankle location:  R ankle   Cast type:  Short leg   Splint type:  Ankle stirrup   Supplies:  Cotton padding, fiberglass and elastic bandage Post-procedure details:    Distal neurologic exam:  Normal   Distal perfusion: distal pulses strong     Procedure completion:  Tolerated well, no immediate complications    MEDICATIONS ORDERED IN ED:  Medications  morphine (PF) 4 MG/ML injection 4 mg (4 mg Intravenous Given 04/14/23  1546)  HYDROmorphone (DILAUDID) injection 0.5 mg (0.5 mg Intravenous Given 04/14/23 1636)     IMPRESSION / MDM / ASSESSMENT AND PLAN / ED COURSE   I have reviewed the triage note.  Differential diagnosis includes, but is not limited to, trimalleolar fracture, bimalleolar fracture, ankle sprain  Patient's presentation is most consistent with acute presentation with potential threat to life or bodily function.  63 year old female presenting to the emergency department for treatment and evaluation after slipping on her deck and twisting her ankle.  She did fall to the ground but denies striking her head or loss of consciousness.  EMS reports deformity of the right ankle on their assessment and she was placed in a SAM splint.  Splint removed for imaging.  Deformity is noted.  No open wounds to the skin with the exception of a very superficial small abrasion to the lateral malleolus.  Imaging confirms suspicion of trimalleolar fracture.  ----------------------------------------- 4:25 PM on 04/14/2023 ----------------------------------------- Results discussed with patient.   Case discussed with Dr. Jamse Arn, podiatry on call, who advised admission via hospitalist service and he will advise Dr. Annamary Rummage who will likely take her to the OR tomorrow. Reduction planned after she gets an additional dose of pain medication then CT.  ----------------------------------------- 5:10 PM on 04/14/2023 ----------------------------------------- Reduction of fracture and OCL splint applied by  Dr. Cyril Loosen and myself as described above. Patient accepted for admission and is currently in CT for repeat imaging.    FINAL CLINICAL IMPRESSION(S) / ED DIAGNOSES   Final diagnoses:  Closed trimalleolar fracture of right ankle, initial encounter     Rx / DC Orders   ED Discharge Orders     None        Note:  This document was prepared using Dragon voice recognition software and may include  unintentional dictation errors.   Chinita Pester, FNP 04/14/23 Madelin Headings, MD 04/14/23 [phone]

## 2023-04-14 NOTE — Assessment & Plan Note (Addendum)
 Secondary to mechanical ground-level fall.  - Podiatry consulted; appreciate their recommendations

## 2023-04-15 ENCOUNTER — Telehealth: Payer: Self-pay | Admitting: Internal Medicine

## 2023-04-15 ENCOUNTER — Telehealth: Payer: Self-pay | Admitting: Emergency Medicine

## 2023-04-15 MED ORDER — OXYCODONE HCL 5 MG PO TABS: 5.0000 mg | ORAL_TABLET | Freq: Four times a day (QID) | ORAL | 0 refills | Status: DC | PRN

## 2023-04-15 NOTE — Telephone Encounter (Signed)
 [facility] said they did not receive the patient's ED prescription for oxycodone.  I have resent the patient's prescription for her.

## 2023-04-15 NOTE — Telephone Encounter (Signed)
 Pt called and is scheduled to see [place] tomorrow but was seen in the ed yesterday and has a broken ankle. There was pain medication that was sent in but it shows that the electronic message failed. Pt is calling our office for pain medication and we have not seen her in office so cannot provide the medication. Can Dr Huel Cote re submit the pain medication to the pharmacy please. Pt has not been able to sleep from the pain.

## 2023-04-16 ENCOUNTER — Encounter: Payer: Self-pay | Admitting: Podiatry

## 2023-04-16 ENCOUNTER — Ambulatory Visit (INDEPENDENT_AMBULATORY_CARE_PROVIDER_SITE_OTHER): Admitting: Podiatry

## 2023-04-16 DIAGNOSIS — S82851A Displaced trimalleolar fracture of right lower leg, initial encounter for closed fracture: Secondary | ICD-10-CM | POA: Diagnosis not present

## 2023-04-16 MED ORDER — OXYCODONE-ACETAMINOPHEN 5-325 MG PO TABS: 1.0000 | ORAL_TABLET | ORAL | 0 refills | Status: DC | PRN

## 2023-04-16 NOTE — H&P (View-Only) (Signed)
 Chief Complaint  Patient presents with   Fracture    "I stepped out on the deck on Sunday afternoon and I slipped.  I broke my ankle." N - ankle pain L - left D - Sunday afternoon O - suddenly C - sharp, pain, tender, swelling A - walking, standing T - ER, xrays, CT, splint, Oxycondone that don't work    HPI: 63 y.o. female presenting today for outpatient follow-up of a trimalleolar ankle fracture sustained on Sunday, 04/14/2023.  She presented to the [facility] ED and diagnosed with trimalleolar displaced ankle fracture.  Posterior splinting was applied and patient seen today for outpatient follow-up and surgical consult.  Currently NWB using a knee scooter that she has at home  Past Medical History:  Diagnosis Date   Anxiety    Cancer (HCC)    bladder   GERD (gastroesophageal reflux disease)    History of kidney stones    Lower abdominal pain    Rheumatoid arthritis (HCC)    Urinary tract infection symptoms     Past Surgical History:  Procedure Laterality Date   ABDOMINAL HYSTERECTOMY     CESAREAN SECTION  1982   CHOLECYSTECTOMY     CYSTOSCOPY W/ RETROGRADES Bilateral 07/02/2019   Procedure: CYSTOSCOPY WITH RETROGRADE PYELOGRAM;  Surgeon: Vanna Scotland, MD;  Location: [facility];  Service: Urology;  Laterality: Bilateral;   CYSTOSCOPY W/ RETROGRADES Bilateral 12/21/2019   Procedure: CYSTOSCOPY WITH RETROGRADE PYELOGRAM;  Surgeon: Vanna Scotland, MD;  Location: [facility];  Service: Urology;  Laterality: Bilateral;   CYSTOSCOPY WITH BIOPSY N/A 12/22/2018   Procedure: CYSTOSCOPY WITH BIOPSY;  Surgeon: Vanna Scotland, MD;  Location: [facility];  Service: Urology;  Laterality: N/A;   CYSTOSCOPY WITH INJECTION N/A 04/08/2020   Procedure: CYSTOSCOPY WITH INJECTION OF INDOCYANINE GREEN DYE;  Surgeon: Sebastian Ache, MD;  Location: [facility];  Service: Urology;  Laterality: N/A;   IR NEPHROSTOMY PLACEMENT LEFT  10/06/2021   LYMPHADENECTOMY Bilateral 04/08/2020   Procedure:  LYMPHADENECTOMY;  Surgeon: Sebastian Ache, MD;  Location: [facility];  Service: Urology;  Laterality: Bilateral;   REVISION UROSTOMY CUTANEOUS Right    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   ROBOT ASSISTED LAPAROSCOPIC COMPLETE CYSTECT ILEAL CONDUIT N/A 04/08/2020   Procedure: XI ROBOTIC ASSISTED LAPAROSCOPIC COMPLETE CYSTECT , TOTAL ABDOMINAL HYSTERECTOMY, BILATERAL SALPINGO-OOPHORECTOMY AND ILEAL CONDUIT;  Surgeon: Sebastian Ache, MD;  Location: [facility];  Service: Urology;  Laterality: N/A;  6 HRS   TRANSURETHRAL RESECTION OF BLADDER TUMOR WITH MITOMYCIN-C N/A 12/22/2018   Procedure: TRANSURETHRAL RESECTION OF BLADDER TUMOR WITH Gemcitabine;  Surgeon: Vanna Scotland, MD;  Location: [facility];  Service: Urology;  Laterality: N/A;   TRANSURETHRAL RESECTION OF BLADDER TUMOR WITH MITOMYCIN-C N/A 07/02/2019   Procedure: TRANSURETHRAL RESECTION OF BLADDER TUMOR WITH Gemcitabine;  Surgeon: Vanna Scotland, MD;  Location: [facility];  Service: Urology;  Laterality: N/A;   TRANSURETHRAL RESECTION OF BLADDER TUMOR WITH MITOMYCIN-C N/A 12/21/2019   Procedure: TRANSURETHRAL RESECTION OF BLADDER TUMOR WITH Gemcitabine;  Surgeon: Vanna Scotland, MD;  Location: [facility];  Service: Urology;  Laterality: N/A;   TRANSURETHRAL RESECTION OF BLADDER TUMOR WITH MITOMYCIN-C N/A 02/01/2020   Procedure: TRANSURETHRAL RESECTION OF BLADDER TUMOR WITH Gemcitabine;  Surgeon: Vanna Scotland, MD;  Location: [facility];  Service: Urology;  Laterality: N/A;    No Known Allergies   Physical Exam: General: The patient is alert and oriented x3 in no acute distress.  Dermatology: Skin is warm, dry and supple bilateral lower extremities.  No fracture blister or  open lacerations to the skin.  Vascular: Palpable pedal pulses bilaterally. Capillary refill within normal limits.  Moderate edema noted throughout the foot.    Neurological: Grossly intact via light touch  Musculoskeletal Exam: Left foot is in gross alignment of the leg.  DG  Ankle Complete Right 04/14/2023 FINDINGS: There is oblique mildly displaced fracture of the posterior malleolus. There is mildly displaced fracture of the medial malleolus and moderately displaced oblique fracture of the distal right fibula. IMPRESSION: *Trimalleolar fracture, as described above.  CT Ankle Right Wo Contrast 04/14/2023 FINDINGS: Bones/Joint/Cartilage   Acute trimalleolar fracture of the right ankle. Obliquely oriented, comminuted fracture through the lateral malleolus and distal fibular metaphysis with 8 mm of posterior displacement as well as mild shortening. Vertically oriented posterior malleolar fracture extending to the central aspect of the tibial plafond with approximately 7 mm of articular-surface diastasis centrally and mild articular surface depression. Transversely oriented fracture through the base of the medial malleolus with mild fracture distraction. Posterior subluxation at the tibiotalar joint. No dislocation. There are several small fracture fragments in the tibiotalar joint space.   Remaining bones of the hindfoot and midfoot appear intact without fracture or dislocation. Mild osteoarthritic changes.  IMPRESSION: Acute trimalleolar fracture-subluxation of the right ankle, as described above.   Assessment/Plan of Care: 1.  Closed displaced trimalleolar ankle fracture right  -Patient evaluated.  Imaging reviewed -Posterior splint was removed today to evaluate the ankle.  Ace wrap and immobilization cam boot was reapplied.  Continue strict NWB -Recommend surgery for the patient.  Today we discussed surgery in detail including risk benefits advantages and disadvantages.  Postoperative recovery course was also explained in detail to the patient.  She understands that she will be nonweightbearing minimum 8 weeks postoperatively.  No guarantees were expressed or implied. -Authorization for surgery was initiated today.  Surgery will consist of open  reduction with internal fixation right ankle fracture -Tentative plan for surgery at [facility] this upcoming Thursday or Friday -Refill prescription for Percocet 5/325 mg -Return to clinic 1 week postop       Felecia Shelling, DPM [facility]  Dr. Felecia Shelling, DPM    [address] [place], [place] [address]                Office [phone]  Fax [phone]

## 2023-04-16 NOTE — Progress Notes (Signed)
 Chief Complaint  Patient presents with   Fracture    "I stepped out on the deck on Sunday afternoon and I slipped.  I broke my ankle." N - ankle pain L - left D - Sunday afternoon O - suddenly C - sharp, pain, tender, swelling A - walking, standing T - ER, xrays, CT, splint, Oxycondone that don't work    HPI: 63 y.o. female presenting today for outpatient follow-up of a trimalleolar ankle fracture sustained on Sunday, 04/14/2023.  She presented to the [facility] ED and diagnosed with trimalleolar displaced ankle fracture.  Posterior splinting was applied and patient seen today for outpatient follow-up and surgical consult.  Currently NWB using a knee scooter that she has at home  Past Medical History:  Diagnosis Date   Anxiety    Cancer (HCC)    bladder   GERD (gastroesophageal reflux disease)    History of kidney stones    Lower abdominal pain    Rheumatoid arthritis (HCC)    Urinary tract infection symptoms     Past Surgical History:  Procedure Laterality Date   ABDOMINAL HYSTERECTOMY     CESAREAN SECTION  1982   CHOLECYSTECTOMY     CYSTOSCOPY W/ RETROGRADES Bilateral 07/02/2019   Procedure: CYSTOSCOPY WITH RETROGRADE PYELOGRAM;  Surgeon: Vanna Scotland, MD;  Location: [facility];  Service: Urology;  Laterality: Bilateral;   CYSTOSCOPY W/ RETROGRADES Bilateral 12/21/2019   Procedure: CYSTOSCOPY WITH RETROGRADE PYELOGRAM;  Surgeon: Vanna Scotland, MD;  Location: [facility];  Service: Urology;  Laterality: Bilateral;   CYSTOSCOPY WITH BIOPSY N/A 12/22/2018   Procedure: CYSTOSCOPY WITH BIOPSY;  Surgeon: Vanna Scotland, MD;  Location: [facility];  Service: Urology;  Laterality: N/A;   CYSTOSCOPY WITH INJECTION N/A 04/08/2020   Procedure: CYSTOSCOPY WITH INJECTION OF INDOCYANINE GREEN DYE;  Surgeon: Sebastian Ache, MD;  Location: [facility];  Service: Urology;  Laterality: N/A;   IR NEPHROSTOMY PLACEMENT LEFT  10/06/2021   LYMPHADENECTOMY Bilateral 04/08/2020   Procedure:  LYMPHADENECTOMY;  Surgeon: Sebastian Ache, MD;  Location: [facility];  Service: Urology;  Laterality: Bilateral;   REVISION UROSTOMY CUTANEOUS Right    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   ROBOT ASSISTED LAPAROSCOPIC COMPLETE CYSTECT ILEAL CONDUIT N/A 04/08/2020   Procedure: XI ROBOTIC ASSISTED LAPAROSCOPIC COMPLETE CYSTECT , TOTAL ABDOMINAL HYSTERECTOMY, BILATERAL SALPINGO-OOPHORECTOMY AND ILEAL CONDUIT;  Surgeon: Sebastian Ache, MD;  Location: [facility];  Service: Urology;  Laterality: N/A;  6 HRS   TRANSURETHRAL RESECTION OF BLADDER TUMOR WITH MITOMYCIN-C N/A 12/22/2018   Procedure: TRANSURETHRAL RESECTION OF BLADDER TUMOR WITH Gemcitabine;  Surgeon: Vanna Scotland, MD;  Location: [facility];  Service: Urology;  Laterality: N/A;   TRANSURETHRAL RESECTION OF BLADDER TUMOR WITH MITOMYCIN-C N/A 07/02/2019   Procedure: TRANSURETHRAL RESECTION OF BLADDER TUMOR WITH Gemcitabine;  Surgeon: Vanna Scotland, MD;  Location: [facility];  Service: Urology;  Laterality: N/A;   TRANSURETHRAL RESECTION OF BLADDER TUMOR WITH MITOMYCIN-C N/A 12/21/2019   Procedure: TRANSURETHRAL RESECTION OF BLADDER TUMOR WITH Gemcitabine;  Surgeon: Vanna Scotland, MD;  Location: [facility];  Service: Urology;  Laterality: N/A;   TRANSURETHRAL RESECTION OF BLADDER TUMOR WITH MITOMYCIN-C N/A 02/01/2020   Procedure: TRANSURETHRAL RESECTION OF BLADDER TUMOR WITH Gemcitabine;  Surgeon: Vanna Scotland, MD;  Location: [facility];  Service: Urology;  Laterality: N/A;    No Known Allergies   Physical Exam: General: The patient is alert and oriented x3 in no acute distress.  Dermatology: Skin is warm, dry and supple bilateral lower extremities.  No fracture blister or  open lacerations to the skin.  Vascular: Palpable pedal pulses bilaterally. Capillary refill within normal limits.  Moderate edema noted throughout the foot.    Neurological: Grossly intact via light touch  Musculoskeletal Exam: Left foot is in gross alignment of the leg.  DG  Ankle Complete Right 04/14/2023 FINDINGS: There is oblique mildly displaced fracture of the posterior malleolus. There is mildly displaced fracture of the medial malleolus and moderately displaced oblique fracture of the distal right fibula. IMPRESSION: *Trimalleolar fracture, as described above.  CT Ankle Right Wo Contrast 04/14/2023 FINDINGS: Bones/Joint/Cartilage   Acute trimalleolar fracture of the right ankle. Obliquely oriented, comminuted fracture through the lateral malleolus and distal fibular metaphysis with 8 mm of posterior displacement as well as mild shortening. Vertically oriented posterior malleolar fracture extending to the central aspect of the tibial plafond with approximately 7 mm of articular-surface diastasis centrally and mild articular surface depression. Transversely oriented fracture through the base of the medial malleolus with mild fracture distraction. Posterior subluxation at the tibiotalar joint. No dislocation. There are several small fracture fragments in the tibiotalar joint space.   Remaining bones of the hindfoot and midfoot appear intact without fracture or dislocation. Mild osteoarthritic changes.  IMPRESSION: Acute trimalleolar fracture-subluxation of the right ankle, as described above.   Assessment/Plan of Care: 1.  Closed displaced trimalleolar ankle fracture right  -Patient evaluated.  Imaging reviewed -Posterior splint was removed today to evaluate the ankle.  Ace wrap and immobilization cam boot was reapplied.  Continue strict NWB -Recommend surgery for the patient.  Today we discussed surgery in detail including risk benefits advantages and disadvantages.  Postoperative recovery course was also explained in detail to the patient.  She understands that she will be nonweightbearing minimum 8 weeks postoperatively.  No guarantees were expressed or implied. -Authorization for surgery was initiated today.  Surgery will consist of open  reduction with internal fixation right ankle fracture -Tentative plan for surgery at [facility] this upcoming Thursday or Friday -Refill prescription for Percocet 5/325 mg -Return to clinic 1 week postop       Felecia Shelling, DPM [facility]  Dr. Felecia Shelling, DPM    [address] [place], [place] [address]                Office [phone]  Fax [phone]

## 2023-04-17 ENCOUNTER — Telehealth: Payer: Self-pay | Admitting: Podiatry

## 2023-04-17 NOTE — Telephone Encounter (Signed)
 DOS: 04/19/23  ORIF TRIMALL ANKLE FX (R) [id number]  [employer]   EFFECTIVE DATE: 06/29/21 DEDUCTIBLE:  $8,300.00 WITH $0 REMAINING   OOP: $8,300.00 WITH $0  REMAINING:  CO INSURANCE 30%  PER JALISA B NO PRIOR AUTH REQ FOR CPT CODE: 11914  REF#[id number]

## 2023-04-18 ENCOUNTER — Telehealth: Payer: Self-pay | Admitting: Podiatry

## 2023-04-18 NOTE — Telephone Encounter (Signed)
 Patient is requesting order for pain medication be sent to pharmacy by tomorrow morning, will need to pick up medication before surgery. Contact telephone number [phone]

## 2023-04-19 ENCOUNTER — Ambulatory Visit
Admission: RE | Admit: 2023-04-19 | Discharge: 2023-04-19 | Disposition: A | Discharge status: Home / Self Care | Attending: Podiatry | Admitting: Podiatry

## 2023-04-19 ENCOUNTER — Other Ambulatory Visit: Payer: Self-pay

## 2023-04-19 ENCOUNTER — Ambulatory Visit: Admitting: Anesthesiology

## 2023-04-19 ENCOUNTER — Ambulatory Visit

## 2023-04-19 ENCOUNTER — Encounter: Payer: Self-pay | Admitting: Podiatry

## 2023-04-19 ENCOUNTER — Encounter
Admission: RE | Disposition: A | Discharge status: Home / Self Care | Payer: Self-pay | Source: Home / Self Care | Attending: Podiatry

## 2023-04-19 DIAGNOSIS — S82851A Displaced trimalleolar fracture of right lower leg, initial encounter for closed fracture: Secondary | ICD-10-CM | POA: Diagnosis present

## 2023-04-19 DIAGNOSIS — S82853A Displaced trimalleolar fracture of unspecified lower leg, initial encounter for closed fracture: Secondary | ICD-10-CM | POA: Diagnosis not present

## 2023-04-19 DIAGNOSIS — W010XXA Fall on same level from slipping, tripping and stumbling without subsequent striking against object, initial encounter: Secondary | ICD-10-CM | POA: Insufficient documentation

## 2023-04-19 DIAGNOSIS — Z419 Encounter for procedure for purposes other than remedying health state, unspecified: Secondary | ICD-10-CM

## 2023-04-19 HISTORY — PX: ORIF ANKLE FRACTURE: SHX5408

## 2023-04-19 SURGERY — OPEN REDUCTION INTERNAL FIXATION (ORIF) ANKLE FRACTURE
Anesthesia: General | Site: Ankle | Laterality: Right

## 2023-04-19 MED ORDER — OXYCODONE HCL 5 MG PO TABS
ORAL_TABLET | ORAL | Status: AC
  Filled 2023-04-19: qty 1

## 2023-04-19 MED ORDER — ONDANSETRON HCL 4 MG/2ML IJ SOLN
INTRAMUSCULAR | Status: DC | PRN
  Administered 2023-04-19: 4 mg via INTRAVENOUS

## 2023-04-19 MED ORDER — LIDOCAINE HCL (CARDIAC) PF 100 MG/5ML IV SOSY
PREFILLED_SYRINGE | INTRAVENOUS | Status: DC | PRN
  Administered 2023-04-19: 60 mg via INTRAVENOUS

## 2023-04-19 MED ORDER — OXYCODONE HCL 5 MG/5ML PO SOLN: 5.0000 mg | Freq: Once | ORAL | Status: DC | PRN

## 2023-04-19 MED ORDER — SUCCINYLCHOLINE CHLORIDE 200 MG/10ML IV SOSY
PREFILLED_SYRINGE | INTRAVENOUS | Status: DC | PRN
  Administered 2023-04-19: 20 mg via INTRAVENOUS

## 2023-04-19 MED ORDER — KETAMINE HCL 10 MG/ML IJ SOLN
INTRAMUSCULAR | Status: DC | PRN
  Administered 2023-04-19 (×2): 20 mg via INTRAVENOUS
  Administered 2023-04-19: 10 mg via INTRAVENOUS

## 2023-04-19 MED ORDER — PROPOFOL 10 MG/ML IV BOLUS
INTRAVENOUS | Status: AC
  Filled 2023-04-19: qty 20

## 2023-04-19 MED ORDER — MIDAZOLAM HCL 2 MG/2ML IJ SOLN
INTRAMUSCULAR | Status: DC | PRN
  Administered 2023-04-19: 2 mg via INTRAVENOUS

## 2023-04-19 MED ORDER — CEFAZOLIN SODIUM-DEXTROSE 2-4 GM/100ML-% IV SOLN
2.0000 g | INTRAVENOUS | Status: AC
  Administered 2023-04-19: 2 g via INTRAVENOUS

## 2023-04-19 MED ORDER — CHLORHEXIDINE GLUCONATE 0.12 % MT SOLN
15.0000 mL | Freq: Once | OROMUCOSAL | Status: AC
  Administered 2023-04-19: 15 mL via OROMUCOSAL

## 2023-04-19 MED ORDER — ACETAMINOPHEN 10 MG/ML IV SOLN
INTRAVENOUS | Status: AC
  Filled 2023-04-19: qty 100

## 2023-04-19 MED ORDER — BUPIVACAINE HCL (PF) 0.5 % IJ SOLN
INTRAMUSCULAR | Status: DC | PRN
  Administered 2023-04-19: 20 mL

## 2023-04-19 MED ORDER — FENTANYL CITRATE (PF) 100 MCG/2ML IJ SOLN
INTRAMUSCULAR | Status: AC
Start: 2023-04-19 — End: ?
  Filled 2023-04-19: qty 2

## 2023-04-19 MED ORDER — CEFAZOLIN SODIUM-DEXTROSE 2-4 GM/100ML-% IV SOLN: 2.0000 g | INTRAVENOUS | Status: DC

## 2023-04-19 MED ORDER — MIDAZOLAM HCL 2 MG/2ML IJ SOLN
INTRAMUSCULAR | Status: AC
  Filled 2023-04-19: qty 2

## 2023-04-19 MED ORDER — FENTANYL CITRATE (PF) 100 MCG/2ML IJ SOLN
INTRAMUSCULAR | Status: DC | PRN
  Administered 2023-04-19 (×4): 50 ug via INTRAVENOUS

## 2023-04-19 MED ORDER — LACTATED RINGERS IV SOLN: INTRAVENOUS | Status: DC

## 2023-04-19 MED ORDER — METOPROLOL TARTRATE 5 MG/5ML IV SOLN
INTRAVENOUS | Status: DC | PRN
  Administered 2023-04-19: 2 mg via INTRAVENOUS

## 2023-04-19 MED ORDER — KETAMINE HCL 50 MG/5ML IJ SOSY
PREFILLED_SYRINGE | INTRAMUSCULAR | Status: AC
  Filled 2023-04-19: qty 5

## 2023-04-19 MED ORDER — DEXMEDETOMIDINE HCL IN NACL 80 MCG/20ML IV SOLN
INTRAVENOUS | Status: DC | PRN
  Administered 2023-04-19: 8 ug via INTRAVENOUS
  Administered 2023-04-19: 12 ug via INTRAVENOUS

## 2023-04-19 MED ORDER — ACETAMINOPHEN 10 MG/ML IV SOLN
INTRAVENOUS | Status: DC | PRN
  Administered 2023-04-19: 1000 mg via INTRAVENOUS

## 2023-04-19 MED ORDER — DEXAMETHASONE SODIUM PHOSPHATE 10 MG/ML IJ SOLN
INTRAMUSCULAR | Status: DC | PRN
  Administered 2023-04-19: 8 mg via INTRAVENOUS

## 2023-04-19 MED ORDER — CHLORHEXIDINE GLUCONATE 0.12 % MT SOLN
OROMUCOSAL | Status: AC
  Filled 2023-04-19: qty 15

## 2023-04-19 MED ORDER — CEFAZOLIN SODIUM-DEXTROSE 2-4 GM/100ML-% IV SOLN
INTRAVENOUS | Status: AC
  Filled 2023-04-19: qty 100

## 2023-04-19 MED ORDER — BUPIVACAINE HCL (PF) 0.5 % IJ SOLN
INTRAMUSCULAR | Status: AC
  Filled 2023-04-19: qty 30

## 2023-04-19 MED ORDER — FENTANYL CITRATE (PF) 100 MCG/2ML IJ SOLN
INTRAMUSCULAR | Status: AC
  Filled 2023-04-19: qty 2

## 2023-04-19 MED ORDER — 0.9 % SODIUM CHLORIDE (POUR BTL) OPTIME
TOPICAL | Status: DC | PRN
  Administered 2023-04-19: 500 mL

## 2023-04-19 MED ORDER — OXYCODONE HCL 5 MG PO TABS: 5.0000 mg | ORAL_TABLET | Freq: Once | ORAL | Status: DC | PRN

## 2023-04-19 MED ORDER — ORAL CARE MOUTH RINSE: 15.0000 mL | Freq: Once | OROMUCOSAL | Status: AC

## 2023-04-19 MED ORDER — OXYCODONE HCL 5 MG PO TABS
ORAL_TABLET | ORAL | Status: AC
Start: 2023-04-19 — End: ?
  Filled 2023-04-19: qty 1

## 2023-04-19 MED ORDER — OXYCODONE HCL 5 MG PO TABS
10.0000 mg | ORAL_TABLET | Freq: Once | ORAL | Status: AC
  Administered 2023-04-19: 10 mg via ORAL

## 2023-04-19 MED ORDER — FENTANYL CITRATE (PF) 100 MCG/2ML IJ SOLN: 25.0000 ug | INTRAMUSCULAR | Status: DC | PRN

## 2023-04-19 MED ORDER — PROPOFOL 10 MG/ML IV BOLUS
INTRAVENOUS | Status: DC | PRN
  Administered 2023-04-19: 100 mg via INTRAVENOUS
  Administered 2023-04-19: 150 mg via INTRAVENOUS
  Administered 2023-04-19: 50 mg via INTRAVENOUS

## 2023-04-19 SURGICAL SUPPLY — 53 items
BIT DRILL 2.0X130 SLD AO (BIT) IMPLANT
BIT DRILL CANNULTD 2.6 X 130MM (DRILL) IMPLANT
BNDG COHESIVE 4X5 TAN STRL LF (GAUZE/BANDAGES/DRESSINGS) ×1 IMPLANT
BNDG ELASTIC 4X5.8 VLCR NS LF (GAUZE/BANDAGES/DRESSINGS) ×1 IMPLANT
BNDG ESMARCH 4X12 STRL LF (GAUZE/BANDAGES/DRESSINGS) ×1 IMPLANT
BNDG GAUZE DERMACEA FLUFF 4 (GAUZE/BANDAGES/DRESSINGS) ×1 IMPLANT
CHLORAPREP W/TINT 26 (MISCELLANEOUS) ×1 IMPLANT
COUNTERSICK 4.0 HEADED (MISCELLANEOUS) ×1 IMPLANT
CUFF TOURN SGL QUICK 18X4 (TOURNIQUET CUFF) IMPLANT
CUFF TRNQT CYL 24X4X16.5-23 (TOURNIQUET CUFF) IMPLANT
DRAPE C-ARM 42X70 (DRAPES) IMPLANT
DRAPE C-ARMOR (DRAPES) IMPLANT
DRAPE FLUOR MINI C-ARM 54X84 (DRAPES) IMPLANT
DRILL CANNULATED 2.6 X 130MM (DRILL) ×1 IMPLANT
ELECT REM PT RETURN 9FT ADLT (ELECTROSURGICAL) ×1 IMPLANT
ELECTRODE REM PT RTRN 9FT ADLT (ELECTROSURGICAL) ×1 IMPLANT
GAUZE SPONGE 4X4 12PLY STRL (GAUZE/BANDAGES/DRESSINGS) ×1 IMPLANT
GAUZE XEROFORM 1X8 LF (GAUZE/BANDAGES/DRESSINGS) ×1 IMPLANT
GLOVE BIO SURGEON STRL SZ8 (GLOVE) ×1 IMPLANT
GLOVE BIOGEL PI IND STRL 8 (GLOVE) ×1 IMPLANT
GOWN STRL REUS W/ TWL LRG LVL3 (GOWN DISPOSABLE) ×1 IMPLANT
GOWN STRL REUS W/ TWL XL LVL3 (GOWN DISPOSABLE) ×1 IMPLANT
HANDLE YANKAUER SUCT BULB TIP (MISCELLANEOUS) ×1 IMPLANT
KIT TURNOVER KIT A (KITS) ×1 IMPLANT
LABEL OR SOLS (LABEL) ×1 IMPLANT
MANIFOLD NEPTUNE II (INSTRUMENTS) ×1 IMPLANT
NDL HYPO 25X1 1.5 SAFETY (NEEDLE) ×2 IMPLANT
NEEDLE HYPO 25X1 1.5 SAFETY (NEEDLE) ×2 IMPLANT
NS IRRIG 500ML POUR BTL (IV SOLUTION) ×1 IMPLANT
PACK EXTREMITY ARMC (MISCELLANEOUS) ×1 IMPLANT
PAD ABD DERMACEA PRESS 5X9 (GAUZE/BANDAGES/DRESSINGS) ×1 IMPLANT
PAD PREP OB/GYN DISP 24X41 (PERSONAL CARE ITEMS) ×1 IMPLANT
PLATE FIBULA 9HOLE ANATOMICAL (Plate) IMPLANT
SCREW CANN HD LNG 4.0X32 (Screw) IMPLANT
SCREW CANN HEAD LT 4.0X46 (Screw) IMPLANT
SCREW COUNTERSINK 4.0 HEADED (MISCELLANEOUS) IMPLANT
SCREW LOCK PLATE R3 2.7X12 (Screw) IMPLANT
SCREW LOCK PLATE R3 2.7X13 (Screw) IMPLANT
SCREW LOCK PLATE R3 2.7X14 (Screw) IMPLANT
SOL PREP PVP 2OZ (MISCELLANEOUS) ×1 IMPLANT
SOLUTION PREP PVP 2OZ (MISCELLANEOUS) ×1 IMPLANT
SPONGE T-LAP 18X18 ~~LOC~~+RFID (SPONGE) ×1 IMPLANT
STAPLER SKIN PROX 35W (STAPLE) IMPLANT
STOCKINETTE M/LG 89821 (MISCELLANEOUS) ×1 IMPLANT
STRAP SAFETY 5IN WIDE (MISCELLANEOUS) ×1 IMPLANT
SUT VIC AB 4-0 PS2 27 (SUTURE) ×1 IMPLANT
SUT VICRYL 4-0 27 PS-2 BARIAT (SUTURE) ×1 IMPLANT
SUT VICRYL AB 3-0 FS1 BRD 27IN (SUTURE) ×1 IMPLANT
SUTURE VICRYL 4-0 27 PS-2 BART (SUTURE) ×1 IMPLANT
SYR 10ML LL (SYRINGE) ×2 IMPLANT
TRAP FLUID SMOKE EVACUATOR (MISCELLANEOUS) ×1 IMPLANT
WATER STERILE IRR 500ML POUR (IV SOLUTION) ×1 IMPLANT
WIRE OLIVE SMOOTH 1.4MMX60MM (WIRE) IMPLANT

## 2023-04-19 NOTE — Anesthesia Preprocedure Evaluation (Signed)
 Anesthesia Evaluation  Patient identified by MRN, date of birth, ID band Patient awake    Reviewed: Allergy & Precautions, NPO status , Patient's Chart, lab work & pertinent test results  History of Anesthesia Complications Negative for: history of anesthetic complications  Airway Mallampati: I  TM Distance: >3 FB Neck ROM: Full    Dental  (+) Edentulous Upper, Edentulous Lower, Dental Advisory Given   Pulmonary neg pulmonary ROS, former smoker   Pulmonary exam normal        Cardiovascular negative cardio ROS Normal cardiovascular exam     Neuro/Psych  PSYCHIATRIC DISORDERS Anxiety Depression    negative neurological ROS  negative psych ROS   GI/Hepatic negative GI ROS, Neg liver ROS,GERD  Medicated and Controlled,,  Endo/Other  negative endocrine ROS    Renal/GU Renal disease  negative genitourinary   Musculoskeletal   Abdominal   Peds  Hematology negative hematology ROS (+)   Anesthesia Other Findings Past Medical History: No date: Anxiety No date: Cancer [facility])     Comment:  bladder No date: GERD (gastroesophageal reflux disease) No date: History of kidney stones No date: Lower abdominal pain No date: Rheumatoid arthritis (HCC) No date: Urinary tract infection symptoms  Past Surgical History: No date: ABDOMINAL HYSTERECTOMY 1982: CESAREAN SECTION No date: CHOLECYSTECTOMY 07/02/2019: CYSTOSCOPY W/ RETROGRADES; Bilateral     Comment:  Procedure: CYSTOSCOPY WITH RETROGRADE PYELOGRAM;                Surgeon: Vanna Scotland, MD;  Location: [facility];                Service: Urology;  Laterality: Bilateral; 12/21/2019: CYSTOSCOPY W/ RETROGRADES; Bilateral     Comment:  Procedure: CYSTOSCOPY WITH RETROGRADE PYELOGRAM;                Surgeon: Vanna Scotland, MD;  Location: [facility];                Service: Urology;  Laterality: Bilateral; 12/22/2018: CYSTOSCOPY WITH BIOPSY; N/A     Comment:  Procedure:  CYSTOSCOPY WITH BIOPSY;  Surgeon: Vanna Scotland, MD;  Location: [facility];  Service: Urology;                Laterality: N/A; 04/08/2020: CYSTOSCOPY WITH INJECTION; N/A     Comment:  Procedure: CYSTOSCOPY WITH INJECTION OF INDOCYANINE               GREEN DYE;  Surgeon: Sebastian Ache, MD;  Location: [facility];  Service: Urology;  Laterality: N/A; 10/06/2021: IR NEPHROSTOMY PLACEMENT LEFT 04/08/2020: LYMPHADENECTOMY; Bilateral     Comment:  Procedure: LYMPHADENECTOMY;  Surgeon: Sebastian Ache,               MD;  Location: [facility];  Service: Urology;  Laterality:               Bilateral; No date: REVISION UROSTOMY CUTANEOUS; Right     Comment:  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 04/08/2020: ROBOT ASSISTED LAPAROSCOPIC COMPLETE CYSTECT ILEAL  CONDUIT; N/A     Comment:  Procedure: XI ROBOTIC ASSISTED LAPAROSCOPIC COMPLETE               CYSTECT , TOTAL ABDOMINAL HYSTERECTOMY, BILATERAL               SALPINGO-OOPHORECTOMY AND ILEAL CONDUIT;  Surgeon:  Sebastian Ache, MD;  Location: [facility];  Service: Urology;                Laterality: N/A;  6 HRS 12/22/2018: TRANSURETHRAL RESECTION OF BLADDER TUMOR WITH MITOMYCIN- C; N/A     Comment:  Procedure: TRANSURETHRAL RESECTION OF BLADDER TUMOR WITH              Gemcitabine;  Surgeon: Vanna Scotland, MD;  Location:               [facility];  Service: Urology;  Laterality: N/A; 07/02/2019: TRANSURETHRAL RESECTION OF BLADDER TUMOR WITH MITOMYCIN- C; N/A     Comment:  Procedure: TRANSURETHRAL RESECTION OF BLADDER TUMOR WITH              Gemcitabine;  Surgeon: Vanna Scotland, MD;  Location:               [facility];  Service: Urology;  Laterality: N/A; 12/21/2019: TRANSURETHRAL RESECTION OF BLADDER TUMOR WITH MITOMYCIN- C; N/A     Comment:  Procedure: TRANSURETHRAL RESECTION OF BLADDER TUMOR WITH              Gemcitabine;  Surgeon: Vanna Scotland, MD;  Location:               [facility];  Service: Urology;   Laterality: N/A; 02/01/2020: TRANSURETHRAL RESECTION OF BLADDER TUMOR WITH MITOMYCIN- C; N/A     Comment:  Procedure: TRANSURETHRAL RESECTION OF BLADDER TUMOR WITH              Gemcitabine;  Surgeon: Vanna Scotland, MD;  Location:               [facility];  Service: Urology;  Laterality: N/A;  BMI    Body Mass Index: 26.95 kg/m      Reproductive/Obstetrics negative OB ROS                             Anesthesia Physical Anesthesia Plan  ASA: 2  Anesthesia Plan: General   Post-op Pain Management:    Induction: Intravenous  PONV Risk Score and Plan: 3 and Ondansetron, Dexamethasone and Midazolam  Airway Management Planned: LMA  Additional Equipment:   Intra-op Plan:   Post-operative Plan:   Informed Consent: I have reviewed the patients History and Physical, chart, labs and discussed the procedure including the risks, benefits and alternatives for the proposed anesthesia with the patient or authorized representative who has indicated his/her understanding and acceptance.     Dental Advisory Given  Plan Discussed with: Anesthesiologist, CRNA and Surgeon  Anesthesia Plan Comments: (Patient consented for risks of anesthesia including but not limited to:  - adverse reactions to medications - risk of airway placement if required - damage to eyes, teeth, lips or other oral mucosa - nerve damage due to positioning  - sore throat or hoarseness - Damage to heart, brain, nerves, lungs, other parts of body or loss of life  Patient voiced understanding and assent.)        Anesthesia Quick Evaluation

## 2023-04-19 NOTE — Anesthesia Postprocedure Evaluation (Signed)
 Anesthesia Post Note  Patient: Sarah Rush  Procedure(s) Performed: OPEN REDUCTION INTERNAL FIXATION (ORIF) ANKLE FRACTURE (Right: Ankle)  Patient location during evaluation: PACU Anesthesia Type: General Level of consciousness: awake and alert Pain management: pain level controlled Vital Signs Assessment: post-procedure vital signs reviewed and stable Respiratory status: spontaneous breathing, nonlabored ventilation, respiratory function stable and patient connected to nasal cannula oxygen Cardiovascular status: blood pressure returned to baseline and stable Postop Assessment: no apparent nausea or vomiting Anesthetic complications: no   No notable events documented.   Last Vitals:  Vitals:   04/19/23 1631 04/19/23 1645  BP: (!) 145/68 138/85  Pulse: 78 75  Resp: 12 16  Temp: 37.2 C   SpO2: 99% 99%    Last Pain:  Vitals:   04/19/23 1311  TempSrc: Oral  PainSc: 5                  Cleda Mccreedy Birgit Nowling

## 2023-04-19 NOTE — Op Note (Signed)
 OPERATIVE REPORT Patient name: Sarah Rush MRN: [id number] DOB: 07/09/1960  DOS: 04/19/23  Preop Dx: Trimalleolar ankle fracture right Postop Dx: same  Procedure:  1.  Open reduction internal fixation trimalleolar ankle fracture right  Surgeon: Felecia Shelling DPM  Anesthesia: General anesthesia  Hemostasis: Thigh tourniquet inflated to a pressure of after esmarch exsanguination   EBL: 10 mL Materials: Paragon 28 precontoured fibular plate w/ locking screws.  Paragon 28 4.61mm cannulated partially-threaded screw x 2 Injectables: 20 mL of 0.5% Marcaine plain Pathology: None  Condition: The patient tolerated the procedure and anesthesia well. No complications noted or reported   Justification for procedure: The patient is a 63 y.o. female who presents today for surgical correction of her ankle fracture right lower extremity. The patient was told benefits as well as possible side effects of the surgery. The patient consented for surgical correction. The patient consent form was reviewed. All patient questions were answered. No guarantees were expressed or implied. The patient and the surgeon both signed the patient consent form with the witness present and placed in the patient's chart.   Procedure in Detail: The patient was brought to the operating room, placed in the operating table in the supine position at which time an aseptic scrub and drape were performed about the patient's respective lower extremity after anesthesia was induced as described above. Attention was then directed to the surgical area where procedure number one commenced.  Procedure #1: Open reduction internal fixation trimalleolar ankle fracture right A 10 cm linear longitudinal skin incision was planned and made bisecting the distal fibula.  Incision was carried down to the level of bone with care taken to cut clamp ligate retract away all small neurovascular structures traversing the incision site.   The fibular fracture was identified and irrigation around the area was performed to free any hemorrhagic clotting and hematoma within the fracture site.  The fracture was then reduced using bone reduction forceps and verified by intraoperative x-ray fluoroscopy which demonstrated restoration of the anatomical alignment of the fibula as well as restoration of the lateral gutter of the ankle mortise.  Permanent internal fixation was then achieved using a Paragon 28 precontoured fibular plate with respective locking screws.  Again, placement was verified with intraoperative x-ray fluoroscopy and all orthopedic hardware was inserted in the standard AO fashion.  Attention was then directed to the medial aspect of the ankle where a 4 cm curvilinear incision was planned and made overlying the medial malleolus.  Incision was carried down to the level of bone again with care taken to cut clamp ligate and retract away all small neurovascular structures traversing the incision site.  The saphenous vein was also identified and retracted and preserved.  The medial malleoli are fracture was identified and again copious irrigation was utilized to irrigate in the hemorrhagic hematoma within the fracture site.  The fracture was then reduced using temporary K wire fixation which was verified by x-ray fluoroscopy followed by permanent internal fixation using a Paragon 28 4.0 mm cortical cannulated screw.  Inserted in the standard AO fashion.  X-ray fluoroscopy demonstrated restoration of the tibiotalar joint incongruent ankle mortise.  Lateral view continues to demonstrate large posterior fragment and decision was made to reduce and internally fixate the posterior malleolar fracture.  Guidewire was inserted and an anterior to posterior approach approximately 2 cm proximal to the tibiotalar joint.  The K wire was guided with x-ray fluoroscopy to ensure adequate placement across the posterior fracture  fragment of the tibia.  After  appropriate placement across the posterior fragment, a small percutaneous incision was made to the posterior aspect of the ankle and permanent internal fixation was utilized with a cannulated orthopedic screw driven from a posterior to anterior approach to allow for greater purchase and reduction of the fracture fragment.  Good restoration and reduction of the posterior fragment was achieved.  The screw was inserted in the standard AO fashion.  Final x-rays demonstrated good anatomical alignment of the tibia and fibula and restoration of the tibiotalar joint.  Fractures reduced with orthopedic hardware in good placement.  Copious irrigation was then utilized in preparation for routine layered primary closure.  3-0 Vicryl suture was utilized to reapproximate deep subcutaneous tissue followed by 4-0 Vicryl suture to reapproximate subcuticular tissue and stainless steel skin staples to reapproximate superficial skin edges.  Dry sterile compressive dressings were then applied to all previously mentioned incision sites about the patient's lower extremity. The tourniquet which was used for hemostasis was deflated. All normal neurovascular responses including pink color and warmth returned all the digits of patient's lower extremity.  Prior to transfer to the recovery room the immobilization cam boot was reapplied to the lower extremity.  The patient was then transferred from the operating room to the recovery room having tolerated the procedure and anesthesia well. All vital signs are stable. After a brief stay in the recovery room the patient was readmitted to inpatient room with postoperative orders placed.    IMPRESSION: Good restoration of the tibiotalar joint and good reduction with anatomical realignment of the fracture sites.  Felecia Shelling, DPM [facility]  Dr. Felecia Shelling, DPM    [address] [place], [place] [address]                Office  [phone]  Fax [phone]

## 2023-04-19 NOTE — Progress Notes (Signed)
 Patient with dressing to right ankle area:  xeroform, 4x4 abd , kerlix , ace ace and orthro boot.  Dressing c/d/I, +CMS. Reviewed discharge instructions with patient and family, all verbalize understanding.

## 2023-04-19 NOTE — Brief Op Note (Signed)
 04/19/2023  4:32 PM  PATIENT:  Sarah Rush  63 y.o. female  PRE-OPERATIVE DIAGNOSIS:  closed trimalleoloar fracture of right ankle  POST-OPERATIVE DIAGNOSIS:  closed trimalleoloar fracture of right ankle  PROCEDURE:  Procedure(s): OPEN REDUCTION INTERNAL FIXATION (ORIF) ANKLE FRACTURE (Right)  SURGEON:  Surgeons and Role:    Felecia Shelling, DPM - Primary  PHYSICIAN ASSISTANT:   ASSISTANTS: none   ANESTHESIA:   general  EBL:  10mL   BLOOD ADMINISTERED:none  DRAINS: none   LOCAL MEDICATIONS USED:  MARCAINE    and Amount: 20 ml  SPECIMEN:  No Specimen  DISPOSITION OF SPECIMEN:  N/A  COUNTS:  YES  TOURNIQUET:   Total Tourniquet Time Documented: Thigh (Right) - 14 minutes Total: Thigh (Right) - 14 minutes   DICTATION: .Reubin Milan Dictation  PLAN OF CARE: Discharge to home after PACU  PATIENT DISPOSITION:  PACU - hemodynamically stable.   Delay start of Pharmacological VTE agent (>24hrs) due to surgical blood loss or risk of bleeding: no  Felecia Shelling, DPM [facility]  Dr. Felecia Shelling, DPM    [address] [place], [place] [address]                Office [phone]  Fax [phone]

## 2023-04-19 NOTE — Anesthesia Procedure Notes (Signed)
 Procedure Name: LMA Insertion Date/Time: 04/19/2023 2:56 PM  Performed by: Jaye Beagle, CRNAPre-anesthesia Checklist: Patient identified, Emergency Drugs available, Suction available and Patient being monitored Patient Re-evaluated:Patient Re-evaluated prior to induction Oxygen Delivery Method: Circle system utilized Preoxygenation: Pre-oxygenation with 100% oxygen Induction Type: IV induction Ventilation: Mask ventilation without difficulty LMA: LMA inserted LMA Size: 3.0 Number of attempts: 1 Placement Confirmation: positive ETCO2 and breath sounds checked- equal and bilateral Tube secured with: Tape Dental Injury: Teeth and Oropharynx as per pre-operative assessment  Comments: Unable to open jaws for LMA insertion--20mg  succinylcholine given for relaxation.  Atraumatic insertion x1.

## 2023-04-19 NOTE — Transfer of Care (Signed)
 Immediate Anesthesia Transfer of Care Note  Patient: Sarah Rush  Procedure(s) Performed: OPEN REDUCTION INTERNAL FIXATION (ORIF) ANKLE FRACTURE (Right: Ankle)  Patient Location: PACU  Anesthesia Type:General  Level of Consciousness: drowsy  Airway & Oxygen Therapy: Patient Spontanous Breathing and Patient connected to face mask oxygen  Post-op Assessment: Report given to RN  Post vital signs: stable  Last Vitals:  Vitals Value Taken Time  BP 145/68 04/19/23 1631  Temp    Pulse 85 04/19/23 1633  Resp 45 04/19/23 1633  SpO2 99 % 04/19/23 1633  Vitals shown include unfiled device data.  Last Pain:  Vitals:   04/19/23 1311  TempSrc: Oral  PainSc: 5          Complications: No notable events documented.

## 2023-04-19 NOTE — Interval H&P Note (Signed)
 History and Physical Interval Note:  04/19/2023 2:02 PM  Sarah Rush  has presented today for surgery, with the diagnosis of closed trimalleoloar fracture of right ankle.  The various methods of treatment have been discussed with the patient and family. After consideration of risks, benefits and other options for treatment, the patient has consented to  Procedure(s): OPEN REDUCTION INTERNAL FIXATION (ORIF) ANKLE FRACTURE (Right) as a surgical intervention.  The patient's history has been reviewed, patient examined, no change in status, stable for surgery.  I have reviewed the patient's chart and labs.  Questions were answered to the patient's satisfaction.     Felecia Shelling

## 2023-04-19 NOTE — Discharge Instructions (Signed)
 Leave dressings clean dry and intact with immobilization cam boot intact.  Strict nonweightbearing to the surgical extremity.  Elevate lower extremity. Ice behind knee 33min/hour for the first 24-48 hours.

## 2023-04-22 ENCOUNTER — Encounter: Payer: Self-pay | Admitting: Podiatry

## 2023-04-26 ENCOUNTER — Encounter: Payer: Self-pay | Admitting: Podiatry

## 2023-04-26 ENCOUNTER — Ambulatory Visit (INDEPENDENT_AMBULATORY_CARE_PROVIDER_SITE_OTHER)

## 2023-04-26 ENCOUNTER — Ambulatory Visit (INDEPENDENT_AMBULATORY_CARE_PROVIDER_SITE_OTHER): Admitting: Podiatry

## 2023-04-26 DIAGNOSIS — S82851A Displaced trimalleolar fracture of right lower leg, initial encounter for closed fracture: Secondary | ICD-10-CM

## 2023-04-30 NOTE — Progress Notes (Signed)
 Chief Complaint  Patient presents with   Routine Post Op    POV # 1 DOS 04/19/23 --- ORIF RIGHT ANKLE FRACTURE    Subjective:  Patient presents today status post ORIF right ankle.  DOS: 04/19/2023.  She has been NWB in the cam boot as instructed.  Dressings are clean and dry.  No new complaints  Past Medical History:  Diagnosis Date   Anxiety    Cancer (HCC)    bladder   GERD (gastroesophageal reflux disease)    History of kidney stones    Lower abdominal pain    Rheumatoid arthritis (HCC)    Urinary tract infection symptoms     Past Surgical History:  Procedure Laterality Date   ABDOMINAL HYSTERECTOMY     CESAREAN SECTION  1982   CHOLECYSTECTOMY     CYSTOSCOPY W/ RETROGRADES Bilateral 07/02/2019   Procedure: CYSTOSCOPY WITH RETROGRADE PYELOGRAM;  Surgeon: Vanna Scotland, MD;  Location: [facility];  Service: Urology;  Laterality: Bilateral;   CYSTOSCOPY W/ RETROGRADES Bilateral 12/21/2019   Procedure: CYSTOSCOPY WITH RETROGRADE PYELOGRAM;  Surgeon: Vanna Scotland, MD;  Location: [facility];  Service: Urology;  Laterality: Bilateral;   CYSTOSCOPY WITH BIOPSY N/A 12/22/2018   Procedure: CYSTOSCOPY WITH BIOPSY;  Surgeon: Vanna Scotland, MD;  Location: [facility];  Service: Urology;  Laterality: N/A;   CYSTOSCOPY WITH INJECTION N/A 04/08/2020   Procedure: CYSTOSCOPY WITH INJECTION OF INDOCYANINE GREEN DYE;  Surgeon: Sebastian Ache, MD;  Location: [facility];  Service: Urology;  Laterality: N/A;   IR NEPHROSTOMY PLACEMENT LEFT  10/06/2021   LYMPHADENECTOMY Bilateral 04/08/2020   Procedure: LYMPHADENECTOMY;  Surgeon: Sebastian Ache, MD;  Location: [facility];  Service: Urology;  Laterality: Bilateral;   ORIF ANKLE FRACTURE Right 04/19/2023   Procedure: OPEN REDUCTION INTERNAL FIXATION (ORIF) ANKLE FRACTURE;  Surgeon: Felecia Shelling, DPM;  Location: [facility];  Service: Orthopedics/Podiatry;  Laterality: Right;   REVISION UROSTOMY CUTANEOUS Right    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   ROBOT  ASSISTED LAPAROSCOPIC COMPLETE CYSTECT ILEAL CONDUIT N/A 04/08/2020   Procedure: XI ROBOTIC ASSISTED LAPAROSCOPIC COMPLETE CYSTECT , TOTAL ABDOMINAL HYSTERECTOMY, BILATERAL SALPINGO-OOPHORECTOMY AND ILEAL CONDUIT;  Surgeon: Sebastian Ache, MD;  Location: [facility];  Service: Urology;  Laterality: N/A;  6 HRS   TRANSURETHRAL RESECTION OF BLADDER TUMOR WITH MITOMYCIN-C N/A 12/22/2018   Procedure: TRANSURETHRAL RESECTION OF BLADDER TUMOR WITH Gemcitabine;  Surgeon: Vanna Scotland, MD;  Location: [facility];  Service: Urology;  Laterality: N/A;   TRANSURETHRAL RESECTION OF BLADDER TUMOR WITH MITOMYCIN-C N/A 07/02/2019   Procedure: TRANSURETHRAL RESECTION OF BLADDER TUMOR WITH Gemcitabine;  Surgeon: Vanna Scotland, MD;  Location: [facility];  Service: Urology;  Laterality: N/A;   TRANSURETHRAL RESECTION OF BLADDER TUMOR WITH MITOMYCIN-C N/A 12/21/2019   Procedure: TRANSURETHRAL RESECTION OF BLADDER TUMOR WITH Gemcitabine;  Surgeon: Vanna Scotland, MD;  Location: [facility];  Service: Urology;  Laterality: N/A;   TRANSURETHRAL RESECTION OF BLADDER TUMOR WITH MITOMYCIN-C N/A 02/01/2020   Procedure: TRANSURETHRAL RESECTION OF BLADDER TUMOR WITH Gemcitabine;  Surgeon: Vanna Scotland, MD;  Location: [facility];  Service: Urology;  Laterality: N/A;    No Known Allergies  Objective/Physical Exam Neurovascular status intact.  Incision well coapted with staples intact. No sign of infectious process noted. No dehiscence. No active bleeding noted.  Moderate edema noted to the surgical extremity.  Radiographic Exam RT ankle 04/26/2023:  Orthopedic hardware appears to be stable with routine healing.  Fracture fragments reduced and in good anatomical alignment.  Congruent tibiotalar joint.  Assessment: 1. s/p ORIF RT ankle. DOS: 04/19/2023   Plan of Care:  -Patient was evaluated. X-rays reviewed - Dressings changed.  Leave clean dry and intact x 1 week -Continue strict NWB in the cam boot -Continue Percocet 5/325  mg as needed pain -Return to clinic 1 week possible staple removal   Felecia Shelling, DPM [facility]  Dr. Felecia Shelling, DPM    [address] [place], [place] [address]                Office [phone]  Fax [phone]

## 2023-05-03 ENCOUNTER — Ambulatory Visit (INDEPENDENT_AMBULATORY_CARE_PROVIDER_SITE_OTHER): Admitting: Podiatry

## 2023-05-03 ENCOUNTER — Encounter: Payer: Self-pay | Admitting: Podiatry

## 2023-05-03 VITALS — Ht 64.0 in | Wt 157.0 lb

## 2023-05-03 DIAGNOSIS — S82851A Displaced trimalleolar fracture of right lower leg, initial encounter for closed fracture: Secondary | ICD-10-CM

## 2023-05-03 DIAGNOSIS — M79671 Pain in right foot: Secondary | ICD-10-CM

## 2023-05-14 ENCOUNTER — Other Ambulatory Visit (HOSPITAL_COMMUNITY): Payer: Self-pay | Admitting: Urology

## 2023-05-14 ENCOUNTER — Encounter (HOSPITAL_COMMUNITY): Payer: Self-pay

## 2023-05-14 ENCOUNTER — Ambulatory Visit (HOSPITAL_COMMUNITY)
Admission: RE | Admit: 2023-05-14 | Discharge: 2023-05-14 | Disposition: A | Discharge status: Home / Self Care | Source: Ambulatory Visit | Attending: Urology | Admitting: Urology

## 2023-05-14 DIAGNOSIS — C678 Malignant neoplasm of overlapping sites of bladder: Secondary | ICD-10-CM

## 2023-05-20 ENCOUNTER — Ambulatory Visit (HOSPITAL_COMMUNITY)
Admission: RE | Admit: 2023-05-20 | Discharge: 2023-05-20 | Disposition: A | Discharge status: Home / Self Care | Source: Ambulatory Visit | Attending: Urology | Admitting: Urology

## 2023-05-20 ENCOUNTER — Other Ambulatory Visit (HOSPITAL_COMMUNITY): Payer: Self-pay | Admitting: Urology

## 2023-05-20 DIAGNOSIS — C678 Malignant neoplasm of overlapping sites of bladder: Secondary | ICD-10-CM

## 2023-05-21 ENCOUNTER — Ambulatory Visit (INDEPENDENT_AMBULATORY_CARE_PROVIDER_SITE_OTHER)

## 2023-05-21 ENCOUNTER — Encounter: Payer: Self-pay | Admitting: Podiatry

## 2023-05-21 ENCOUNTER — Ambulatory Visit (INDEPENDENT_AMBULATORY_CARE_PROVIDER_SITE_OTHER): Admitting: Podiatry

## 2023-05-21 DIAGNOSIS — S82851A Displaced trimalleolar fracture of right lower leg, initial encounter for closed fracture: Secondary | ICD-10-CM | POA: Diagnosis not present

## 2023-05-21 NOTE — Progress Notes (Signed)
 Chief Complaint  Patient presents with   Routine Post Op    POV # 3 DOS 04/19/23 --- ORIF RIGHT ANKLE FRACTURE "I think it's doing pretty good."    Subjective:  Patient presents today status post ORIF right ankle.  DOS: 04/19/2023.  She has been NWB in the cam boot as instructed.  Dressings are clean and dry.  No new complaints  Past Medical History:  Diagnosis Date   Anxiety    Cancer (HCC)    bladder   GERD (gastroesophageal reflux disease)    History of kidney stones    Lower abdominal pain    Rheumatoid arthritis (HCC)    Urinary tract infection symptoms     Past Surgical History:  Procedure Laterality Date   ABDOMINAL HYSTERECTOMY     CESAREAN SECTION  1982   CHOLECYSTECTOMY     CYSTOSCOPY W/ RETROGRADES Bilateral 07/02/2019   Procedure: CYSTOSCOPY WITH RETROGRADE PYELOGRAM;  Surgeon: Dustin Gimenez, MD;  Location: [facility];  Service: Urology;  Laterality: Bilateral;   CYSTOSCOPY W/ RETROGRADES Bilateral 12/21/2019   Procedure: CYSTOSCOPY WITH RETROGRADE PYELOGRAM;  Surgeon: Dustin Gimenez, MD;  Location: [facility];  Service: Urology;  Laterality: Bilateral;   CYSTOSCOPY WITH BIOPSY N/A 12/22/2018   Procedure: CYSTOSCOPY WITH BIOPSY;  Surgeon: Dustin Gimenez, MD;  Location: [facility];  Service: Urology;  Laterality: N/A;   CYSTOSCOPY WITH INJECTION N/A 04/08/2020   Procedure: CYSTOSCOPY WITH INJECTION OF INDOCYANINE GREEN DYE;  Surgeon: Osborn Blaze, MD;  Location: [facility];  Service: Urology;  Laterality: N/A;   IR NEPHROSTOMY PLACEMENT LEFT  10/06/2021   LYMPHADENECTOMY Bilateral 04/08/2020   Procedure: LYMPHADENECTOMY;  Surgeon: Osborn Blaze, MD;  Location: [facility];  Service: Urology;  Laterality: Bilateral;   ORIF ANKLE FRACTURE Right 04/19/2023   Procedure: OPEN REDUCTION INTERNAL FIXATION (ORIF) ANKLE FRACTURE;  Surgeon: Dot Gazella, DPM;  Location: [facility];  Service: Orthopedics/Podiatry;  Laterality: Right;   REVISION UROSTOMY CUTANEOUS Right    has no  bladder. has R urostomy   ROBOT ASSISTED LAPAROSCOPIC COMPLETE CYSTECT ILEAL CONDUIT N/A 04/08/2020   Procedure: XI ROBOTIC ASSISTED LAPAROSCOPIC COMPLETE CYSTECT , TOTAL ABDOMINAL HYSTERECTOMY, BILATERAL SALPINGO-OOPHORECTOMY AND ILEAL CONDUIT;  Surgeon: Osborn Blaze, MD;  Location: [facility];  Service: Urology;  Laterality: N/A;  6 HRS   TRANSURETHRAL RESECTION OF BLADDER TUMOR WITH MITOMYCIN -C N/A 12/22/2018   Procedure: TRANSURETHRAL RESECTION OF BLADDER TUMOR WITH Gemcitabine ;  Surgeon: Dustin Gimenez, MD;  Location: [facility];  Service: Urology;  Laterality: N/A;   TRANSURETHRAL RESECTION OF BLADDER TUMOR WITH MITOMYCIN -C N/A 07/02/2019   Procedure: TRANSURETHRAL RESECTION OF BLADDER TUMOR WITH Gemcitabine ;  Surgeon: Dustin Gimenez, MD;  Location: [facility];  Service: Urology;  Laterality: N/A;   TRANSURETHRAL RESECTION OF BLADDER TUMOR WITH MITOMYCIN -C N/A 12/21/2019   Procedure: TRANSURETHRAL RESECTION OF BLADDER TUMOR WITH Gemcitabine ;  Surgeon: Dustin Gimenez, MD;  Location: [facility];  Service: Urology;  Laterality: N/A;   TRANSURETHRAL RESECTION OF BLADDER TUMOR WITH MITOMYCIN -C N/A 02/01/2020   Procedure: TRANSURETHRAL RESECTION OF BLADDER TUMOR WITH Gemcitabine ;  Surgeon: Dustin Gimenez, MD;  Location: [facility];  Service: Urology;  Laterality: N/A;    No Known Allergies  Objective/Physical Exam Neurovascular status intact.  All incisions are nicely healed.  Minimal edema noted.  Overall well-healing surgical foot and ankle  Radiographic Exam RT ankle 04/26/2023:  Orthopedic hardware appears to be stable with routine healing.  Fracture fragments reduced and in good anatomical alignment.  Congruent tibiotalar joint.  Assessment: 1. s/p ORIF  RT ankle. DOS: 04/19/2023   Plan of Care:  -Patient was evaluated.  - Continue NWB CAM boot for an additional 4 weeks with the assistance of the knee scooter -Return to clinic 4 weeks follow-up x-ray   Dot Gazella, DPM [facility]  Dr. Dot Gazella, DPM    [address] [place], [place] [address]                Office [phone]  Fax [phone]

## 2023-05-21 NOTE — Progress Notes (Signed)
 Chief Complaint  Patient presents with   Routine Post Op    POV # 2 DOS 04/19/23 --- ORIF RIGHT ANKLE FRACTURE Foot looks good less bruising, incisions healing well Possible staple removal, has gad some burning in dorsal toe area    Subjective:  Patient presents today status post ORIF right ankle.  DOS: 04/19/2023.  She has been NWB in the cam boot as instructed.  Dressings are clean and dry.  No new complaints  Past Medical History:  Diagnosis Date   Anxiety    Cancer (HCC)    bladder   GERD (gastroesophageal reflux disease)    History of kidney stones    Lower abdominal pain    Rheumatoid arthritis (HCC)    Urinary tract infection symptoms     Past Surgical History:  Procedure Laterality Date   ABDOMINAL HYSTERECTOMY     CESAREAN SECTION  1982   CHOLECYSTECTOMY     CYSTOSCOPY W/ RETROGRADES Bilateral 07/02/2019   Procedure: CYSTOSCOPY WITH RETROGRADE PYELOGRAM;  Surgeon: Dustin Gimenez, MD;  Location: [facility];  Service: Urology;  Laterality: Bilateral;   CYSTOSCOPY W/ RETROGRADES Bilateral 12/21/2019   Procedure: CYSTOSCOPY WITH RETROGRADE PYELOGRAM;  Surgeon: Dustin Gimenez, MD;  Location: [facility];  Service: Urology;  Laterality: Bilateral;   CYSTOSCOPY WITH BIOPSY N/A 12/22/2018   Procedure: CYSTOSCOPY WITH BIOPSY;  Surgeon: Dustin Gimenez, MD;  Location: [facility];  Service: Urology;  Laterality: N/A;   CYSTOSCOPY WITH INJECTION N/A 04/08/2020   Procedure: CYSTOSCOPY WITH INJECTION OF INDOCYANINE GREEN DYE;  Surgeon: Osborn Blaze, MD;  Location: [facility];  Service: Urology;  Laterality: N/A;   IR NEPHROSTOMY PLACEMENT LEFT  10/06/2021   LYMPHADENECTOMY Bilateral 04/08/2020   Procedure: LYMPHADENECTOMY;  Surgeon: Osborn Blaze, MD;  Location: [facility];  Service: Urology;  Laterality: Bilateral;   ORIF ANKLE FRACTURE Right 04/19/2023   Procedure: OPEN REDUCTION INTERNAL FIXATION (ORIF) ANKLE FRACTURE;  Surgeon: Dot Gazella, DPM;  Location: [facility];  Service:  Orthopedics/Podiatry;  Laterality: Right;   REVISION UROSTOMY CUTANEOUS Right    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   ROBOT ASSISTED LAPAROSCOPIC COMPLETE CYSTECT ILEAL CONDUIT N/A 04/08/2020   Procedure: XI ROBOTIC ASSISTED LAPAROSCOPIC COMPLETE CYSTECT , TOTAL ABDOMINAL HYSTERECTOMY, BILATERAL SALPINGO-OOPHORECTOMY AND ILEAL CONDUIT;  Surgeon: Osborn Blaze, MD;  Location: [facility];  Service: Urology;  Laterality: N/A;  6 HRS   TRANSURETHRAL RESECTION OF BLADDER TUMOR WITH MITOMYCIN -C N/A 12/22/2018   Procedure: TRANSURETHRAL RESECTION OF BLADDER TUMOR WITH Gemcitabine ;  Surgeon: Dustin Gimenez, MD;  Location: [facility];  Service: Urology;  Laterality: N/A;   TRANSURETHRAL RESECTION OF BLADDER TUMOR WITH MITOMYCIN -C N/A 07/02/2019   Procedure: TRANSURETHRAL RESECTION OF BLADDER TUMOR WITH Gemcitabine ;  Surgeon: Dustin Gimenez, MD;  Location: [facility];  Service: Urology;  Laterality: N/A;   TRANSURETHRAL RESECTION OF BLADDER TUMOR WITH MITOMYCIN -C N/A 12/21/2019   Procedure: TRANSURETHRAL RESECTION OF BLADDER TUMOR WITH Gemcitabine ;  Surgeon: Dustin Gimenez, MD;  Location: [facility];  Service: Urology;  Laterality: N/A;   TRANSURETHRAL RESECTION OF BLADDER TUMOR WITH MITOMYCIN -C N/A 02/01/2020   Procedure: TRANSURETHRAL RESECTION OF BLADDER TUMOR WITH Gemcitabine ;  Surgeon: Dustin Gimenez, MD;  Location: [facility];  Service: Urology;  Laterality: N/A;    No Known Allergies  Objective/Physical Exam Neurovascular status intact.  Incision well coapted with staples intact. No sign of infectious process noted. No dehiscence. No active bleeding noted.  Moderate edema noted to the surgical extremity.  Radiographic Exam RT ankle 04/26/2023:  Orthopedic hardware appears to  be stable with routine healing.  Fracture fragments reduced and in good anatomical alignment.  Congruent tibiotalar joint.  Assessment: 1. s/p ORIF RT ankle. DOS: 04/19/2023   Plan of Care:  -Patient was evaluated. -Staples  removed -Continue Percocet 5/325 mg as needed pain -Return to clinic 2 weeks follow-up x-ray   Dot Gazella, DPM [facility]  Dr. Dot Gazella, DPM    [address] [place], [place] [address]                Office [phone]  Fax [phone]

## 2023-06-18 ENCOUNTER — Encounter: Payer: Self-pay | Admitting: Podiatry

## 2023-06-18 ENCOUNTER — Ambulatory Visit (INDEPENDENT_AMBULATORY_CARE_PROVIDER_SITE_OTHER)

## 2023-06-18 ENCOUNTER — Ambulatory Visit (INDEPENDENT_AMBULATORY_CARE_PROVIDER_SITE_OTHER): Admitting: Podiatry

## 2023-06-18 DIAGNOSIS — S82851A Displaced trimalleolar fracture of right lower leg, initial encounter for closed fracture: Secondary | ICD-10-CM

## 2023-06-18 NOTE — Progress Notes (Signed)
 Chief Complaint  Patient presents with   Routine Post Op    POV # 4 DOS 04/19/23 --- ORIF RIGHT ANKLE FRACTURE "It's doing so much better."    Subjective:  Patient presents today status post ORIF right ankle.  DOS: 04/19/2023.  Patient has been doing very well.  She says that she has been applying some pressure on the foot while she is wearing the cam boot.  No acute pain or tenderness.  Past Medical History:  Diagnosis Date   Anxiety    Cancer (HCC)    bladder   GERD (gastroesophageal reflux disease)    History of kidney stones    Lower abdominal pain    Rheumatoid arthritis (HCC)    Urinary tract infection symptoms     Past Surgical History:  Procedure Laterality Date   ABDOMINAL HYSTERECTOMY     CESAREAN SECTION  1982   CHOLECYSTECTOMY     CYSTOSCOPY W/ RETROGRADES Bilateral 07/02/2019   Procedure: CYSTOSCOPY WITH RETROGRADE PYELOGRAM;  Surgeon: Dustin Gimenez, MD;  Location: [facility];  Service: Urology;  Laterality: Bilateral;   CYSTOSCOPY W/ RETROGRADES Bilateral 12/21/2019   Procedure: CYSTOSCOPY WITH RETROGRADE PYELOGRAM;  Surgeon: Dustin Gimenez, MD;  Location: [facility];  Service: Urology;  Laterality: Bilateral;   CYSTOSCOPY WITH BIOPSY N/A 12/22/2018   Procedure: CYSTOSCOPY WITH BIOPSY;  Surgeon: Dustin Gimenez, MD;  Location: [facility];  Service: Urology;  Laterality: N/A;   CYSTOSCOPY WITH INJECTION N/A 04/08/2020   Procedure: CYSTOSCOPY WITH INJECTION OF INDOCYANINE GREEN DYE;  Surgeon: Osborn Blaze, MD;  Location: [facility];  Service: Urology;  Laterality: N/A;   IR NEPHROSTOMY PLACEMENT LEFT  10/06/2021   LYMPHADENECTOMY Bilateral 04/08/2020   Procedure: LYMPHADENECTOMY;  Surgeon: Osborn Blaze, MD;  Location: [facility];  Service: Urology;  Laterality: Bilateral;   ORIF ANKLE FRACTURE Right 04/19/2023   Procedure: OPEN REDUCTION INTERNAL FIXATION (ORIF) ANKLE FRACTURE;  Surgeon: Dot Gazella, DPM;  Location: [facility];  Service: Orthopedics/Podiatry;   Laterality: Right;   REVISION UROSTOMY CUTANEOUS Right    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   ROBOT ASSISTED LAPAROSCOPIC COMPLETE CYSTECT ILEAL CONDUIT N/A 04/08/2020   Procedure: XI ROBOTIC ASSISTED LAPAROSCOPIC COMPLETE CYSTECT , TOTAL ABDOMINAL HYSTERECTOMY, BILATERAL SALPINGO-OOPHORECTOMY AND ILEAL CONDUIT;  Surgeon: Osborn Blaze, MD;  Location: [facility];  Service: Urology;  Laterality: N/A;  6 HRS   TRANSURETHRAL RESECTION OF BLADDER TUMOR WITH MITOMYCIN -C N/A 12/22/2018   Procedure: TRANSURETHRAL RESECTION OF BLADDER TUMOR WITH Gemcitabine ;  Surgeon: Dustin Gimenez, MD;  Location: [facility];  Service: Urology;  Laterality: N/A;   TRANSURETHRAL RESECTION OF BLADDER TUMOR WITH MITOMYCIN -C N/A 07/02/2019   Procedure: TRANSURETHRAL RESECTION OF BLADDER TUMOR WITH Gemcitabine ;  Surgeon: Dustin Gimenez, MD;  Location: [facility];  Service: Urology;  Laterality: N/A;   TRANSURETHRAL RESECTION OF BLADDER TUMOR WITH MITOMYCIN -C N/A 12/21/2019   Procedure: TRANSURETHRAL RESECTION OF BLADDER TUMOR WITH Gemcitabine ;  Surgeon: Dustin Gimenez, MD;  Location: [facility];  Service: Urology;  Laterality: N/A;   TRANSURETHRAL RESECTION OF BLADDER TUMOR WITH MITOMYCIN -C N/A 02/01/2020   Procedure: TRANSURETHRAL RESECTION OF BLADDER TUMOR WITH Gemcitabine ;  Surgeon: Dustin Gimenez, MD;  Location: [facility];  Service: Urology;  Laterality: N/A;    No Known Allergies  Objective/Physical Exam Neurovascular status intact.  All incisions are nicely healed.  No edema noted.  Range of motion to the ankle slightly limited.  No crepitus.  No pain with range of motion  Radiographic Exam RT ankle 06/18/2023:  Routine healing noted.  Orthopedic hardware appears to be stable with routine healing.  Congruent tibiotalar joint.  Assessment: 1. s/p ORIF RT ankle. DOS: 04/19/2023   Plan of Care:  -Patient was evaluated.  - Discontinue knee scooter.  WBAT CAM boot over the next 2-3 weeks.  Then the patient may begin to  transition out of the cam boot into good supportive tennis shoes and sneakers -Order placed for physical therapy at [facility] -Return to clinic 8 weeks follow-up x-ray   Dot Gazella, DPM [facility]  Dr. Dot Gazella, DPM    [address] [place], [place] [address]                Office [phone]  Fax [phone]

## 2023-08-13 ENCOUNTER — Ambulatory Visit (INDEPENDENT_AMBULATORY_CARE_PROVIDER_SITE_OTHER): Admitting: Podiatry

## 2023-08-13 ENCOUNTER — Ambulatory Visit (INDEPENDENT_AMBULATORY_CARE_PROVIDER_SITE_OTHER)

## 2023-08-13 VITALS — Ht 64.0 in | Wt 157.0 lb

## 2023-08-13 DIAGNOSIS — S82851A Displaced trimalleolar fracture of right lower leg, initial encounter for closed fracture: Secondary | ICD-10-CM

## 2023-08-13 NOTE — Progress Notes (Signed)
 Chief Complaint  Patient presents with   Routine Post Op     POV # 5 DOS 04/19/23 --- ORIF RIGHT ANKLE FRACTURE, pt is here to f/u on right ankle due to fracture she states everything is going well has no pain at this time, no other complaints.      Subjective:  Patient presents today status post ORIF right ankle.  DOS: 04/19/2023.  Continues to do well.  She has essentially full activity in tennis shoes.  She has completed physical therapy.  Past Medical History:  Diagnosis Date   Anxiety    Cancer (HCC)    bladder   GERD (gastroesophageal reflux disease)    History of kidney stones    Lower abdominal pain    Rheumatoid arthritis (HCC)    Urinary tract infection symptoms     Past Surgical History:  Procedure Laterality Date   ABDOMINAL HYSTERECTOMY     CESAREAN SECTION  1982   CHOLECYSTECTOMY     CYSTOSCOPY W/ RETROGRADES Bilateral 07/02/2019   Procedure: CYSTOSCOPY WITH RETROGRADE PYELOGRAM;  Surgeon: Penne Knee, MD;  Location: [facility];  Service: Urology;  Laterality: Bilateral;   CYSTOSCOPY W/ RETROGRADES Bilateral 12/21/2019   Procedure: CYSTOSCOPY WITH RETROGRADE PYELOGRAM;  Surgeon: Penne Knee, MD;  Location: [facility];  Service: Urology;  Laterality: Bilateral;   CYSTOSCOPY WITH BIOPSY N/A 12/22/2018   Procedure: CYSTOSCOPY WITH BIOPSY;  Surgeon: Penne Knee, MD;  Location: [facility];  Service: Urology;  Laterality: N/A;   CYSTOSCOPY WITH INJECTION N/A 04/08/2020   Procedure: CYSTOSCOPY WITH INJECTION OF INDOCYANINE GREEN DYE;  Surgeon: Alvaro Hummer, MD;  Location: [facility];  Service: Urology;  Laterality: N/A;   IR NEPHROSTOMY PLACEMENT LEFT  10/06/2021   LYMPHADENECTOMY Bilateral 04/08/2020   Procedure: LYMPHADENECTOMY;  Surgeon: Alvaro Hummer, MD;  Location: [facility];  Service: Urology;  Laterality: Bilateral;   ORIF ANKLE FRACTURE Right 04/19/2023   Procedure: OPEN REDUCTION INTERNAL FIXATION (ORIF) ANKLE FRACTURE;  Surgeon: Janit Thresa HERO, DPM;   Location: [facility];  Service: Orthopedics/Podiatry;  Laterality: Right;   REVISION UROSTOMY CUTANEOUS Right    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   ROBOT ASSISTED LAPAROSCOPIC COMPLETE CYSTECT ILEAL CONDUIT N/A 04/08/2020   Procedure: XI ROBOTIC ASSISTED LAPAROSCOPIC COMPLETE CYSTECT , TOTAL ABDOMINAL HYSTERECTOMY, BILATERAL SALPINGO-OOPHORECTOMY AND ILEAL CONDUIT;  Surgeon: Alvaro Hummer, MD;  Location: [facility];  Service: Urology;  Laterality: N/A;  6 HRS   TRANSURETHRAL RESECTION OF BLADDER TUMOR WITH MITOMYCIN -C N/A 12/22/2018   Procedure: TRANSURETHRAL RESECTION OF BLADDER TUMOR WITH Gemcitabine ;  Surgeon: Penne Knee, MD;  Location: [facility];  Service: Urology;  Laterality: N/A;   TRANSURETHRAL RESECTION OF BLADDER TUMOR WITH MITOMYCIN -C N/A 07/02/2019   Procedure: TRANSURETHRAL RESECTION OF BLADDER TUMOR WITH Gemcitabine ;  Surgeon: Penne Knee, MD;  Location: [facility];  Service: Urology;  Laterality: N/A;   TRANSURETHRAL RESECTION OF BLADDER TUMOR WITH MITOMYCIN -C N/A 12/21/2019   Procedure: TRANSURETHRAL RESECTION OF BLADDER TUMOR WITH Gemcitabine ;  Surgeon: Penne Knee, MD;  Location: [facility];  Service: Urology;  Laterality: N/A;   TRANSURETHRAL RESECTION OF BLADDER TUMOR WITH MITOMYCIN -C N/A 02/01/2020   Procedure: TRANSURETHRAL RESECTION OF BLADDER TUMOR WITH Gemcitabine ;  Surgeon: Penne Knee, MD;  Location: [facility];  Service: Urology;  Laterality: N/A;    No Known Allergies  Objective/Physical Exam Neurovascular status intact.  All incisions are nicely healed.  No edema noted.  Good range of motion to the ankle joint.  No crepitus.  Radiographic Exam RT ankle 08/13/2023:  Routine healing noted.  Orthopedic hardware appears to be stable with routine healing.  Congruent tibiotalar joint.  Assessment: 1. s/p ORIF RT ankle. DOS: 04/19/2023   Plan of Care:  -Patient was evaluated.  - X-rays reviewed -Patient may now resume full activity no restrictions -Recommend  good supportive tennis shoes and sneakers -Return to clinic PRN   Thresa EMERSON Sar, DPM [facility]  Dr. Thresa EMERSON Sar, DPM    [address] [place], [place] [address]                Office [phone]  Fax [phone]
